# Patient Record
Sex: Male | Born: 1945 | Race: White | Hispanic: No | Marital: Married | Smoking: Former smoker
Health system: Southern US, Community
[De-identification: ages and names within clinical notes are randomized; demographics above are authoritative.]

## PROBLEM LIST (undated history)

## (undated) DIAGNOSIS — I639 Cerebral infarction, unspecified: Secondary | ICD-10-CM

## (undated) DIAGNOSIS — Z87891 Personal history of nicotine dependence: Secondary | ICD-10-CM

## (undated) DIAGNOSIS — I471 Supraventricular tachycardia, unspecified: Secondary | ICD-10-CM

## (undated) DIAGNOSIS — I10 Essential (primary) hypertension: Secondary | ICD-10-CM

## (undated) DIAGNOSIS — R001 Bradycardia, unspecified: Secondary | ICD-10-CM

## (undated) HISTORY — DX: Supraventricular tachycardia: I47.1

## (undated) HISTORY — DX: Essential (primary) hypertension: I10

## (undated) HISTORY — PX: COLONOSCOPY: SHX174

## (undated) HISTORY — PX: TONSILLECTOMY: SUR1361

## (undated) HISTORY — DX: Supraventricular tachycardia, unspecified: I47.10

## (undated) HISTORY — DX: Personal history of nicotine dependence: Z87.891

## (undated) HISTORY — DX: Bradycardia, unspecified: R00.1

---

## 1997-12-29 ENCOUNTER — Ambulatory Visit (HOSPITAL_COMMUNITY): Admission: RE | Admit: 1997-12-29 | Discharge: 1997-12-29 | Payer: Self-pay | Admitting: *Deleted

## 2003-10-23 ENCOUNTER — Ambulatory Visit (HOSPITAL_COMMUNITY): Admission: RE | Admit: 2003-10-23 | Discharge: 2003-10-23 | Payer: Self-pay | Admitting: Internal Medicine

## 2004-05-23 ENCOUNTER — Ambulatory Visit: Payer: Self-pay | Admitting: Family Medicine

## 2004-10-10 ENCOUNTER — Ambulatory Visit: Payer: Self-pay | Admitting: Family Medicine

## 2006-01-02 ENCOUNTER — Emergency Department (HOSPITAL_COMMUNITY): Admission: EM | Admit: 2006-01-02 | Discharge: 2006-01-02 | Payer: Self-pay | Admitting: Emergency Medicine

## 2006-01-05 ENCOUNTER — Ambulatory Visit (HOSPITAL_COMMUNITY): Payer: Self-pay | Admitting: Emergency Medicine

## 2006-01-05 ENCOUNTER — Encounter (HOSPITAL_COMMUNITY): Admission: RE | Admit: 2006-01-05 | Discharge: 2006-02-04 | Payer: Self-pay | Admitting: Emergency Medicine

## 2009-12-31 ENCOUNTER — Encounter: Payer: Self-pay | Admitting: Internal Medicine

## 2010-01-25 ENCOUNTER — Ambulatory Visit (HOSPITAL_COMMUNITY): Admission: RE | Admit: 2010-01-25 | Discharge: 2010-01-25 | Payer: Self-pay | Admitting: Internal Medicine

## 2010-01-25 ENCOUNTER — Ambulatory Visit: Payer: Self-pay | Admitting: Internal Medicine

## 2010-01-31 ENCOUNTER — Encounter: Payer: Self-pay | Admitting: Internal Medicine

## 2010-08-20 NOTE — Letter (Signed)
Summary: Patient Notice, Colon Biopsy Results  [facility]  [address]   [place], [place] [address]   Phone: [phone]  Fax: [phone]       January 31, 2010   Stephen Hull [address] [place], [place]  [address] [birth year]    Dear Stephen Hull,  I am pleased to inform you that the biopsies taken during your recent colonoscopy did not show any evidence of cancer upon pathologic examination.  Additional information/recommendations:  You should have a repeat colonoscopy examination  in 5 years.  Please call us if you are having persistent problems or have questions about your condition that have not been fully answered at this time.  Sincerely,    R. Roetta Sessions MD, FACP [facility] Ph: [phone]    Fax: [phone]   Appended Document: Patient Notice, Colon Biopsy Results Letter mailed to pt.  Appended Document: Patient Notice, Colon Biopsy Results reminder appt made and op & path faxed   Appended Document: Patient Notice, Colon Biopsy Results reminder in computer

## 2010-08-20 NOTE — Letter (Signed)
Summary: TRIAGE ORDER  TRIAGE ORDER   Imported By: Ave Filter 12/31/2009 12:03:53  _____________________________________________________________________  External Attachment:    Type:   Image     Comment:   External Document

## 2010-12-06 NOTE — Op Note (Signed)
NAME:  Stephen Hull, Stephen Hull                       ACCOUNT NO.:  [id number]   MEDICAL RECORD NO.:  [id number]                   PATIENT TYPE:  AMB   LOCATION:  DAY                                  FACILITY:  [facility]   PHYSICIAN:  Lionel December, M.D.                 DATE OF BIRTH:  30-Nov-1945   DATE OF PROCEDURE:  10/23/2003  DATE OF DISCHARGE:                                 OPERATIVE REPORT   PROCEDURE:  Total colonoscopy.   INDICATIONS FOR PROCEDURE:  Stephen Hull is a 65 year old Caucasian male with  history of colonic polyps whose last exam was about five and a half years  ago in [place].  He had a single polyp removed.  He is returning for  surveillance colonoscopy.  Family history is negative for colorectal  carcinoma, but his brother had a large adenoma removed from his sigmoid  colon on screening colonoscopy.  The procedure and risks were reviewed with  the patient, and informed consent was obtained.   PREOPERATIVE MEDICATIONS:  Demerol 50 mg IV, Versed 5 mg IV in divided dose.   FINDINGS:  The procedure was performed in the endoscopy suite.  The  patient's vital signs and O2 saturations were monitored during the procedure  and remained stable.  The patient was placed in the left lateral recumbent  position and rectal examination performed.  No abnormality noted on external  or digital exam.  The Olympus videoscope was placed into the rectum and  advanced into the region of the sigmoid colon and beyond.  The preparation  was satisfactory.  The scope was passed to the cecum which was identified by  the appendiceal orifice and ileocecal valve.  Pictures were taken for the  record.  There was a tiny polyp at the cecum which was ablated by cold  biopsy.  The mucosa of the rest of the colon was normal, but the rectal  mucosa had another small polyp which was ablated by cold biopsy.  Both of  these polyps were submitted in one container.  The scope was retroflexed to  examine the anorectal  junction, and moderate-size hemorrhoids were noted  below the dentate line.  The endoscope was straightened and withdrawn.  The  patient tolerated the procedure well.   FINAL DIAGNOSES:  1. Two tiny polyps that were ablated by cold biopsy.  One was at the cecum     and another one at the rectum.  2. External hemorrhoids.   RECOMMENDATIONS:  1. I will be contacting the patient with the biopsy results.  2. He may resume ASA and other medications as before.  3. Citrucel one tablespoon full at bedtime which might help with his     constipation.  Otherwise, he may take Colace one to two tablets at     bedtime.      ___________________________________________  Lionel December, M.D.   NR/MEDQ  D:  10/23/2003  T:  10/23/2003  Job:  [id number]   cc:   Colon Flattery, D.O.  [address]  [place]  [place] [address]  Fax: [phone]

## 2012-04-10 ENCOUNTER — Other Ambulatory Visit: Payer: Self-pay

## 2015-01-02 ENCOUNTER — Encounter: Payer: Self-pay | Admitting: Internal Medicine

## 2015-06-26 ENCOUNTER — Ambulatory Visit: Payer: Medicare Other | Attending: Family Medicine | Admitting: Physical Therapy

## 2015-06-26 DIAGNOSIS — M791 Myalgia: Secondary | ICD-10-CM | POA: Insufficient documentation

## 2015-06-26 DIAGNOSIS — M79651 Pain in right thigh: Secondary | ICD-10-CM | POA: Diagnosis present

## 2015-06-26 DIAGNOSIS — M7918 Myalgia, other site: Secondary | ICD-10-CM

## 2015-06-26 NOTE — Therapy (Addendum)
[facility] [address] [place], [place], [address] Phone: [phone]   Fax:  [phone]  Physical Therapy Evaluation  Patient Details  Name: Stephen Hull MRN: [id number] Date of Birth: 08/08/1945 Referring Provider: Brent Burnett MD.  Encounter Date: 06/26/2015      PT End of Session - 06/26/15 1157    Visit Number 1   Number of Visits 12   Date for PT Re-Evaluation 08/07/15   PT Start Time 1115   PT Stop Time 1147   PT Time Calculation (min) 32 min   Activity Tolerance Patient tolerated treatment well;Patient limited by pain   Behavior During Therapy WFL for tasks assessed/performed      No past medical history on file.  No past surgical history on file.  There were no vitals filed for this visit.  Visit Diagnosis:  Right buttock pain - Plan: PT plan of care cert/re-cert  Right thigh pain - Plan: PT plan of care cert/re-cert      Subjective Assessment - 06/26/15 1141    Subjective My right buttock area hurts and my right foot goes numb.   Limitations Sitting   How long can you sit comfortably? 5-10 minutes.   Patient Stated Goals Get out of pain.  Get relief.   Currently in Pain? Yes   Pain Score 6    Pain Location Buttocks   Pain Orientation Right   Pain Descriptors / Indicators Aching;Throbbing   Pain Onset More than a month ago   Pain Frequency Constant   Aggravating Factors  Sitting.   Pain Relieving Factors Rest--lying down.            [facility] PT Assessment - 06/26/15 0001    Assessment   Medical Diagnosis Right leg pain.   Referring Provider Brent Burnett MD.   Onset Date/Surgical Date --  6 weeks.   Precautions   Precautions None   Restrictions   Weight Bearing Restrictions No   Balance Screen   Has the patient fallen in the past 6 months No   Has the patient had a decrease in activity level because of a fear of falling?  No   Is the patient reluctant to leave their home because of a  fear of falling?  No   Home Environment   Living Environment Private residence   Prior Function   Level of Independence Independent   Posture/Postural Control   Posture Comments Patient in obvious pain standing in some spinal flexion.   ROM / Strength   AROM / PROM / Strength AROM;Strength   AROM   Overall AROM Comments Spinal movements and transitory movements were performed very slowly and purposefully due to high pain-level.   Strength   Overall Strength Comments Normal LE strength.   Palpation   Palpation comment Patient c/o right buttock pain but it could not be palpated.  This quite possibly could be referred pain from the patient's lower lumbar region.   Special Tests    Special Tests Lumbar  Unable to elicit RT Achilles DTR after several attempts.   Lumbar Tests Straight Leg Raise   Straight Leg Raise   Findings Positive   Side  Right   Comment The patient c/o right foot numbness with a right SLR test.   Ambulation/Gait   Gait Comments The patient's gait is very antalgic with patient keeping right knee in full extension.                                  PT Long Term Goals - 06/26/15 1157    PT LONG TERM GOAL #1   Title Ind with HEP.   Time 6   Period Weeks   Status New   PT LONG TERM GOAL #2   Title Patient sit 30 minutes with pain not > 3/10.   Time 6   Period Weeks   Status New   PT LONG TERM GOAL #3   Title Eliminate right LE symptoms.   Time 6   Period Weeks   Status New   PT LONG TERM GOAL #4   Title Perform ADL's with pain not > 3/10.   Time 6   Period Weeks   Status New               Plan - 06/26/15 1144    Clinical Impression Statement The patient presents to outpatient physical therapy with a CC of right buttock pain.  He states that the pain will radiate down his right LE and the bottom of his foot goes numb.  His resting pain-level is a 6/10 but his pain can approach a 10/10 after prolonged sitting.  He has not had  an X-ray or an MRI at this point.  patient was about about any spinal pain.  He denies low back pain or injury to this area though he did see a chiropractor many years ago.   Pt will benefit from skilled therapeutic intervention in order to improve on the following deficits Decreased activity tolerance;Pain   Rehab Potential Fair   PT Frequency 2x / week   PT Duration 6 weeks   PT Treatment/Interventions ADLs/Self Care Home Management;Electrical Stimulation;Moist Heat;Therapeutic exercise;Therapeutic activities;Traction;Ultrasound;Manual techniques   PT Next Visit Plan Discussed with patient that per assessment today his symptoms may likely be the result of a lower lumbar disorder.  Recommend he have further testing (ie:  X-ray; MRI) prior to commencing with physical therapy ie:  Spinal traction.          G-Codes - 06/26/15 1143    Functional Assessment Tool Used FOTO.   Functional Limitation Mobility: Walking and moving around   Mobility: Walking and Moving Around Current Status (G8978) At least 60 percent but less than 80 percent impaired, limited or restricted   Mobility: Walking and Moving Around Goal Status (G8979) At least 20 percent but less than 40 percent impaired, limited or restricted       Problem List There are no active problems to display for this patient.  PHYSICAL THERAPY DISCHARGE SUMMARY  Visits from Start of Care: 1.  Current functional level related to goals / functional outcomes: See above.   Remaining deficits: See below.   Education / Equipment:   Plan: Patient agrees to discharge.  Patient goals were not met. Patient is being discharged due to not returning since the last visit.  ?????      APPLEGATE, CHAD MPT 06/26/2015, 12:04 PM  [facility] [address] [place], [place], [address] Phone: [phone]   Fax:  [phone]  Name: Stephen Hull MRN: [id number] Date of Birth: 12/02/1945    

## 2016-07-07 ENCOUNTER — Ambulatory Visit: Payer: Medicare Other | Admitting: Cardiology

## 2016-07-23 ENCOUNTER — Encounter: Payer: Self-pay | Admitting: Cardiovascular Disease

## 2016-07-23 ENCOUNTER — Ambulatory Visit (INDEPENDENT_AMBULATORY_CARE_PROVIDER_SITE_OTHER): Payer: Medicare Other | Admitting: Cardiovascular Disease

## 2016-07-23 VITALS — BP 140/100 | HR 60 | Ht 71.0 in | Wt 236.0 lb

## 2016-07-23 DIAGNOSIS — R001 Bradycardia, unspecified: Secondary | ICD-10-CM | POA: Diagnosis not present

## 2016-07-23 DIAGNOSIS — I471 Supraventricular tachycardia: Secondary | ICD-10-CM | POA: Insufficient documentation

## 2016-07-23 DIAGNOSIS — I10 Essential (primary) hypertension: Secondary | ICD-10-CM | POA: Insufficient documentation

## 2016-07-23 NOTE — Assessment & Plan Note (Signed)
Asymptomatic bradycardia on Sectral. His heart rate during his recent PCP office visit was 36 although he was a symptomatic from this. He checks his vital signs at home frequently and has not noticed bradycardia. Owing to keep him on his beta blocker.

## 2016-07-23 NOTE — Assessment & Plan Note (Signed)
History of hypertension with blood pressure measured 140/100. He is on Sectral and Cozaar. He takes his blood pressure, and it's usually much less and less. Continue current meds at current dosing

## 2016-07-23 NOTE — Assessment & Plan Note (Signed)
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 He has not had symptomatic PSVT since.

## 2016-07-23 NOTE — Patient Instructions (Signed)

## 2016-07-23 NOTE — Progress Notes (Signed)
07/23/2016 [name]   1946/03/19  [id number]  Primary Physician No primary care provider on file. Primary Cardiologist: Runell Gess MD [name]  HPI:  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 He was referred by [name] PA-C for a symptomatically bradycardia. I last saw him in 1991 for PSVT. At that time I performed cardiac catheterization which was apparently normal and placed him on Sectral, beta blocker. I have not seen him back since. He does have a history of hypertension. He quit smoking in 1991 when I suggested that. He's lost 35 pounds in the last 6 months because of back problems. He denies chest pain or shortness of breath. He was noted to have a symptomatic bradycardia with a heart rate of 36 during a recent office visit with his PCP.   Current Outpatient Prescriptions  Medication Sig Dispense Refill  . acebutolol (SECTRAL) 200 MG capsule Take 1 capsule by mouth 2 (two) times daily.    [name] losartan (COZAAR) 50 MG tablet Take 1 tablet by mouth daily.    [name] omeprazole (PRILOSEC) 40 MG capsule Take 1 capsule by mouth daily.     No current facility-administered medications for this visit.     No Known Allergies  Social History   Social History  . Marital status: Married    Spouse name: N/A  . Number of children: N/A  . Years of education: N/A   Occupational History  . Not on file.   Social History Main Topics  . Smoking status: Former Smoker    Types: Cigarettes    Quit date: 07/21/1989  . Smokeless tobacco: Former Neurosurgeon    Quit date: 07/21/1989  . Alcohol use No  . Drug use: No  . Sexual activity: Not on file   Other Topics Concern  . Not on file   Social History Narrative  . No narrative on file     Review of Systems: General: negative for chills, fever, night sweats or weight  changes.  Cardiovascular: negative for chest pain, dyspnea on exertion, edema, orthopnea, palpitations, paroxysmal nocturnal dyspnea or shortness of breath Dermatological: negative for rash Respiratory: negative for cough or wheezing Urologic: negative for hematuria Abdominal: negative for nausea, vomiting, diarrhea, bright red blood per rectum, melena, or hematemesis Neurologic: negative for visual changes, syncope, or dizziness All other systems reviewed and are otherwise negative except as noted above.    Blood pressure (!) 140/100, pulse 60, height 5\' 11"  (1.803 m), weight 236 lb (107 kg).  General appearance: alert and no distress Neck: no adenopathy, no carotid bruit, no JVD, supple, symmetrical, trachea midline and thyroid not enlarged, symmetric, no tenderness/mass/nodules Lungs: clear to auscultation bilaterally Heart: regular rate and rhythm, S1, S2 normal, no murmur, click, rub or gallop Extremities: extremities normal, atraumatic, no cyanosis or edema  EKG sinus rhythm at 60 with ST or T-wave changes. I personally reviewed this EKG  ASSESSMENT AND PLAN:   Essential hypertension History of hypertension with blood pressure measured 140/100. He is on Sectral and Cozaar. He takes his blood pressure, and it's usually much less and less. 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 He has not had symptomatic PSVT since.  Bradycardia Asymptomatic bradycardia on Sectral. His heart  rate during his recent PCP office visit was 36 although he was a symptomatic from this. He checks his vital signs at home frequently and has not noticed bradycardia. Owing to keep him on his beta blocker.      Runell GessJonathan J. Shrinika Blatz MD FACP,FACC,FAHA, [facility] 07/23/2016 10:10 AM

## 2016-09-01 ENCOUNTER — Telehealth: Payer: Self-pay | Admitting: Internal Medicine

## 2016-09-01 NOTE — Telephone Encounter (Signed)
Pt called to set up colonoscopy. He was due in 2016. He isn't having any GI problems and with his insurance he can be triaged. Please call him at [phone]

## 2016-09-09 ENCOUNTER — Telehealth: Payer: Self-pay

## 2016-09-09 NOTE — Telephone Encounter (Signed)
Triaged today, but pt would like a call back when April schedule is available.

## 2016-09-09 NOTE — Telephone Encounter (Signed)
Call pt when April schedule is available.

## 2016-09-23 NOTE — Telephone Encounter (Signed)
Gastroenterology Pre-Procedure Review  Request Date: Requesting Physician:   PATIENT REVIEW QUESTIONS: The patient responded to the following health history questions as indicated:    1. Diabetes Melitis: NO 2. Joint replacements in the past 12 months: NO 3. Major health problems in the past 3 months: NO 4. Has an artificial valve or MVP: NO 5. Has a defibrillator: NO 6. Has been advised in past to take antibiotics in advance of a procedure like teeth cleaning: NO 7. Family history of colon cancer: NO  8. Alcohol Use: NO 9. History of sleep apnea: NO 10. History of coronary artery or other vascular stents placed within the last 12 months: NO    MEDICATIONS & ALLERGIES:    Patient reports the following regarding taking any blood thinners:   Plavix? NO Aspirin? NO Coumadin? NO Brilinta? NO Xarelto? NO Eliquis? NO Pradaxa? NO Savaysa? NO Effient? NO  Patient confirms/reports the following medications:  Current Outpatient Prescriptions  Medication Sig Dispense Refill  . acebutolol (SECTRAL) 200 MG capsule Take 1 capsule by mouth 2 (two) times daily.    [name]. losartan (COZAAR) 50 MG tablet Take 1 tablet by mouth daily.    [name]. omeprazole (PRILOSEC) 40 MG capsule Take 1 capsule by mouth daily.     No current facility-administered medications for this visit.     Patient confirms/reports the following allergies:  No Known Allergies  No orders of the defined types were placed in this encounter.   AUTHORIZATION INFORMATION Primary Insurance: [employer] [facility]  ID #: [id number],  Group #:  Pre-Cert / Auth required:  Pre-Cert / Auth #:   Secondary Insurance: MEDICARE,  ID #: [id number],  Group #:  Pre-Cert / Auth required:  Pre-Cert / Auth #:   SCHEDULE INFORMATION: Procedure has been scheduled as follows:  Date: , Time:   Location:    This Gastroenterology Pre-Precedure Review Form is being routed to the following provider(s): R. Roetta SessionsMichael Rourk, MD

## 2016-09-23 NOTE — Telephone Encounter (Signed)
LMOM to call back

## 2016-09-24 ENCOUNTER — Ambulatory Visit: Payer: Medicare Other | Admitting: Gastroenterology

## 2016-09-24 ENCOUNTER — Other Ambulatory Visit: Payer: Self-pay

## 2016-09-24 DIAGNOSIS — Z86010 Personal history of colonic polyps: Secondary | ICD-10-CM

## 2016-09-24 MED ORDER — PEG 3350-KCL-NA BICARB-NACL 420 G PO SOLR: 4000.0000 mL | ORAL | 0 refills | Status: DC

## 2016-09-24 NOTE — Telephone Encounter (Signed)
Appropriate.

## 2016-09-24 NOTE — Telephone Encounter (Addendum)
Pt is set up for TCS on 11/14/16 @ 7:30 am/ Instructions are in the mail.He is aware. NO PA is needed.

## 2016-09-24 NOTE — Telephone Encounter (Signed)
Forwarding to Ginger who triaged.  

## 2016-11-11 ENCOUNTER — Telehealth: Payer: Self-pay

## 2016-11-11 NOTE — Telephone Encounter (Signed)
Noted. Appropriate. Thanks!

## 2016-11-11 NOTE — Telephone Encounter (Addendum)
No change in his information for up coming TCS.

## 2016-11-14 ENCOUNTER — Encounter (HOSPITAL_COMMUNITY): Payer: Self-pay | Admitting: *Deleted

## 2016-11-14 ENCOUNTER — Encounter (HOSPITAL_COMMUNITY)
Admission: RE | Disposition: A | Discharge status: Home / Self Care | Payer: Self-pay | Source: Ambulatory Visit | Attending: Internal Medicine

## 2016-11-14 ENCOUNTER — Ambulatory Visit (HOSPITAL_COMMUNITY)
Admission: RE | Admit: 2016-11-14 | Discharge: 2016-11-14 | Disposition: A | Discharge status: Home / Self Care | Payer: Medicare Other | Source: Ambulatory Visit | Attending: Internal Medicine | Admitting: Internal Medicine

## 2016-11-14 DIAGNOSIS — Z86010 Personal history of colonic polyps: Secondary | ICD-10-CM | POA: Diagnosis not present

## 2016-11-14 DIAGNOSIS — Z87891 Personal history of nicotine dependence: Secondary | ICD-10-CM | POA: Insufficient documentation

## 2016-11-14 DIAGNOSIS — Z1211 Encounter for screening for malignant neoplasm of colon: Secondary | ICD-10-CM | POA: Diagnosis not present

## 2016-11-14 DIAGNOSIS — Z79899 Other long term (current) drug therapy: Secondary | ICD-10-CM | POA: Insufficient documentation

## 2016-11-14 DIAGNOSIS — I10 Essential (primary) hypertension: Secondary | ICD-10-CM | POA: Insufficient documentation

## 2016-11-14 DIAGNOSIS — K5730 Diverticulosis of large intestine without perforation or abscess without bleeding: Secondary | ICD-10-CM | POA: Insufficient documentation

## 2016-11-14 HISTORY — PX: COLONOSCOPY: SHX5424

## 2016-11-14 SURGERY — COLONOSCOPY
Anesthesia: Moderate Sedation

## 2016-11-14 MED ORDER — MIDAZOLAM HCL 5 MG/5ML IJ SOLN
INTRAMUSCULAR | Status: DC | PRN
  Administered 2016-11-14: 1 mg via INTRAVENOUS
  Administered 2016-11-14 (×2): 2 mg via INTRAVENOUS
  Administered 2016-11-14: 1 mg via INTRAVENOUS

## 2016-11-14 MED ORDER — SODIUM CHLORIDE 0.9 % IV SOLN
INTRAVENOUS | Status: DC
  Administered 2016-11-14: 06:00:00 via INTRAVENOUS

## 2016-11-14 MED ORDER — ONDANSETRON HCL 4 MG/2ML IJ SOLN
INTRAMUSCULAR | Status: AC
  Filled 2016-11-14: qty 2

## 2016-11-14 MED ORDER — MIDAZOLAM HCL 5 MG/5ML IJ SOLN
INTRAMUSCULAR | Status: AC
  Filled 2016-11-14: qty 10

## 2016-11-14 MED ORDER — ONDANSETRON HCL 4 MG/2ML IJ SOLN
INTRAMUSCULAR | Status: DC | PRN
  Administered 2016-11-14: 4 mg via INTRAVENOUS

## 2016-11-14 MED ORDER — MEPERIDINE HCL 100 MG/ML IJ SOLN
INTRAMUSCULAR | Status: AC
  Filled 2016-11-14: qty 2

## 2016-11-14 MED ORDER — MEPERIDINE HCL 100 MG/ML IJ SOLN
INTRAMUSCULAR | Status: DC | PRN
  Administered 2016-11-14: 50 mg via INTRAVENOUS
  Administered 2016-11-14: 25 mg via INTRAVENOUS

## 2016-11-14 MED ORDER — STERILE WATER FOR IRRIGATION IR SOLN
Status: DC | PRN
  Administered 2016-11-14: 07:00:00

## 2016-11-14 NOTE — H&P (Signed)
 @   Primary Care Physician:  Lilia Argue Primary Gastroenterologist:  Dr. Jena Gauss  Pre-Procedure History & Physical: HPI:  Stephen Hull is a 71 y.o. male here for surveillance tcs; hx mx adenomas removed 2011. No bowel sx.  Past Medical History:  Diagnosis Date  . Hypertension     Past Surgical History:  Procedure Laterality Date  . COLONOSCOPY    . TONSILLECTOMY     as a kid    Prior to Admission medications   Medication Sig Start Date End Date Taking? Authorizing Provider  acebutolol (SECTRAL) 200 MG capsule Take 1 capsule by mouth 2 (two) times daily. 06/18/16  Yes Historical Provider, MD  Cholecalciferol (VITAMIN D3) 2000 units TABS Take 2,000 Units by mouth daily.   Yes Historical Provider, MD  dextromethorphan-guaiFENesin (MUCINEX DM) 30-600 MG 12hr tablet Take 1 tablet by mouth daily as needed for cough.   Yes Historical Provider, MD  ibuprofen (ADVIL,MOTRIN) 200 MG tablet Take 400 mg by mouth every 6 (six) hours as needed for mild pain.   Yes Historical Provider, MD  losartan (COZAAR) 50 MG tablet Take 1 tablet by mouth daily. 06/18/16  Yes Historical Provider, MD  omeprazole (PRILOSEC) 40 MG capsule Take 1 capsule by mouth daily. 06/18/16  Yes Historical Provider, MD  polyethylene glycol-electrolytes (TRILYTE) 420 g solution Take 4,000 mLs by mouth as directed. Patient taking differently: Take 4,000 mLs by mouth as directed.  09/24/16  Yes Corbin Ade, MD    Allergies as of 09/24/2016  . (No Known Allergies)    Family History  Problem Relation Age of Onset  . Cancer Mother     Stomach  . Cancer - Prostate Father   . Heart attack Father   . Colon cancer Neg Hx     Social History   Social History  . Marital status: Married    Spouse name: N/A  . Number of children: N/A  . Years of education: N/A   Occupational History  . Not on file.   Social History Main Topics  . Smoking status: Former Smoker    Types: Cigarettes    Quit date:  07/21/1989  . Smokeless tobacco: Former Neurosurgeon    Quit date: 07/21/1989  . Alcohol use No  . Drug use: No  . Sexual activity: Not on file   Other Topics Concern  . Not on file   Social History Narrative  . No narrative on file    Review of Systems: See HPI, otherwise negative ROS  Physical Exam: BP 129/90   Pulse 67   Temp 97.7 F (36.5 C) (Oral)   Resp (!) 22   Ht  (1.778 m)   Wt 223 lb (101.2 kg)   SpO2 98%   BMI 32.00 kg/m  General:   Alert,  Well-developed, well-nourished, pleasant and cooperative in NAD SNeck:  Supple; no masses or thyromegaly. No significant cervical adenopathy. Lungs:  Clear throughout to auscultation.   No wheezes, crackles, or rhonchi. No acute distress. Heart:  Regular rate and rhythm; no murmurs, clicks, rubs,  or gallops. Abdomen: Non-distended, normal bowel sounds.  Soft and nontender without appreciable mass or hepatosplenomegaly.  Pulses:  Normal pulses noted. Extremities:  Without clubbing or edema.  Impression/ Plan:   Hx colonic adenoma; here for TCS.  The risks, benefits, limitations, alternatives and imponderables have been reviewed with the patient. Questions have been answered. All parties are agreeable.      Notice: This dictation was prepared with Reubin Milan  dictation along with smaller phrase technology. Any transcriptional errors that result from this process are unintentional and may not be corrected upon review.

## 2016-11-14 NOTE — Discharge Instructions (Addendum)
Colonoscopy Discharge Instructions  Read the instructions outlined below and refer to this sheet in the next few weeks. These discharge instructions provide you with general information on caring for yourself after you leave the hospital. Your doctor may also give you specific instructions. While your treatment has been planned according to the most current medical practices available, unavoidable complications occasionally occur. If you have any problems or questions after discharge, call Dr. Jena Gauss at [phone]. ACTIVITY  You may resume your regular activity, but move at a slower pace for the next 24 hours.   Take frequent rest periods for the next 24 hours.   Walking will help get rid of the air and reduce the bloated feeling in your belly (abdomen).   No driving for 24 hours (because of the medicine (anesthesia) used during the test).    Do not sign any important legal documents or operate any machinery for 24 hours (because of the anesthesia used during the test).  NUTRITION  Drink plenty of fluids.   You may resume your normal diet as instructed by your doctor.   Begin with a light meal and progress to your normal diet. Heavy or fried foods are harder to digest and may make you feel sick to your stomach (nauseated).   Avoid alcoholic beverages for 24 hours or as instructed.  MEDICATIONS  You may resume your normal medications unless your doctor tells you otherwise.  WHAT YOU CAN EXPECT TODAY  Some feelings of bloating in the abdomen.   Passage of more gas than usual.   Spotting of blood in your stool or on the toilet paper.  IF YOU HAD POLYPS REMOVED DURING THE COLONOSCOPY:  No aspirin products for 7 days or as instructed.   No alcohol for 7 days or as instructed.   Eat a soft diet for the next 24 hours.  FINDING OUT THE RESULTS OF YOUR TEST Not all test results are available during your visit. If your test results are not back during the visit, make an appointment  with your caregiver to find out the results. Do not assume everything is normal if you have not heard from your caregiver or the medical facility. It is important for you to follow up on all of your test results.  SEEK IMMEDIATE MEDICAL ATTENTION IF:  You have more than a spotting of blood in your stool.   Your belly is swollen (abdominal distention).   You are nauseated or vomiting.   You have a temperature over 101.   You have abdominal pain or discomfort that is severe or gets worse throughout the day.    Colon diverticulosis information provided  Recommend one more colonoscopy in 7 years if overall health permits.   Diverticulosis Diverticulosis is a condition that develops when small pouches (diverticula) form in the wall of the large intestine (colon). The colon is where water is absorbed and stool is formed. The pouches form when the inside layer of the colon pushes through weak spots in the outer layers of the colon. You may have a few pouches or many of them. What are the causes? The cause of this condition is not known. What increases the risk? The following factors may make you more likely to develop this condition:  Being older than age 28. Your risk for this condition increases with age. Diverticulosis is rare among people younger than age 9. By age 69, many people have it.  Eating a low-fiber diet.  Having frequent constipation.  Being overweight.  Not getting enough exercise.  Smoking.  Taking over-the-counter pain medicines, like aspirin and ibuprofen.  Having a family history of diverticulosis. What are the signs or symptoms? In most people, there are no symptoms of this condition. If you do have symptoms, they may include:  Bloating.  Cramps in the abdomen.  Constipation or diarrhea.  Pain in the lower left side of the abdomen. How is this diagnosed? This condition is most often diagnosed during an exam for other colon problems. Because  diverticulosis usually has no symptoms, it often cannot be diagnosed independently. This condition may be diagnosed by:  Using a flexible scope to examine the colon (colonoscopy).  Taking an X-ray of the colon after dye has been put into the colon (barium enema).  Doing a CT scan. How is this treated? You may not need treatment for this condition if you have never developed an infection related to diverticulosis. If you have had an infection before, treatment may include:  Eating a high-fiber diet. This may include eating more fruits, vegetables, and grains.  Taking a fiber supplement.  Taking a live bacteria supplement (probiotic).  Taking medicine to relax your colon.  Taking antibiotic medicines. Follow these instructions at home:  Drink 6-8 glasses of water or more each day to prevent constipation.  Try not to strain when you have a bowel movement.  If you have had an infection before:  Eat more fiber as directed by your health care provider or your diet and nutrition specialist (dietitian).  Take a fiber supplement or probiotic, if your health care provider approves.  Take over-the-counter and prescription medicines only as told by your health care provider.  If you were prescribed an antibiotic, take it as told by your health care provider. Do not stop taking the antibiotic even if you start to feel better.  Keep all follow-up visits as told by your health care provider. This is important. Contact a health care provider if:  You have pain in your abdomen.  You have bloating.  You have cramps.  You have not had a bowel movement in 3 days. Get help right away if:  Your pain gets worse.  Your bloating becomes very bad.  You have a fever or chills, and your symptoms suddenly get worse.  You vomit.  You have bowel movements that are bloody or black.  You have bleeding from your rectum. Summary  Diverticulosis is a condition that develops when small pouches  (diverticula) form in the wall of the large intestine (colon).  You may have a few pouches or many of them.  This condition is most often diagnosed during an exam for other colon problems.  If you have had an infection related to diverticulosis, treatment may include increasing the fiber in your diet, taking supplements, or taking medicines. This information is not intended to replace advice given to you by your health care provider. Make sure you discuss any questions you have with your health care provider. Document Released: 04/03/2004 Document Revised: 05/26/2016 Document Reviewed: 05/26/2016 Elsevier Interactive Patient Education  2017 [employer].

## 2016-11-14 NOTE — Op Note (Signed)
[facility] Patient Name: Stephen Hull Procedure Date: 11/14/2016 6:43 AM MRN: [id number] Date of Birth: 07-Jun-1946 Attending MD: Gennette Pac , MD CSN: [id number] Age: 71 Admit Type: Outpatient Procedure:                Colonoscopy Indications:              High risk colon cancer surveillance: Personal                            history of colonic polyps Providers:                Gennette Pac, MD, Loma Messing B. Mathis Fare RN, RN,                            Burke Keels, Technician Referring MD:              Medicines:                Midazolam 6 mg IV, Meperidine 75 mg IV, Ondansetron                            4 mg IV Complications:            No immediate complications. Estimated Blood Loss:     Estimated blood loss: none. Procedure:                Pre-Anesthesia Assessment:                           - Prior to the procedure, a History and Physical                            was performed, and patient medications and                            allergies were reviewed. The patient's tolerance of                            previous anesthesia was also reviewed. The risks                            and benefits of the procedure and the sedation                            options and risks were discussed with the patient.                            All questions were answered, and informed consent                            was obtained. Prior Anticoagulants: The patient has                            taken no previous anticoagulant or antiplatelet  agents. ASA Grade Assessment: II - A patient with                            mild systemic disease. After reviewing the risks                            and benefits, the patient was deemed in                            satisfactory condition to undergo the procedure.                           After obtaining informed consent, the colonoscope                            was passed under direct  vision. Throughout the                            procedure, the patient's blood pressure, pulse, and                            oxygen saturations were monitored continuously. The                            EC-[id number] ([id number]) scope was introduced through                            the anus and advanced to the the cecum, identified                            by appendiceal orifice and ileocecal valve. The                            ileocecal valve, appendiceal orifice, and rectum                            were photographed. The entire colon was well                            visualized. The patient tolerated the procedure                            well. The quality of the bowel preparation was                            adequate. Scope In: 7:05:47 AM Scope Out: 7:15:51 AM Scope Withdrawal Time: 0 hours 7 minutes 54 seconds  Total Procedure Duration: 0 hours 10 minutes 4 seconds  Findings:      The perianal and digital rectal examinations were normal.      Scattered small and large-mouthed diverticula were found in the sigmoid       colon and descending colon.      The exam was otherwise without abnormality on direct and retroflexion       views. Impression:               -  Diverticulosis in the sigmoid colon and in the                            descending colon.                           - The examination was otherwise normal on direct                            and retroflexion views.                           - No specimens collected. Moderate Sedation:      Moderate (conscious) sedation was administered by the endoscopy nurse       and supervised by the endoscopist. The following parameters were       monitored: oxygen saturation, heart rate, blood pressure, respiratory       rate, EKG, adequacy of pulmonary ventilation, and response to care.       Total physician intraservice time was 15 minutes. Recommendation:           - Patient has a contact number available for                             emergencies. The signs and symptoms of potential                            delayed complications were discussed with the                            patient. Return to normal activities tomorrow.                            Written discharge instructions were provided to the                            patient.                           - Resume previous diet.                           - Continue present medications.                           - Repeat colonoscopy in 7 years for surveillance if                            overall health permits.                           - Return to GI clinic as needed. Procedure Code(s):        --- Professional ---                           [phone], Colonoscopy, flexible; diagnostic, including  collection of specimen(s) by brushing or washing,                            when performed (separate procedure)                           99152, Moderate sedation services provided by the                            same physician or other qualified health care                            professional performing the diagnostic or                            therapeutic service that the sedation supports,                            requiring the presence of an independent trained                            observer to assist in the monitoring of the                            patient's level of consciousness and physiological                            status; initial 15 minutes of intraservice time,                            patient age 55 years or older Diagnosis Code(s):        --- Professional ---                           Z86.010, Personal history of colonic polyps                           K57.30, Diverticulosis of large intestine without                            perforation or abscess without bleeding CPT copyright 2016 American Medical Association. All rights reserved. The codes documented in this report are preliminary  and upon coder review may  be revised to meet current compliance requirements. [name], MD Gennette Pac, MD 11/14/2016 7:21:56 AM This report has been signed electronically. Number of Addenda: 0

## 2016-11-17 ENCOUNTER — Encounter (HOSPITAL_COMMUNITY): Payer: Self-pay | Admitting: Internal Medicine

## 2017-08-18 ENCOUNTER — Other Ambulatory Visit: Payer: Self-pay

## 2017-08-18 ENCOUNTER — Encounter: Payer: Self-pay | Admitting: Physical Therapy

## 2017-08-18 ENCOUNTER — Ambulatory Visit: Payer: Medicare Other | Attending: Physician Assistant | Admitting: Physical Therapy

## 2017-08-18 DIAGNOSIS — M79651 Pain in right thigh: Secondary | ICD-10-CM | POA: Insufficient documentation

## 2017-08-18 DIAGNOSIS — M7918 Myalgia, other site: Secondary | ICD-10-CM | POA: Diagnosis not present

## 2017-08-18 NOTE — Therapy (Signed)
[facility] [address] [place], [place], [address] Phone: [phone]   Fax:  [phone]  Physical Therapy Evaluation  Patient Details  Name: Stephen Hull MRN: [id number] Date of Birth: 04/02/1946 Referring Provider: Rueben Bash PA-C   Encounter Date: 08/18/2017  PT End of Session - 08/18/17 1351    Visit Number  1    Number of Visits  12    Date for PT Re-Evaluation  09/29/17    PT Start Time  1352    PT Stop Time  1432    PT Time Calculation (min)  40 min    Activity Tolerance  Patient tolerated treatment well    Behavior During Therapy  [facility] for tasks assessed/performed       Past Medical History:  Diagnosis Date  . Hypertension     Past Surgical History:  Procedure Laterality Date  . COLONOSCOPY    . COLONOSCOPY N/A 11/14/2016   Procedure: COLONOSCOPY;  Surgeon: Corbin Ade, MD;  Location: [facility];  Service: Endoscopy;  Laterality: N/A;  730   . TONSILLECTOMY     as a kid    There were no vitals filed for this visit.   Subjective Assessment - 08/18/17 1352    Subjective  Patient reports a long h/o stenosis and intermittent back pain with radiculopathy into the RLE. He fell getting out of a truck about a month ago and his symptoms flared up. Has been seeing chiropractor for this flare up but it's not helping.     Pertinent History  lumbar stenosis    How long can you stand comfortably?   10 min    Diagnostic tests  MRI 2017 showed the stenosis (central)    Currently in Pain?  Yes    Pain Score  6  avg 4/10    Pain Location  Hip    Pain Orientation  Right    Pain Descriptors / Indicators  Cramping    Pain Radiating Towards  to knee with some numbness in bottom of foot    Pain Onset  1 to 4 weeks ago    Pain Frequency  Intermittent    Aggravating Factors   standing    Pain Relieving Factors  sitting    Effect of Pain on Daily Activities  limited         [facility] PT Assessment - 08/18/17  0001      Assessment   Medical Diagnosis  R buttock pain and R thigh pain    Referring Provider  Rueben Bash PA-C    Onset Date/Surgical Date  07/21/17    Hand Dominance  Right    Next MD Visit  none      Precautions   Precautions  None      Restrictions   Weight Bearing Restrictions  No      Balance Screen   Has the patient fallen in the past 6 months  Yes    How many times?  1    Has the patient had a decrease in activity level because of a fear of falling?   No    Is the patient reluctant to leave their home because of a fear of falling?   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      [facility] Adult PT Treatment/Exercise - 08/18/17 0001      Modalities   Modalities  Traction      Traction   Type of Traction  Lumbar    Min (lbs)  20    Max (lbs)  80    Hold Time  99    Rest Time  5    Time  15             PT Education - 08/18/17 1425    Education provided  Yes    Education Details  TP DN    Person(s) Educated  Patient    Methods  Explanation;Handout    Comprehension  Verbalized understanding          PT Long Term Goals - 08/18/17 1447      PT LONG TERM GOAL #1   Title  Ind with HEP.    Time  6    Period  Weeks    Status  New    Target Date   09/29/17      PT LONG TERM GOAL #2   Title  Patient able to perform ADLs with pain not greater than 2/10 in RLE    Time  6    Period  Weeks    Status  New             Plan - 08/18/17 1426    Clinical Impression Statement  Patient presents for low complexity evaluation for R buttock and thigh pain limiting his standing and walking.  He has tight Bil HS and lower limb tension in his sciatic nerve on the R. He also has marked tenderness and trigger points throughout his R gluteus medius and max. Patient also has scoliosis. Patient will benefit from skilled PT to address these deficits.    Clinical Presentation  Stable    Clinical Decision Making  Low    Rehab Potential  Good    PT Frequency  2x / week    PT Duration  6 weeks    PT Treatment/Interventions  ADLs/Self Care Home Management;Cryotherapy;Electrical Stimulation;Moist Heat;Traction;Ultrasound;Therapeutic exercise;Neuromuscular re-education;Patient/family education;Manual techniques;Dry needling    PT Next Visit Plan  Assess traction and increase to 90# up to 115# as tolerated. STW manual therapy to R gluteals. Nerve glide to RLE. Stretching for scoliosis. Modalities prn.    Consulted and Agree with Plan of Care  Patient       Patient will benefit from skilled therapeutic intervention in order to improve the following deficits and impairments:  Decreased range of motion, Postural dysfunction, Impaired flexibility, Pain  Visit Diagnosis: Right buttock pain - Plan: PT plan of care cert/re-cert  Right thigh pain - Plan: PT plan of care cert/re-cert  Problem List Patient Active Problem List   Diagnosis Date Noted  . Essential hypertension 07/23/2016  . PSVT (paroxysmal supraventricular tachycardia) (HCC) 07/23/2016  . Bradycardia 07/23/2016    Solon PalmJulie Ahmet Schank PT 08/18/2017, 3:41 PM  [facility] [address] [place], [place], [address] Phone: [phone]   Fax:   [phone]  Name: Stephen Hull MRN: [id number] Date of Birth: 1946-05-21

## 2017-08-18 NOTE — Patient Instructions (Signed)
[facility]  DRY NEEDLING CONSENT FORM   Trigger point dry needling is a physical therapy approach to treat Myofascial Pain and Dysfunction.  Dry Needling (DN) is a valuable and effective way to deactivate myofascial trigger points (muscle knots). It is skilled intervention that uses a thin filiform needle to penetrate the skin and stimulate underlying myofascial trigger points, muscular, and connective tissues for the management of neuromusculoskeletal pain and movement impairments.  A local twitch response (LTR) will be elicited.  This can sometimes feel like a deep ache in the muscle during the procedure. Multiple trigger points in multiple muscles can be treated during each treatment.  No medication of any kind is injected.   As with any medical treatment and procedure, there are possible adverse events.  While significant adverse events are uncommon, they do sometimes occur and must be considered prior to giving consent.  1. Dry needling often causes a "post needling soreness".  There can be an increase in pain from a couple of hours to 2-3 days, followed by an improvement in the overall pain state. 2. Any time a needle is used there is a risk of infection.  However, we are using new, sterile, and disposable needles; infections are extremely rare. 3. There is a possibility that you may bleed or bruise.  You may feel tired and some nausea following treatment. 4. There is a rare possibility of a pneumothorax (air in the chest cavity). 5. Allergic reaction to nickel in the stainless steel needle. 6. If a nerve is touched, it may cause paresthesia (a prickling/shock sensation) which is usually brief, but may continue for a couple of days.  Following treatment stay hydrated.  Continue regular activities but not too vigorous initially after treatment for 24-48 hours. You may apply heat to sore muscles.  Dry Needling is best when combined with other physical therapy  interventions such as strengthening, stretching and other therapeutic modalities.     PLEASE ANSWER THE FOLLOWING QUESTIONS:  Do you have a lack of sensation?   Y/N  Do you have a phobia or fear of needles  Y/N  Are you pregnant?    Y/N If yes:  How many weeks? _____  Do you have any implanted devices?  Y/N If yes:  Pacemaker/Spinal Cord         Stimulator/Deep Brain         Stimulator/Insulin          Pump/Other: ____________ Do you have any implants?   Y/N If yes:      Do you take any blood thinners?   Y/N If yes: Coumadin          (Warfarin)/Other:  Do you have a bleeding disorder?   Y/N If yes: What kind:   Do you take any immunosuppressants?  Y/N If yes:   What kind:   Do you take anti-inflammatories?   Y/N If yes: What kind:  Have you ever been diagnosed with Scoliosis? Y/N  Have you had back surgery?    Y/N If yes:         Laminectomy/Fusion/Other:   I have read, or had read to me, the above.  I have had the opportunity to ask any questions.  All of my questions have been answered to my satisfaction and I understand the risks involved with dry needling.  I consent to examination and treatment at [facility], including dry needling, of any and all of my involved and affected   muscles.    Solon PalmJulie Claron Hull, PT 08/18/17 2:25 PM [facility] [address] [place], [place], [address] Phone: [phone]   Fax:  [phone]

## 2017-08-20 ENCOUNTER — Ambulatory Visit: Payer: Medicare Other | Admitting: Physical Therapy

## 2017-08-20 ENCOUNTER — Encounter: Payer: Self-pay | Admitting: Physical Therapy

## 2017-08-20 DIAGNOSIS — M7918 Myalgia, other site: Secondary | ICD-10-CM | POA: Diagnosis not present

## 2017-08-20 DIAGNOSIS — M79651 Pain in right thigh: Secondary | ICD-10-CM

## 2017-08-20 NOTE — Therapy (Signed)
[facility] [address] [place], [place], [address] Phone: [phone]   Fax:  [phone]  Physical Therapy Treatment  Patient Details  Name: Stephen Hull MRN: [id number] Date of Birth: 12/16/1945 Referring Provider: Rueben Bash PA-C   Encounter Date: 08/20/2017  PT End of Session - 08/20/17 1825    Visit Number  2    Number of Visits  12    Date for PT Re-Evaluation  09/29/17    PT Start Time  0230    PT Stop Time  0319    PT Time Calculation (min)  49 min    Activity Tolerance  Patient tolerated treatment well    Behavior During Therapy  [facility] for tasks assessed/performed       Past Medical History:  Diagnosis Date  . Hypertension     Past Surgical History:  Procedure Laterality Date  . COLONOSCOPY    . COLONOSCOPY N/A 11/14/2016   Procedure: COLONOSCOPY;  Surgeon: Corbin Ade, MD;  [facility];  Service: Endoscopy;  Laterality: N/A;  730   . TONSILLECTOMY     as a kid    There were no vitals filed for this visit.  Subjective Assessment - 08/20/17 1826    Subjective  I did well with the traction but I need more weight.    Diagnostic tests  MRI 2017 showed the stenosis (central)    Pain Score  6     Pain Location  Hip    Pain Orientation  Right                      [facility] Adult PT Treatment/Exercise - 08/20/17 0001      Modalities   Modalities  Electrical Stimulation;Moist Heat;Traction      Moist Heat Therapy   Number Minutes Moist Heat  15 Minutes    Moist Heat Location  Lumbar Spine      Electrical Stimulation   Electrical Stimulation Location  Right SIJ/upper gluteal region.    Electrical Stimulation Action  Pre-mod.    Electrical Stimulation Parameters  80-150 Hz x 15 minutes.    Electrical Stimulation Goals  Pain      Traction   Type of Traction  Lumbar    Min (lbs)  45    Max (lbs)  90    Hold Time  99    Rest Time  5    Time  15      Manual Therapy   Manual Therapy  Soft tissue mobilization    Manual therapy comments  in left sdly position:  STW/M x 8 minutes with work on right quadratus lumborum to reduce tautness.  Also worked on SIJ ligaments and upper aspect of right gluteus medius.                  PT Long Term Goals - 08/18/17 1447      PT LONG TERM GOAL #1   Title  Ind with HEP.    Time  6    Period  Weeks    Status  New    Target Date  09/29/17      PT LONG TERM GOAL #2   Title  Patient able to perform ADLs with pain not greater than 2/10 in RLE    Time  6    Period  Weeks    Status  New              Patient  will benefit from skilled therapeutic intervention in order to improve the following deficits and impairments:     Visit Diagnosis: Right buttock pain  Right thigh pain     Problem List Patient Active Problem List   Diagnosis Date Noted  . Essential hypertension 07/23/2016  . PSVT (paroxysmal supraventricular tachycardia) (HCC) 07/23/2016  . Bradycardia 07/23/2016    Tashira Torre, [place] MPT 08/20/2017, 6:37 PM  [facility] [address] [place], [place], [address] Phone: [phone]   Fax:  [phone]  Name: Stephen Hull MRN: [id number] Date of Birth: [birth year]

## 2017-08-25 ENCOUNTER — Ambulatory Visit: Payer: Medicare Other | Attending: Physician Assistant | Admitting: Physical Therapy

## 2017-08-25 ENCOUNTER — Encounter: Payer: Self-pay | Admitting: Physical Therapy

## 2017-08-25 DIAGNOSIS — M79651 Pain in right thigh: Secondary | ICD-10-CM | POA: Diagnosis present

## 2017-08-25 DIAGNOSIS — M7918 Myalgia, other site: Secondary | ICD-10-CM | POA: Diagnosis not present

## 2017-08-25 NOTE — Therapy (Signed)
[facility] [address] [place], [place], [address] Phone: [phone]   Fax:  [phone]  Physical Therapy Treatment  Patient Details  Name: Stephen Hull MRN: [id number] Date of Birth: Aug 22, 1945 Referring Provider: Rueben BashBreejante Williams PA-C   Encounter Date: 08/25/2017  PT End of Session - 08/25/17 1548    Visit Number  3    Number of Visits  12    Date for PT Re-Evaluation  09/29/17    PT Start Time  1519    PT Stop Time  1615    PT Time Calculation (min)  56 min    Activity Tolerance  Patient tolerated treatment well    Behavior During Therapy  [facility] for tasks assessed/performed       Past Medical History:  Diagnosis Date  . Hypertension     Past Surgical History:  Procedure Laterality Date  . COLONOSCOPY    . COLONOSCOPY N/A 11/14/2016   Procedure: COLONOSCOPY;  Surgeon: Corbin Adeobert M Rourk, MD;  [facility];  Service: Endoscopy;  Laterality: N/A;  730   . TONSILLECTOMY     as a kid    There were no vitals filed for this visit.  Subjective Assessment - 08/25/17 1518    Subjective  Reports that his pain is mostly in his leg.    Pertinent History  lumbar stenosis    How long can you stand comfortably?   10 min    Diagnostic tests  MRI 2017 showed the stenosis (central)    Currently in Pain?  Yes    Pain Score  6     Pain Location  Leg    Pain Orientation  Right;Posterior;Proximal    Pain Type  Chronic pain    Pain Onset  1 to 4 weeks ago    Pain Frequency  Intermittent                      [facility] Adult PT Treatment/Exercise - 08/25/17 0001      Modalities   Modalities  Electrical Stimulation;Moist Heat;Ultrasound;Traction      Moist Heat Therapy   Number Minutes Moist Heat  15 Minutes    Moist Heat Location  Lumbar Spine      Electrical Stimulation   Electrical Stimulation Location  Right SIJ/upper gluteal region.    Electrical Stimulation Action  Pre-Mod    Electrical  Stimulation Parameters  80-150 hz x15 min    Electrical Stimulation Goals  Pain      Ultrasound   Ultrasound Location  R SI joint    Ultrasound Parameters  1.5 w/cm2, 100%, 3 mhz x10 min    Ultrasound Goals  Pain      Traction   Type of Traction  Lumbar    Min (lbs)  5    Max (lbs)  100    Hold Time  99    Rest Time  5    Time  15      Manual Therapy   Manual Therapy  Soft tissue mobilization    Soft tissue mobilization  STW to R SI joint in L SL position to reduce pain and muscle tightness surrounding SI joint.                  PT Long Term Goals - 08/18/17 1447      PT LONG TERM GOAL #1   Title  Ind with HEP.    Time  6    Period  Weeks    Status  New    Target Date  09/29/17      PT LONG TERM GOAL #2   Title  Patient able to perform ADLs with pain not greater than 2/10 in RLE    Time  6    Period  Weeks    Status  New            Plan - 08/25/17 1546    Clinical Impression Statement  Patient continues to report RLE pain along route of R HS upon arrival. Patient's pain is most prominent after standing for prolonged period. Patient still palpably tender over the R SI joint. Normal modalities response noted following removal of the modalities. Traction increased to 100# max per [place], MPT instructions. No complaints of pain following end of treatment today from patient.    Rehab Potential  Good    PT Frequency  2x / week    PT Duration  6 weeks    PT Treatment/Interventions  ADLs/Self Care Home Management;Cryotherapy;Electrical Stimulation;Moist Heat;Traction;Ultrasound;Therapeutic exercise;Neuromuscular re-education;Patient/family education;Manual techniques;Dry needling    PT Next Visit Plan  Assess traction and increase to 90# up to 115# as tolerated. STW manual therapy to R gluteals. Nerve glide to RLE. Stretching for scoliosis. Modalities prn.    Consulted and Agree with Plan of Care  Patient       Patient will benefit from skilled  therapeutic intervention in order to improve the following deficits and impairments:  Decreased range of motion, Postural dysfunction, Impaired flexibility, Pain  Visit Diagnosis: Right buttock pain  Right thigh pain     Problem List Patient Active Problem List   Diagnosis Date Noted  . Essential hypertension 07/23/2016  . PSVT (paroxysmal supraventricular tachycardia) (HCC) 07/23/2016  . Bradycardia 07/23/2016    Marvell Fuller, PTA 08/25/2017, 4:51 PM  [facility] [address] [place], [place], [address] Phone: [phone]   Fax:  [phone]  Name: Stephen Hull MRN: [id number] Date of Birth: [birth year]

## 2017-08-27 ENCOUNTER — Encounter: Payer: Self-pay | Admitting: Physical Therapy

## 2017-08-27 ENCOUNTER — Ambulatory Visit: Payer: Medicare Other | Admitting: Physical Therapy

## 2017-08-27 DIAGNOSIS — M7918 Myalgia, other site: Secondary | ICD-10-CM | POA: Diagnosis not present

## 2017-08-27 DIAGNOSIS — M79651 Pain in right thigh: Secondary | ICD-10-CM

## 2017-08-27 NOTE — Therapy (Signed)
[facility] [address] [place], [place], [address] Phone: [phone]   Fax:  [phone]  Physical Therapy Treatment  Patient Details  Name: Stephen Hull MRN: [id number] Date of Birth: [birth year] Referring Provider: Rueben BashBreejante Williams   Encounter Date: 08/27/2017  PT End of Session - 08/27/17 1210    Visit Number  4    Number of Visits  12    Date for PT Re-Evaluation  09/29/17    PT Start Time  0900    PT Stop Time  0955    PT Time Calculation (min)  55 min    Activity Tolerance  Patient tolerated treatment well    Behavior During Therapy  [facility] for [facility] assessed/performed       Past Medical History:  Diagnosis Date  . Hypertension     Past Surgical History:  Procedure Laterality Date  . COLONOSCOPY    . COLONOSCOPY N/A 11/14/2016   Procedure: COLONOSCOPY;  Surgeon: Corbin Adeobert M Rourk, MD;  [facility];  Service: Endoscopy;  Laterality: N/A;  730   . TONSILLECTOMY     as a kid    There were no vitals filed for this visit.  Subjective Assessment - 08/27/17 0954    Subjective  Pt reporting pain of 2/10 in his low back radiating down his R hamstrings and side of leg.     Pertinent History  lumbar stenosis    How long can you stand comfortably?   10 min    Diagnostic tests  MRI 2017 showed the stenosis (central)    Currently in Pain?  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    Pain Relieving Factors  siting         [facility] - 08/27/17 0001      Assessment   Medical Diagnosis  R buttock pain and R thigh pain    Referring Provider  Rueben BashBreejante Williams    Onset Date/Surgical Date  07/21/17    Hand Dominance  Right    Next MD Visit  none      Balance  Screen   Has the patient fallen in the past 6 months  -- none since last visit                  [facility] Adult PT Treatment/Exercise - 08/27/17 0001      Modalities   Modalities  Electrical Stimulation;Moist Heat;Ultrasound;Traction      Moist Heat Therapy   Number Minutes Moist Heat  10 Minutes    Moist Heat Location  Lumbar Spine      Electrical Stimulation   Electrical Stimulation Location  Right SIJ/upper gluteal region.    Electrical Stimulation Action  pre-mod    Electrical Stimulation Parameters  80-150 Hz x 15 minutes    Electrical Stimulation Goals  Pain      Traction   Type of Traction  Lumbar    Min (lbs)  5    Max (lbs)  100    Hold Time  99    Rest Time  5    Time  15  Manual Therapy   Manual Therapy  Soft tissue mobilization    Soft tissue mobilization  STW to R SI joint in L SL position to reduce pain and muscle tightness surrounding SI joint., STW to QL  and down IT band x 25 minutes                  PT Long Term Goals - 08/27/17 1213      PT LONG TERM GOAL #1   Title  Ind with HEP.    Status  On-going      PT LONG TERM GOAL #2   Title  Patient able to perform ADLs with pain not greater than 2/10 in RLE    Status  On-going      PT LONG TERM GOAL #3   Title  Eliminate right LE symptoms.    Status  On-going      PT LONG TERM GOAL #4   Title  Perform ADL's with pain not > 3/10.    Status  On-going              Patient will benefit from skilled therapeutic intervention in order to improve the following deficits and impairments:     Visit Diagnosis: Right buttock pain  Right thigh pain     Problem List Patient Active Problem List   Diagnosis Date Noted  . Essential hypertension 07/23/2016  . PSVT (paroxysmal supraventricular tachycardia) (HCC) 07/23/2016  . Bradycardia 07/23/2016    Sharmon Leyden, MPT 08/27/2017, 12:18 PM  [facility] [address] [place], [place], [address] Phone: [phone]   Fax:  [phone]  Name: Stephen Hull MRN: [id number] Date of Birth: April 16, 1946

## 2017-09-01 ENCOUNTER — Encounter: Payer: Medicare Other | Admitting: Physical Therapy

## 2017-09-03 ENCOUNTER — Ambulatory Visit: Payer: Medicare Other | Admitting: Physical Therapy

## 2017-09-03 ENCOUNTER — Encounter: Payer: Self-pay | Admitting: Physical Therapy

## 2017-09-03 DIAGNOSIS — M79651 Pain in right thigh: Secondary | ICD-10-CM

## 2017-09-03 DIAGNOSIS — M7918 Myalgia, other site: Secondary | ICD-10-CM

## 2017-09-03 NOTE — Therapy (Signed)
[facility] [address] [place], [place], [address] Phone: [phone]   Fax:  [phone]  Physical Therapy Treatment  Patient Details  Name: Stephen Hull MRN: [id number] Date of Birth: [birth year] Referring Provider: Rueben Bash   Encounter Date: 09/03/2017  PT End of Session - 09/03/17 0734    Visit Number  5    Number of Visits  12    Date for PT Re-Evaluation  09/29/17    PT Start Time  0732    PT Stop Time  0830    PT Time Calculation (min)  58 min    Activity Tolerance  Patient tolerated treatment well    Behavior During Therapy  [facility] for tasks assessed/performed       Past Medical History:  Diagnosis Date  . Hypertension     Past Surgical History:  Procedure Laterality Date  . COLONOSCOPY    . COLONOSCOPY N/A 11/14/2016   Procedure: COLONOSCOPY;  Surgeon: Corbin Ade, MD;  [facility];  Service: Endoscopy;  Laterality: N/A;  730   . TONSILLECTOMY     as a kid    There were no vitals filed for this visit.  Subjective Assessment - 09/03/17 0732    Subjective  Reports that his RLE is still hurting him and reports more pain when he gets out from driving more. Reports that his RLE feels as if he has a pulled muscle. Reports that he recently bought an inversion table and has been able to use it a few times.    Pertinent History  lumbar stenosis    How long can you stand comfortably?   10 min    Diagnostic tests  MRI 2017 showed the stenosis (central)    Currently in Pain?  Yes    Pain Score  3     Pain Location  Leg    Pain Orientation  Right    Pain Descriptors / Indicators  Aching    Pain Type  Chronic pain    Pain Onset  1 to 4 weeks ago         [facility] PT Assessment - 09/03/17 0001      Assessment   Medical Diagnosis  R buttock pain and R thigh pain    Onset Date/Surgical Date  07/21/17    Hand Dominance  Right    Next MD Visit  none      Precautions   Precautions  None      Restrictions   Weight Bearing Restrictions  No                  [facility] Adult PT Treatment/Exercise - 09/03/17 0001      Exercises   Exercises  Lumbar      Lumbar Exercises: Aerobic   Nustep  L5 x7 min LEs only      Lumbar Exercises: Supine   Bridge  15 reps;3 seconds    Straight Leg Raise  10 reps;2 seconds      Lumbar Exercises: Sidelying   Clam  Both;15 reps      Modalities   Modalities  Electrical Stimulation;Moist Heat;Traction      Moist Heat Therapy   Number Minutes Moist Heat  15 Minutes    Moist Heat Location  Lumbar Spine      Electrical Stimulation   Electrical Stimulation Location  B low back/ SI joint    Electrical Stimulation Action  Pre-Mod    Electrical Stimulation Parameters  80-150  hz x15 min    Electrical Stimulation Goals  Pain      Traction   Type of Traction  Lumbar    Min (lbs)  5    Max (lbs)  100    Hold Time  99    Rest Time  5    Time  15             PT Education - 09/03/17 0759    Education provided  Yes    Education Details  HEP- bridge, SLR, SL clam    Person(s) Educated  Patient    Methods  Explanation;Handout    Comprehension  Verbalized understanding          PT Long Term Goals - 08/27/17 1213      PT LONG TERM GOAL #1   Title  Ind with HEP.    Status  On-going      PT LONG TERM GOAL #2   Title  Patient able to perform ADLs with pain not greater than 2/10 in RLE    Status  On-going      PT LONG TERM GOAL #3   Title  Eliminate right LE symptoms.    Status  On-going      PT LONG TERM GOAL #4   Title  Perform ADL's with pain not > 3/10.    Status  On-going            Plan - 09/03/17 0818    Clinical Impression Statement  Patient continues to experience RLE discomfort and aching upon standing. Patient guided through low level core and pelvic strengthening exercises without complaint of pain. Patient VC'd for exercise technique as well as proper core activation using more VCs for PPT. Patient  provided HEP for low level core and pelvic strengthening exercises with patient verbalizing understanding of instruction. Normal modalities response noted following both stimulation as well as traction. Patient educated regarding use of heat and ice for 15-20 minutes at a time depending on his preference.       Rehab Potential  Good    PT Frequency  2x / week    PT Duration  6 weeks    PT Treatment/Interventions  ADLs/Self Care Home Management;Cryotherapy;Electrical Stimulation;Moist Heat;Traction;Ultrasound;Therapeutic exercise;Neuromuscular re-education;Patient/family education;Manual techniques;Dry needling    PT Next Visit Plan  Assess traction and increase to 90# up to 115# as tolerated. STW manual therapy to R gluteals. Nerve glide to RLE. Stretching for scoliosis. Modalities prn.    Consulted and Agree with Plan of Care  Patient       Patient will benefit from skilled therapeutic intervention in order to improve the following deficits and impairments:  Decreased range of motion, Postural dysfunction, Impaired flexibility, Pain  Visit Diagnosis: Right buttock pain  Right thigh pain     Problem List Patient Active Problem List   Diagnosis Date Noted  . Essential hypertension 07/23/2016  . PSVT (paroxysmal supraventricular tachycardia) (HCC) 07/23/2016  . Bradycardia 07/23/2016    Marvell FullerKelsey P Demetris Meinhardt, PTA 09/03/2017, 8:34 AM  [facility] [address] [place], [place], [address] Phone: [phone]   Fax:  [phone]  Name: Marcelyn BruinsRonald G Llamas MRN: [id number] Date of Birth: [birth year]

## 2017-09-07 ENCOUNTER — Encounter: Payer: Self-pay | Admitting: Physical Therapy

## 2017-09-07 ENCOUNTER — Ambulatory Visit: Payer: Medicare Other | Admitting: Physical Therapy

## 2017-09-07 DIAGNOSIS — M79651 Pain in right thigh: Secondary | ICD-10-CM

## 2017-09-07 DIAGNOSIS — M7918 Myalgia, other site: Secondary | ICD-10-CM

## 2017-09-07 NOTE — Therapy (Signed)
[facility] [address] [place], [place], [address] Phone: [phone]   Fax:  [phone]  Physical Therapy Treatment  Patient Details  Name: Stephen Hull MRN: [id number] Date of Birth: 04/01/1946 Referring Provider: Rueben Bash   Encounter Date: 09/07/2017  PT End of Session - 09/07/17 1526    Visit Number  6    Number of Visits  12    Date for PT Re-Evaluation  09/29/17    PT Start Time  1511    PT Stop Time  1605    PT Time Calculation (min)  54 min    Activity Tolerance  Patient tolerated treatment well    Behavior During Therapy  [facility] for tasks assessed/performed       Past Medical History:  Diagnosis Date  . Hypertension     Past Surgical History:  Procedure Laterality Date  . COLONOSCOPY    . COLONOSCOPY N/A 11/14/2016   Procedure: COLONOSCOPY;  Surgeon: Corbin Ade, MD;  [facility];  Service: Endoscopy;  Laterality: N/A;  730   . TONSILLECTOMY     as a kid    There were no vitals filed for this visit.  Subjective Assessment - 09/07/17 1512    Subjective  Reports that he is still having pain in RLE from R HS origin to attachment area. Patient also reports that he also has numbness along the plantar surface of the R foot.     Pertinent History  lumbar stenosis    How long can you stand comfortably?   10 min    Diagnostic tests  MRI 2017 showed the stenosis (central)    Currently in Pain?  Yes    Pain Score  5     Pain Location  Leg    Pain Orientation  Right;Posterior    Pain Descriptors / Indicators  Dull;Nagging    Pain Type  Chronic pain    Pain Radiating Towards  Numbness along plantar surface of R foot    Pain Onset  1 to 4 weeks ago         [facility] PT Assessment - 09/07/17 0001      Assessment   Medical Diagnosis  R buttock pain and R thigh pain    Onset Date/Surgical Date  07/21/17    Hand Dominance  Right    Next MD Visit  none      Precautions   Precautions  None       Restrictions   Weight Bearing Restrictions  No                  [facility] Adult PT Treatment/Exercise - 09/07/17 0001      Lumbar Exercises: Stretches   Passive Hamstring Stretch  Right;Left;3 reps;30 seconds with gait belt    ITB Stretch  Right;3 reps;30 seconds      Lumbar Exercises: Aerobic   Nustep  L5 x10 min LEs only      Lumbar Exercises: Supine   Bridge  20 reps;3 seconds      Lumbar Exercises: Sidelying   Clam  Both;20 reps      Modalities   Modalities  Traction      Traction   Type of Traction  Lumbar    Min (lbs)  5    Max (lbs)  105    Hold Time  99    Rest Time  5    Time  15      Manual Therapy  Manual Therapy  Soft tissue mobilization    Soft tissue mobilization  STW/TPR to R glute max and glute med in L SL to reduce TPR and muscle tightness                  PT Long Term Goals - 08/27/17 1213      PT LONG TERM GOAL #1   Title  Ind with HEP.    Status  On-going      PT LONG TERM GOAL #2   Title  Patient able to perform ADLs with pain not greater than 2/10 in RLE    Status  On-going      PT LONG TERM GOAL #3   Title  Eliminate right LE symptoms.    Status  On-going      PT LONG TERM GOAL #4   Title  Perform ADL's with pain not > 3/10.    Status  On-going            Plan - 09/07/17 1556    Clinical Impression Statement  Patient presented in clinic today with continued RLE discomfort along with R foot numbness along plantar surface. Patient completed R HS and ITB stretches with emphasis of the R lateral hip stretch reported by patient with ITB stretch. No discomfort reported by patient with bridging or SL clam for BLE. Patient presented with increased muscle tightness of the R superior glute max and tender to palpation to superior glute max. Increased max pull on traction increased to 105#. No complaints were provided from patient following end of traction session.    Rehab Potential  Good    PT Frequency  2x / week     PT Duration  6 weeks    PT Treatment/Interventions  ADLs/Self Care Home Management;Cryotherapy;Electrical Stimulation;Moist Heat;Traction;Ultrasound;Therapeutic exercise;Neuromuscular re-education;Patient/family education;Manual techniques;Dry needling    PT Next Visit Plan  Assess traction and increase to 90# up to 115# as tolerated. STW manual therapy to R gluteals. Nerve glide to RLE. Stretching for scoliosis. Modalities prn.    Consulted and Agree with Plan of Care  Patient       Patient will benefit from skilled therapeutic intervention in order to improve the following deficits and impairments:  Decreased range of motion, Postural dysfunction, Impaired flexibility, Pain  Visit Diagnosis: Right buttock pain  Right thigh pain     Problem List Patient Active Problem List   Diagnosis Date Noted  . Essential hypertension 07/23/2016  . PSVT (paroxysmal supraventricular tachycardia) (HCC) 07/23/2016  . Bradycardia 07/23/2016    Marvell FullerKelsey P Naszir Cott, PTA 09/07/2017, 4:09 PM  [facility] [address] [place], [place], [address] Phone: [phone]   Fax:  [phone]  Name: Stephen Hull MRN: [id number] Date of Birth: 1945/12/10

## 2017-09-08 ENCOUNTER — Encounter: Payer: Medicare Other | Admitting: Physical Therapy

## 2017-09-10 ENCOUNTER — Ambulatory Visit: Payer: Medicare Other | Admitting: Physical Therapy

## 2017-09-10 DIAGNOSIS — M79651 Pain in right thigh: Secondary | ICD-10-CM

## 2017-09-10 DIAGNOSIS — M7918 Myalgia, other site: Secondary | ICD-10-CM | POA: Diagnosis not present

## 2017-09-10 NOTE — Therapy (Signed)
[facility] [address] [place], [place], [address] Phone: [phone]   Fax:  [phone]  Physical Therapy Treatment  Patient Details  Name: Stephen Hull MRN: [id number] Date of Birth: 12/28/1945 Referring Provider: Rueben BashBreejante Williams   Encounter Date: 09/10/2017  PT End of Session - 09/10/17 1520    Visit Number  7    Number of Visits  12    Date for PT Re-Evaluation  09/29/17    PT Start Time  1520    PT Stop Time  1605    PT Time Calculation (min)  45 min    Activity Tolerance  Patient tolerated treatment well    Behavior During Therapy  [name] for tasks assessed/performed       Past Medical History:  Diagnosis Date  . Hypertension     Past Surgical History:  Procedure Laterality Date  . COLONOSCOPY    . COLONOSCOPY N/A 11/14/2016   Procedure: COLONOSCOPY;  Surgeon: Corbin Adeobert M Rourk, MD;  [facility];  Service: Endoscopy;  Laterality: N/A;  730   . TONSILLECTOMY     as a kid    There were no vitals filed for this visit.  Subjective Assessment - 09/10/17 1519    Subjective  Reports the proximal R HS is very tender and reports that pain in the L foot is worsening. Reports tat even sitting he has pain now.    Pertinent History  lumbar stenosis    How long can you stand comfortably?   10 min    Diagnostic tests  MRI 2017 showed the stenosis (central)    Currently in Pain?  Yes    Pain Score  9     Pain Location  Leg    Pain Orientation  Right;Posterior    Pain Descriptors / Indicators  Discomfort;Numbness;Pins and needles    Pain Type  Chronic pain    Pain Onset  1 to 4 weeks ago         [facility] - 09/10/17 0001      Assessment   Medical Diagnosis  R buttock pain and R thigh pain    Onset Date/Surgical Date  07/21/17    Hand Dominance  Right    Next MD Visit  none      Precautions   Precautions  None      Restrictions   Weight Bearing Restrictions  No                   [facility] Adult PT Treatment/Exercise - 09/10/17 0001      Modalities   Modalities  Electrical Stimulation;Moist Heat;Ultrasound      Moist Heat Therapy   Number Minutes Moist Heat  15 Minutes    Moist Heat Location  Lumbar Spine      Electrical Stimulation   Electrical Stimulation Location  R SI joint, proximal HS    Electrical Stimulation Action  Pre-Mod    Electrical Stimulation Parameters  80-150 hz x15 min    Electrical Stimulation Goals  Pain      Ultrasound   Ultrasound Location  R SI/ low back    Ultrasound Parameters  1.5 w/cm2, 100% 1 mhz x10 min    Ultrasound Goals  Pain      Manual Therapy   Manual Therapy  Soft tissue mobilization    Soft tissue mobilization  STW to R glute, SI joint, proximal HS origin to reduce muscle tightness and pain  PT Long Term Goals - 08/27/17 1213      PT LONG TERM GOAL #1   Title  Ind with HEP.    Status  On-going      PT LONG TERM GOAL #2   Title  Patient able to perform ADLs with pain not greater than 2/10 in RLE    Status  On-going      PT LONG TERM GOAL #3   Title  Eliminate right LE symptoms.    Status  On-going      PT LONG TERM GOAL #4   Title  Perform ADL's with pain not > 3/10.    Status  On-going            Plan - 09/10/17 1645    Clinical Impression Statement  Patient presented in clinic today with reports of increased pain in R HS region down to R foot. Patient has noticed an increase in pain since the past several treatments. Conservative treatment completed today due to the exaggerated LBP. Patient experienced increased tenderness to manual therapy along the superior R glute and reports of tenderness to R HS origin. No tenderness or pain upon palpation of the R SI jont or low back. Patient elected to complete electrical stimulaton along with moist heat instead of mechanical traction. Patient educated that if pain continued to contact MD.    Rehab Potential   Good    PT Frequency  2x / week    PT Duration  6 weeks    PT Treatment/Interventions  ADLs/Self Care Home Management;Cryotherapy;Electrical Stimulation;Moist Heat;Traction;Ultrasound;Therapeutic exercise;Neuromuscular re-education;Patient/family education;Manual techniques;Dry needling    PT Next Visit Plan  Assess symptoms in regards to PT treatment.    Consulted and Agree with Plan of Care  Patient       Patient will benefit from skilled therapeutic intervention in order to improve the following deficits and impairments:  Decreased range of motion, Postural dysfunction, Impaired flexibility, Pain  Visit Diagnosis: Right buttock pain  Right thigh pain     Problem List Patient Active Problem List   Diagnosis Date Noted  . Essential hypertension 07/23/2016  . PSVT (paroxysmal supraventricular tachycardia) (HCC) 07/23/2016  . Bradycardia 07/23/2016    Marvell Fuller, PTA 09/10/2017, 4:50 PM  [facility] [address] [place], [place], [address] Phone: [phone]   Fax:  [phone]  Name: Stephen Hull MRN: [id number] Date of Birth: [birth year]

## 2017-09-15 ENCOUNTER — Ambulatory Visit: Payer: Medicare Other | Admitting: Physical Therapy

## 2017-09-15 DIAGNOSIS — M79651 Pain in right thigh: Secondary | ICD-10-CM

## 2017-09-15 DIAGNOSIS — M7918 Myalgia, other site: Secondary | ICD-10-CM | POA: Diagnosis not present

## 2017-09-15 NOTE — Therapy (Signed)
[facility] [address] [place], [place], [address] Phone: [phone]   Fax:  [phone]  Physical Therapy Treatment  Patient Details  Name: DANEL REQUENA MRN: [id number] Date of Birth: 06-09-1946 Referring Provider: Rueben Bash   Encounter Date: 09/15/2017  PT End of Session - 09/15/17 1618    Visit Number  8    Number of Visits  12    Date for PT Re-Evaluation  09/29/17    PT Start Time  1517    PT Stop Time  1607    PT Time Calculation (min)  50 min    Activity Tolerance  Patient tolerated treatment well    Behavior During Therapy  [facility] for tasks assessed/performed       Past Medical History:  Diagnosis Date  . Hypertension     Past Surgical History:  Procedure Laterality Date  . COLONOSCOPY    . COLONOSCOPY N/A 11/14/2016   Procedure: COLONOSCOPY;  Surgeon: Corbin Ade, MD;  [facility];  Service: Endoscopy;  Laterality: N/A;  730   . TONSILLECTOMY     as a kid    There were no vitals filed for this visit.  Subjective Assessment - 09/15/17 1605    Subjective  Patient reported R proximal HS pain is 6/10. It is still very tender but he doesn't feel the L foot pain and tingling is as bad. Patient states he no longer wants to do the traction machine.    Pertinent History  lumbar stenosis    How long can you stand comfortably?   10 min    Diagnostic tests  MRI 2017 showed the stenosis (central)    Currently in Pain?  Yes    Pain Score  6     Pain Location  Leg    Pain Orientation  Right;Posterior    Pain Descriptors / Indicators  Aching    Pain Type  Chronic pain    Pain Onset  1 to 4 weeks ago         [facility] PT Assessment - 09/15/17 0001      Assessment   Medical Diagnosis  R buttock pain and R thigh pain    Onset Date/Surgical Date  07/21/17    Next MD Visit  none                  [facility] Adult PT Treatment/Exercise - 09/15/17 0001      Modalities   Modalities   Electrical Stimulation      Moist Heat Therapy   Number Minutes Moist Heat  15 Minutes    Moist Heat Location  Lumbar Spine      Electrical Stimulation   Electrical Stimulation Location  Right glute, proximal hamstring    Electrical Stimulation Action  Pre-mod    Electrical Stimulation Parameters  80-150 Hz x15    Electrical Stimulation Goals  Pain      Ultrasound   Ultrasound Location  R low back    Ultrasound Parameters  1.5 w/cm2 x12    Ultrasound Goals  Pain      Manual Therapy   Manual Therapy  Soft tissue mobilization    Soft tissue mobilization  STW to R glute, proximal HS origin to reduce muscle tightness and pain                  PT Long Term Goals - 08/27/17 1213      PT LONG TERM GOAL #1  Title  Ind with HEP.    Status  On-going      PT LONG TERM GOAL #2   Title  Patient able to perform ADLs with pain not greater than 2/10 in RLE    Status  On-going      PT LONG TERM GOAL #3   Title  Eliminate right LE symptoms.    Status  On-going      PT LONG TERM GOAL #4   Title  Perform ADL's with pain not > 3/10.    Status  On-going            Plan - 09/15/17 1557    Clinical Impression Statement  Patient able to tolerate conservative treatment for pain relief. Patient continues to have R glute tenderness at HS origin. Patient stated after STW/M he felt "no pain." Patient educated on importance of reducing pain levels and introducing exercises to help strengthen the muscle. Patient reported understanding. No adverse effects noted upon removal of modalities.    Clinical Presentation  Stable    Clinical Decision Making  Low    Rehab Potential  Good    PT Frequency  2x / week    PT Duration  6 weeks    PT Treatment/Interventions  ADLs/Self Care Home Management;Cryotherapy;Electrical Stimulation;Moist Heat;Traction;Ultrasound;Therapeutic exercise;Neuromuscular re-education;Patient/family education;Manual techniques;Dry needling    PT Next Visit Plan   Continue conservative treatment to decrease pain, introduce low level strengthening exercises as tolerated.    Consulted and Agree with Plan of Care  Patient       Patient will benefit from skilled therapeutic intervention in order to improve the following deficits and impairments:  Decreased range of motion, Postural dysfunction, Impaired flexibility, Pain  Visit Diagnosis: Right buttock pain  Right thigh pain     Problem List Patient Active Problem List   Diagnosis Date Noted  . Essential hypertension 07/23/2016  . PSVT (paroxysmal supraventricular tachycardia) (HCC) 07/23/2016  . Bradycardia 07/23/2016   Guss BundeKrystle Aeson Sawyers, PT, DPT 09/15/2017, 4:35 PM  [facility] [address] [place], [place], [address] Phone: [phone]   Fax:  [phone]  Name: Marcelyn BruinsRonald G Arney MRN: [id number] Date of Birth: Nov 03, 1945

## 2017-09-17 ENCOUNTER — Ambulatory Visit: Payer: Medicare Other | Admitting: Physical Therapy

## 2017-09-17 ENCOUNTER — Encounter: Payer: Self-pay | Admitting: Physical Therapy

## 2017-09-17 DIAGNOSIS — M7918 Myalgia, other site: Secondary | ICD-10-CM | POA: Diagnosis not present

## 2017-09-17 DIAGNOSIS — M79651 Pain in right thigh: Secondary | ICD-10-CM

## 2017-09-17 NOTE — Therapy (Signed)
[facility] [address] [place], [place], [address] Phone: [phone]   Fax:  [phone]  Physical Therapy Treatment  Patient Details  Name: Stephen Hull MRN: [id number] Date of Birth: 02-03-1946 Referring Provider: Rueben Bash   Encounter Date: 09/17/2017  PT End of Session - 09/17/17 1519    Visit Number  9    Number of Visits  12    Date for PT Re-Evaluation  09/29/17    PT Start Time  1519    PT Stop Time  1604    PT Time Calculation (min)  45 min    Activity Tolerance  Patient tolerated treatment well    Behavior During Therapy  [facility] for tasks assessed/performed       Past Medical History:  Diagnosis Date  . Hypertension     Past Surgical History:  Procedure Laterality Date  . COLONOSCOPY    . COLONOSCOPY N/A 11/14/2016   Procedure: COLONOSCOPY;  Surgeon: Corbin Ade, MD;  [facility];  Service: Endoscopy;  Laterality: N/A;  730   . TONSILLECTOMY     as a kid    There were no vitals filed for this visit.  Subjective Assessment - 09/17/17 1518    Subjective  Reports less R hip pain and reports that he still has some discomfort down RLE and still has some numbness in R foot but no longer tingling.    Pertinent History  lumbar stenosis    How long can you stand comfortably?   10 min    Diagnostic tests  MRI 2017 showed the stenosis (central)    Currently in Pain?  Yes    Pain Score  2     Pain Location  Back    Pain Orientation  Right    Pain Descriptors / Indicators  Discomfort    Pain Type  Chronic pain    Pain Onset  1 to 4 weeks ago         [facility] PT Assessment - 09/17/17 0001      Assessment   Medical Diagnosis  R buttock pain and R thigh pain    Onset Date/Surgical Date  07/21/17    Next MD Visit  none      Precautions   Precautions  None      Restrictions   Weight Bearing Restrictions  No                  [facility] Adult PT Treatment/Exercise - 09/17/17 0001       Modalities   Modalities  Electrical Stimulation;Moist Heat;Ultrasound      Moist Heat Therapy   Number Minutes Moist Heat  15 Minutes    Moist Heat Location  Lumbar Spine      Electrical Stimulation   Electrical Stimulation Location  R low back, SI    Electrical Stimulation Action  Pre-Mod    Electrical Stimulation Parameters  80-150 hz x15 min    Electrical Stimulation Goals  Pain      Ultrasound   Ultrasound Location  R SI joint/ low back    Ultrasound Parameters  Combo 1.5 w/cm2 x10 min    Ultrasound Goals  Pain      Manual Therapy   Manual Therapy  Soft tissue mobilization    Soft tissue mobilization  STW to R SI joint, glute to reduce muscle tightness and pain in L SL  PT Long Term Goals - 08/27/17 1213      PT LONG TERM GOAL #1   Title  Ind with HEP.    Status  On-going      PT LONG TERM GOAL #2   Title  Patient able to perform ADLs with pain not greater than 2/10 in RLE    Status  On-going      PT LONG TERM GOAL #3   Title  Eliminate right LE symptoms.    Status  On-going      PT LONG TERM GOAL #4   Title  Perform ADL's with pain not > 3/10.    Status  On-going            Plan - 09/17/17 1626    Clinical Impression Statement  Patient tolerated today's treatment well as he reports he still has pain symptoms down RLE and R foot but not to previously stated intensity. Muscle tightness palpated in the R glute with manual therapy completed to R SI ligament as well. No complaints of any increased pain reported by patient during treatment. Normal modalities response noted following removal of the modalities.    Rehab Potential  Good    PT Frequency  2x / week    PT Duration  6 weeks    PT Treatment/Interventions  ADLs/Self Care Home Management;Cryotherapy;Electrical Stimulation;Moist Heat;Traction;Ultrasound;Therapeutic exercise;Neuromuscular re-education;Patient/family education;Manual techniques;Dry needling    PT Next Visit  Plan  Continue conservative treatment to decrease pain, introduce low level strengthening exercises as tolerated.    Consulted and Agree with Plan of Care  Patient       Patient will benefit from skilled therapeutic intervention in order to improve the following deficits and impairments:  Decreased range of motion, Postural dysfunction, Impaired flexibility, Pain  Visit Diagnosis: Right buttock pain  Right thigh pain     Problem List Patient Active Problem List   Diagnosis Date Noted  . Essential hypertension 07/23/2016  . PSVT (paroxysmal supraventricular tachycardia) (HCC) 07/23/2016  . Bradycardia 07/23/2016    Marvell FullerKelsey P Kennon, PTA 09/17/2017, 4:28 PM  [facility] [address] [place], [place], [address] Phone: [phone]   Fax:  [phone]  Name: Stephen Hull MRN: [id number] Date of Birth: [birth year]

## 2017-09-22 ENCOUNTER — Ambulatory Visit: Payer: Medicare Other | Attending: Physician Assistant | Admitting: Physical Therapy

## 2017-09-22 DIAGNOSIS — M7918 Myalgia, other site: Secondary | ICD-10-CM

## 2017-09-22 DIAGNOSIS — M79651 Pain in right thigh: Secondary | ICD-10-CM | POA: Diagnosis present

## 2017-09-22 NOTE — Therapy (Signed)
[facility] [address] [place], [place], [address] Phone: [phone]   Fax:  [phone]  Physical Therapy Treatment  Patient Details  Name: Stephen Hull MRN: [id number] Date of Birth: 1945-08-07 Referring Provider: Rueben Bash   Encounter Date: 09/22/2017  PT End of Session - 09/22/17 1516    Visit Number  10    Number of Visits  12    Date for PT Re-Evaluation  09/29/17    PT Start Time  1518    PT Stop Time  1610    PT Time Calculation (min)  52 min    Activity Tolerance  Patient tolerated treatment well    Behavior During Therapy  [employer] for tasks assessed/performed       Past Medical History:  Diagnosis Date  . Hypertension     Past Surgical History:  Procedure Laterality Date  . COLONOSCOPY    . COLONOSCOPY N/A 11/14/2016   Procedure: COLONOSCOPY;  Surgeon: Corbin Ade, MD;  [facility];  Service: Endoscopy;  Laterality: N/A;  730   . TONSILLECTOMY     as a kid    There were no vitals filed for this visit.  Subjective Assessment - 09/22/17 1740    Subjective  Patient reports pain today is at 6-8/10.    Pertinent History  lumbar stenosis    How long can you stand comfortably?   10 min    Diagnostic tests  MRI 2017 showed the stenosis (central)    Currently in Pain?  Yes    Pain Score  8     Pain Location  Buttocks    Pain Orientation  Right    Pain Descriptors / Indicators  Sore    Pain Type  Chronic pain    Pain Onset  More than a month ago         [facility] PT Assessment - 09/22/17 0001      Assessment   Medical Diagnosis  R buttock pain and R thigh pain    Onset Date/Surgical Date  07/21/17    Next MD Visit  none      Precautions   Precautions  None                  [facility] Adult PT Treatment/Exercise - 09/22/17 0001      Exercises   Exercises  Knee/Hip;Lumbar      Knee/Hip Exercises: Prone   Hamstring Curl  3 sets;10 reps;2 seconds      Modalities   Modalities  Electrical Stimulation;Moist Heat;Ultrasound      Moist Heat Therapy   Number Minutes Moist Heat  10 Minutes    Moist Heat Location  Lumbar Spine      Electrical Stimulation   Electrical Stimulation Location  R glute & R low back    Electrical Stimulation Action  Pre-mod    Electrical Stimulation Parameters  80-150 hz x10    Electrical Stimulation Goals  Pain      Ultrasound   Ultrasound Location  Right glute and low back    Ultrasound Parameters  1.5 w/cm2 100% 1 mhz, x10    Ultrasound Goals  Pain      Manual Therapy   Manual Therapy  Soft tissue mobilization    Soft tissue mobilization  STW to R SI joint, glute to reduce muscle tightness and pain in L SL                  PT  Long Term Goals - 08/27/17 1213      PT LONG TERM GOAL #1   Title  Ind with HEP.    Status  On-going      PT LONG TERM GOAL #2   Title  Patient able to perform ADLs with pain not greater than 2/10 in RLE    Status  On-going      PT LONG TERM GOAL #3   Title  Eliminate right LE symptoms.    Status  On-going      PT LONG TERM GOAL #4   Title  Perform ADL's with pain not > 3/10.    Status  On-going            Plan - 09/22/17 1722    Clinical Impression Statement  Patient tolerated treatment well today. Patient and PT discussed how he feels PT has made symptoms in hamstring R LE worse. Pt discussed adding strengthening exercises for back and glutes/hamstrings to POC to address pain. Patient stated his back is not his problem. Pt educated on nerve anatomy and stated his neurological sx still comes and goes. Normal response to modalities upon removal.     Clinical Presentation  Stable    Clinical Decision Making  Low    Rehab Potential  Good    PT Frequency  2x / week    PT Duration  6 weeks    PT Treatment/Interventions  ADLs/Self Care Home Management;Cryotherapy;Electrical Stimulation;Moist Heat;Traction;Ultrasound;Therapeutic exercise;Neuromuscular  re-education;Patient/family education;Manual techniques;Dry needling    PT Next Visit Plan  Continue conservative treatment to decrease pain, introduce low level strengthening exercises as tolerated.    Consulted and Agree with Plan of Care  Patient       Patient will benefit from skilled therapeutic intervention in order to improve the following deficits and impairments:  Decreased range of motion, Postural dysfunction, Impaired flexibility, Pain  Visit Diagnosis: Right buttock pain  Right thigh pain     Problem List Patient Active Problem List   Diagnosis Date Noted  . Essential hypertension 07/23/2016  . PSVT (paroxysmal supraventricular tachycardia) (HCC) 07/23/2016  . Bradycardia 07/23/2016   Guss BundeKrystle Sequan Auxier, PT, DPT 09/22/2017, 5:42 PM  [facility] [address] [place], [place], [address] Phone: [phone]   Fax:  [phone]  Name: Stephen Hull MRN: [id number] Date of Birth: 02-12-1946

## 2017-09-24 ENCOUNTER — Encounter: Payer: Medicare Other | Admitting: Physical Therapy

## 2018-01-25 DIAGNOSIS — M5431 Sciatica, right side: Secondary | ICD-10-CM | POA: Insufficient documentation

## 2019-07-26 ENCOUNTER — Other Ambulatory Visit: Payer: Self-pay

## 2019-07-26 ENCOUNTER — Ambulatory Visit (INDEPENDENT_AMBULATORY_CARE_PROVIDER_SITE_OTHER): Payer: Medicare Other | Admitting: Cardiovascular Disease

## 2019-07-26 ENCOUNTER — Encounter: Payer: Self-pay | Admitting: Cardiovascular Disease

## 2019-07-26 VITALS — BP 134/88 | HR 72 | Temp 97.2°F | Ht 70.0 in | Wt 252.0 lb

## 2019-07-26 DIAGNOSIS — I10 Essential (primary) hypertension: Secondary | ICD-10-CM

## 2019-07-26 DIAGNOSIS — I471 Supraventricular tachycardia: Secondary | ICD-10-CM | POA: Diagnosis not present

## 2019-07-26 DIAGNOSIS — R001 Bradycardia, unspecified: Secondary | ICD-10-CM | POA: Diagnosis not present

## 2019-07-26 NOTE — Assessment & Plan Note (Signed)
I first saw him in 1991 for PSVT.  He has had no recurrence since.

## 2019-07-26 NOTE — Assessment & Plan Note (Signed)
History of essential hypertension blood pressure measured today 134/88.  He is on Sectral and losartan.

## 2019-07-26 NOTE — Progress Notes (Signed)
07/26/2019 QUANTAY ZAREMBA   23-Dec-1945  [id number]  Primary Physician Aletha Halim., PA-C Primary Cardiologist: Lorretta Harp MD Lupe Carney, [place]  HPI:  Stephen Hull is a 74 y.o.  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 He was referred by Leonia Reader PA-C for a symptomatically bradycardia. I originally saw him in 1991 for PSVT. At that time I performed cardiac catheterization which was apparently normal and placed him on Sectral, beta blocker. I had not seen him back since until 07/23/2016.  He does have a history of hypertension. He quit smoking in 1991 when I suggested that. He's lost 35 pounds in the last 6 months because of back problems. He denies chest pain or shortness of breath. He was noted to have a symptomatic bradycardia with a heart rate of 36 during a recent office visit with his PCP.  Since I saw him 3 years ago he has remained stable.  He is active.  Denies chest pain or shortness of breath.  He said no further episodes of dizziness, bradycardia or PSVT.   Current Meds  Medication Sig  . acebutolol (SECTRAL) 200 MG capsule Take 1 capsule by mouth 2 (two) times daily.  . Cholecalciferol (VITAMIN D3) 2000 units TABS Take 2,000 Units by mouth daily.  [name] dextromethorphan-guaiFENesin (MUCINEX DM) 30-600 MG 12hr tablet Take 1 tablet by mouth daily as needed for cough.  [name] ibuprofen (ADVIL,MOTRIN) 200 MG tablet Take 400 mg by mouth every 6 (six) hours as needed for mild pain.  [name] losartan (COZAAR) 50 MG tablet Take 1 tablet by mouth daily.  [name] omeprazole (PRILOSEC) 40 MG capsule Take 1 capsule by mouth daily.  . polyethylene glycol-electrolytes (TRILYTE) 420 g solution Take 4,000 mLs by mouth as directed.     No Known Allergies  Social History   Socioeconomic History  . Marital status: Married    Spouse name: Not on file  . Number of children: Not on  file  . Years of education: Not on file  . Highest education level: Not on file  Occupational History  . Not on file  Tobacco Use  . Smoking status: Former Smoker    Types: Cigarettes    Quit date: 07/21/1989    Years since quitting: 30.0  . Smokeless tobacco: Former Systems developer    Quit date: 07/21/1989  Substance and Sexual Activity  . Alcohol use: No  . Drug use: No  . Sexual activity: Not on file  Other Topics Concern  . Not on file  Social History Narrative  . Not on file   Social Determinants of Health   Financial Resource Strain:   . Difficulty of Paying Living Expenses: Not on file  Food Insecurity:   . Worried About Charity fundraiser in the Last Year: Not on file  . Ran Out of Food in the Last Year: Not on file  Transportation Needs:   . Lack of Transportation (Medical): Not on file  . Lack of Transportation (Non-Medical): Not on file  Physical Activity:   . Days of Exercise per Week: Not on file  . Minutes of Exercise per Session: Not on file  Stress:   . Feeling of Stress : Not on file  Social Connections:   . Frequency of Communication with Friends and Family: Not on file  . Frequency of Social Gatherings with Friends and Family: Not on file  .  Attends Religious Services: Not on file  . Active Member of Clubs or Organizations: Not on file  . Attends Banker Meetings: Not on file  . Marital Status: Not on file  Intimate Partner Violence:   . Fear of Current or Ex-Partner: Not on file  . Emotionally Abused: Not on file  . Physically Abused: Not on file  . Sexually Abused: Not on file     Review of Systems: General: negative for chills, fever, night sweats or weight changes.  Cardiovascular: negative for chest pain, dyspnea on exertion, edema, orthopnea, palpitations, paroxysmal nocturnal dyspnea or shortness of breath Dermatological: negative for rash Respiratory: negative for cough or wheezing Urologic: negative for hematuria Abdominal: negative  for nausea, vomiting, diarrhea, bright red blood per rectum, melena, or hematemesis Neurologic: negative for visual changes, syncope, or dizziness All other systems reviewed and are otherwise negative except as noted above.    Blood pressure 134/88, pulse 72, temperature (!) 97.2 F (36.2 C), height 5\' 10"  (1.778 m), weight 252 lb (114.3 kg).  General appearance: alert and no distress Neck: no adenopathy, no carotid bruit, no JVD, supple, symmetrical, trachea midline and thyroid not enlarged, symmetric, no tenderness/mass/nodules Lungs: clear to auscultation bilaterally Heart: regular rate and rhythm, S1, S2 normal, no murmur, click, rub or gallop Extremities: extremities normal, atraumatic, no cyanosis or edema Pulses: 2+ and symmetric Skin: Skin color, texture, turgor normal. No rashes or lesions Neurologic: Alert and oriented X 3, normal strength and tone. Normal symmetric reflexes. Normal coordination and gait  EKG sinus rhythm at 72 with occasional PVC.  I personally reviewed this EKG.  ASSESSMENT AND PLAN:   Essential hypertension History of essential hypertension blood pressure measured today 134/88.  He is on Sectral and losartan.  PSVT (paroxysmal supraventricular tachycardia) (HCC) I first saw him in 1991 for PSVT.  He has had no recurrence since.  Bradycardia History of bradycardia when I saw him 3 years ago with heart rates in the 30s but this is no longer an issue.      MD FACP,FACC,FAHA, [facility] 07/26/2019 9:12 AM

## 2019-07-26 NOTE — Assessment & Plan Note (Signed)
History of bradycardia when I saw him 3 years ago with heart rates in the 30s but this is no longer an issue.

## 2019-07-26 NOTE — Patient Instructions (Signed)
Medication Instructions:  Your physician recommends that you continue on your current medications as directed. Please refer to the Current Medication list given to you today.  If you need a refill on your cardiac medications before your next appointment, please call your pharmacy.   Lab work: NONE  Testing/Procedures: NONE  Follow-Up: At [facility], you and your health needs are our priority.  As part of our continuing mission to provide you with exceptional heart care, we have created designated Provider Care Teams.  These Care Teams include your primary Cardiologist (physician) and Advanced Practice Providers (APPs -  Physician Assistants and Nurse Practitioners) who all work together to provide you with the care you need, when you need it. You may see Dr Berry or one of the following Advanced Practice Providers on your designated Care Team:    Luke Kilroy, PA-C  Callie Goodrich, PA-C  Jesse Cleaver, FNP  Your physician wants you to follow-up in: as needed      

## 2019-08-29 ENCOUNTER — Ambulatory Visit: Payer: Medicare Other | Attending: Internal Medicine

## 2019-08-29 DIAGNOSIS — Z23 Encounter for immunization: Secondary | ICD-10-CM | POA: Insufficient documentation

## 2019-08-29 NOTE — Progress Notes (Signed)
   Covid-19 Vaccination Clinic  Name:  Stephen Hull    MRN: [id number] DOB: 02/28/1946  08/29/2019  Stephen Hull was observed post Covid-19 immunization for 15 minutes without incidence. He was provided with Vaccine Information Sheet and instruction to access the V-Safe system.   Stephen Hull was instructed to call 911 with any severe reactions post vaccine: [name] Difficulty breathing  . Swelling of your face and throat  . A fast heartbeat  . A bad rash all over your body  . Dizziness and weakness    Immunizations Administered    Name Date Dose VIS Date Route   Pfizer COVID-19 Vaccine 08/29/2019  4:10 PM 0.3 mL 07/01/2019 Intramuscular   Manufacturer: [employer], [employer]   Lot: [id number]   NDC: [address]-8

## 2019-09-26 ENCOUNTER — Ambulatory Visit: Payer: Medicare Other | Attending: Internal Medicine

## 2019-09-26 DIAGNOSIS — Z23 Encounter for immunization: Secondary | ICD-10-CM | POA: Insufficient documentation

## 2019-09-26 NOTE — Progress Notes (Signed)
   Covid-19 Vaccination Clinic  Name:  Stephen Hull    MRN: [id number] DOB: [birth year]  09/26/2019  Mr. Code was observed post Covid-19 immunization for 15 minutes without incident. He was provided with Vaccine Information Sheet and instruction to access the V-Safe system.   [name] was instructed to call 911 with any severe reactions post vaccine: [name] Difficulty breathing  . Swelling of face and throat  . A fast heartbeat  . A bad rash all over body  . Dizziness and weakness   Immunizations Administered    Name Date Dose VIS Date Route   Pfizer COVID-19 Vaccine 09/26/2019  8:22 AM 0.3 mL 07/01/2019 Intramuscular   Manufacturer: [employer], [employer]   Lot: [id number]   NDC: [address]-6

## 2021-01-28 ENCOUNTER — Encounter: Payer: Self-pay | Admitting: Physical Therapy

## 2021-01-28 ENCOUNTER — Other Ambulatory Visit: Payer: Self-pay

## 2021-01-28 ENCOUNTER — Ambulatory Visit: Payer: Medicare HMO | Attending: Family | Admitting: Physical Therapy

## 2021-01-28 DIAGNOSIS — M25512 Pain in left shoulder: Secondary | ICD-10-CM | POA: Diagnosis present

## 2021-01-28 DIAGNOSIS — M6281 Muscle weakness (generalized): Secondary | ICD-10-CM | POA: Diagnosis present

## 2021-01-28 DIAGNOSIS — M25511 Pain in right shoulder: Secondary | ICD-10-CM | POA: Insufficient documentation

## 2021-01-28 NOTE — Therapy (Signed)
[facility] [address] [place], [place], [address] Phone: [phone]   Fax:  [phone]  Physical Therapy Evaluation  Patient Details  Name: Stephen Hull MRN: [id number] Date of Birth: [birth year] Referring Provider (PT): Richardson Chiquito   Encounter Date: 01/28/2021   PT End of Session - 01/28/21 0915     Visit Number 1    Number of Visits 12    Date for PT Re-Evaluation 03/11/21    Authorization Type PROGRESS NOTE AT 10TH VISIT.  KX MODIFIER AFTER 15 VISITS.    PT Start Time [id number]    PT Stop Time [id number]    PT Time Calculation (min) 36 min    Activity Tolerance Patient tolerated treatment well    Behavior During Therapy [facility] for tasks assessed/performed             Past Medical History:  Diagnosis Date   Hypertension     Past Surgical History:  Procedure Laterality Date   COLONOSCOPY     COLONOSCOPY N/A 11/14/2016   Procedure: COLONOSCOPY;  Surgeon: Corbin Ade, MD;  [facility];  Service: Endoscopy;  Laterality: N/A;  730    TONSILLECTOMY     as a kid    There were no vitals filed for this visit.    Subjective Assessment - 01/28/21 0855     Subjective COVID-19 screen performed prior to patient entering clinic.  The patient presents to the clinic today with c/o bilateral shoulder injuries related to falls over the last few months.  One fall was the result of slipping off a trailer and falling into a wall on his right shoulder.  He states recent injections have been very helpful and he has no pain at this time.  Increase UE activity can increase his pain.    Pertinent History HTN.    Diagnostic tests MRI, X-ray    Patient Stated Goals Use bilateral UE's without pain.    Currently in Pain? No/denies                [facility] PT Assessment - 01/28/21 0001       Assessment   Medical Diagnosis Bilateral shouder pain    Referring Provider (PT) Babs Sciara Scarlette Beek    Onset Date/Surgical  Date --   Several months.     Precautions   Precautions None      Restrictions   Weight Bearing Restrictions No      Balance Screen   Has the patient fallen in the past 6 months Yes    How many times? 1.    Has the patient had a decrease in activity level because of a fear of falling?  No    Is the patient reluctant to leave their home because of a fear of falling?  No      Home Environment   Living Environment Private residence      Prior Function   Level of Independence Independent      Posture/Postural Control   Posture/Postural Control Postural limitations    Postural Limitations Rounded Shoulders;Forward head      ROM / Strength   AROM / PROM / Strength AROM;Strength      AROM   Overall AROM Comments Full bilateral AROM of shoulders.      Strength   Overall Strength Comments Right shoulder abudction and ER is 4-/5.  Left shoulder is grossly graded at 4+/5 throughout al the major muscle groups.  Palpation   Palpation comment Soem tenderness to palption over right posterior cuff musculature and acromial ridge.  No left shoulder palpable pain.      Special Tests   Other special tests Minimal and most pain reproduction with Hawkins-Neer testing.      Ambulation/Gait   Gait Comments WNL.                        Objective measurements completed on examination: See above findings.       [facility] Adult PT Treatment/Exercise - 01/28/21 0001       Modalities   Modalities Electrical Stimulation      Electrical Stimulation   Electrical Stimulation Location Right shoulder.    Electrical Stimulation Action Pre-mod.    Electrical Stimulation Parameters 80-150 Hz. x 15 minutes.                         PT Long Term Goals - 01/28/21 0943       PT LONG TERM GOAL #1   Title Ind with HEP.    Time 6    Period Weeks    Status New      PT LONG TERM GOAL #2   Title Patient able to perform ADLs with pain not greater than 2/10 in  bilateral UE's.    Time 6    Period Weeks    Status New                    Plan - 01/28/21 0939     Clinical Impression Statement The patient presents to OPPT with /o occasional bilateral shoulder pain right > left.  This is the result of falls.  He exhibits full active bilateral shoulder range of motion.  He exhibits some wekaness into right shoulder ER and abduction.  He has some tenderness to palpation over his right posterior cuff musculature and acromial ridge.  He did very well with recent injections and had no pain upon presentation to the clinic today.    Personal Factors and Comorbidities Comorbidity 1;Other    Comorbidities HTN.    Examination-Participation Restrictions Other    Stability/Clinical Decision Making Stable/Uncomplicated    Clinical Decision Making Low    Rehab Potential Excellent    PT Frequency 2x / week    PT Duration 6 weeks    PT Treatment/Interventions ADLs/Self Care Home Management;Electrical Stimulation;Cryotherapy;Ultrasound;Therapeutic exercise;Therapeutic activities;Patient/family education;Manual techniques;Vasopneumatic Device    PT Next Visit Plan Bilateral shoulder strengthening and modalties and STW/M as needed.    Consulted and Agree with Plan of Care Patient             Patient will benefit from skilled therapeutic intervention in order to improve the following deficits and impairments:  Pain, Decreased activity tolerance, Decreased strength  Visit Diagnosis: Acute pain of right shoulder - Plan: PT plan of care cert/re-cert  Acute pain of left shoulder - Plan: PT plan of care cert/re-cert  Muscle weakness (generalized) - Plan: PT plan of care cert/re-cert     Problem List Patient Active Problem List   Diagnosis Date Noted   Essential hypertension 07/23/2016   PSVT (paroxysmal supraventricular tachycardia) (HCC) 07/23/2016   Bradycardia 07/23/2016    Maylani Embree, [place] MPT 01/28/2021, 9:45 AM  [facility] [address] [place], [place], [address] Phone: [phone]   Fax:  [phone]  Name: Stephen Hull MRN: [id number] Date of Birth: October 21, 1945

## 2021-01-31 ENCOUNTER — Encounter: Payer: Self-pay | Admitting: *Deleted

## 2021-01-31 ENCOUNTER — Other Ambulatory Visit: Payer: Self-pay

## 2021-01-31 ENCOUNTER — Ambulatory Visit: Payer: Medicare HMO | Admitting: *Deleted

## 2021-01-31 DIAGNOSIS — M25512 Pain in left shoulder: Secondary | ICD-10-CM

## 2021-01-31 DIAGNOSIS — M25511 Pain in right shoulder: Secondary | ICD-10-CM | POA: Diagnosis not present

## 2021-01-31 DIAGNOSIS — M6281 Muscle weakness (generalized): Secondary | ICD-10-CM

## 2021-01-31 NOTE — Therapy (Signed)
[facility] [address] [place], [place], [address] Phone: [phone]   Fax:  [phone]  Physical Therapy Treatment  Patient Details  Name: Stephen Hull MRN: [id number] Date of Birth: 02/29/1946 Referring Provider (PT): Richardson Chiquito   Encounter Date: 01/31/2021   PT End of Session - 01/31/21 0912     Visit Number 2    Number of Visits 12    Date for PT Re-Evaluation 03/11/21    Authorization Type PROGRESS NOTE AT 10TH VISIT.  KX MODIFIER AFTER 15 VISITS.    PT Start Time 0815    PT Stop Time 0902    PT Time Calculation (min) 47 min             Past Medical History:  Diagnosis Date   Hypertension     Past Surgical History:  Procedure Laterality Date   COLONOSCOPY     COLONOSCOPY N/A 11/14/2016   Procedure: COLONOSCOPY;  Surgeon: Corbin Ade, MD;  [facility];  Service: Endoscopy;  Laterality: N/A;  730    TONSILLECTOMY     as a kid    There were no vitals filed for this visit.   Subjective Assessment - 01/31/21 0810     Subjective COVID-19 screen performed prior to patient entering clinic.  Mainly soreness RT shldr    Pertinent History HTN.   small RTC tear RT shldr    Diagnostic tests MRI, X-ray    Patient Stated Goals Use bilateral UE's without pain.                               [facility] Adult PT Treatment/Exercise - 01/31/21 0001       Exercises   Exercises Shoulder      Shoulder Exercises: Standing   Protraction Strengthening;Both;20 reps;Theraband    Theraband Level (Shoulder Protraction) Level 1 (Yellow)    External Rotation Strengthening;Both;20 reps;Theraband    Theraband Level (Shoulder External Rotation) Level 1 (Yellow)    Internal Rotation Strengthening;Both;20 reps;Theraband    Theraband Level (Shoulder Internal Rotation) Level 1 (Yellow)    Extension Strengthening;Both;20 reps;Theraband    Theraband Level (Shoulder Extension) Level 1 (Yellow)     Row Strengthening;Both;20 reps;Theraband    Theraband Level (Shoulder Row) Level 1 (Yellow)      Shoulder Exercises: Pulleys   Flexion 5 minutes    Other Pulley Exercises Standing UE ranger CW/CCW 3x10 Both shldrs      Shoulder Exercises: ROM/Strengthening   UBE (Upper Arm Bike) 120 RPMs x 6 mins F/R      Manual Therapy   Manual Therapy Soft tissue mobilization    Soft tissue mobilization STW/ IASTM to Bil shldrs poteriolateral aspects with Pt sitting                         PT Long Term Goals - 01/28/21 0943       PT LONG TERM GOAL #1   Title Ind with HEP.    Time 6    Period Weeks    Status New      PT LONG TERM GOAL #2   Title Patient able to perform ADLs with pain not greater than 2/10 in bilateral UE's.    Time 6    Period Weeks    Status New  Plan - 01/31/21 0811     Clinical Impression Statement Pt arrived today doing fairly well with mainly soreness both shldrs RT>LT. Rx focused on light  Tband strengthening close to the body with yellow band, and AAROM exs away from the body and did well. STW/IASTM to Bil shldrs posteriolateral aspects with Pt sitting. Handout and yellow band given to Pt.    Personal Factors and Comorbidities Comorbidity 1;Other    Comorbidities HTN.    Examination-Participation Restrictions Other    Stability/Clinical Decision Making Stable/Uncomplicated    Rehab Potential Excellent    PT Frequency 2x / week    PT Duration 6 weeks    PT Treatment/Interventions ADLs/Self Care Home Management;Electrical Stimulation;Cryotherapy;Ultrasound;Therapeutic exercise;Therapeutic activities;Patient/family education;Manual techniques;Vasopneumatic Device    PT Next Visit Plan Bilateral shoulder strengthening and modalties and STW/M as needed.    Consulted and Agree with Plan of Care Patient             Patient will benefit from skilled therapeutic intervention in order to improve the following deficits and  impairments:  Pain, Decreased activity tolerance, Decreased strength  Visit Diagnosis: Acute pain of right shoulder  Acute pain of left shoulder  Muscle weakness (generalized)     Problem List Patient Active Problem List   Diagnosis Date Noted   Essential hypertension 07/23/2016   PSVT (paroxysmal supraventricular tachycardia) (HCC) 07/23/2016   Bradycardia 07/23/2016    Tsering Leaman,CHRIS, PTA 01/31/2021, 10:21 AM  [facility] [address] [place], [place], [address] Phone: [phone]   Fax:  [phone]  Name: SIVAN QUAST MRN: [id number] Date of Birth: 1946/03/26

## 2021-02-04 ENCOUNTER — Ambulatory Visit: Payer: Medicare HMO | Admitting: Physical Therapy

## 2021-02-04 ENCOUNTER — Other Ambulatory Visit: Payer: Self-pay

## 2021-02-04 DIAGNOSIS — M6281 Muscle weakness (generalized): Secondary | ICD-10-CM

## 2021-02-04 DIAGNOSIS — M25511 Pain in right shoulder: Secondary | ICD-10-CM | POA: Diagnosis not present

## 2021-02-04 DIAGNOSIS — M25512 Pain in left shoulder: Secondary | ICD-10-CM

## 2021-02-04 NOTE — Therapy (Signed)
[facility] [address] [place], [place], [address] Phone: [phone]   Fax:  [phone]  Physical Therapy Treatment  Patient Details  Name: LEONARD FEIGEL MRN: [id number] Date of Birth: 05-Jan-1946 Referring Provider (PT): Richardson Chiquito   Encounter Date: 02/04/2021   PT End of Session - 02/04/21 0853     Visit Number 3    Number of Visits 12    Date for PT Re-Evaluation 03/11/21    Authorization Type PROGRESS NOTE AT 10TH VISIT.  KX MODIFIER AFTER 15 VISITS.    PT Start Time 0815    PT Stop Time 0907    PT Time Calculation (min) 52 min    Activity Tolerance Patient tolerated treatment well    Behavior During Therapy [facility] for tasks assessed/performed             Past Medical History:  Diagnosis Date   Hypertension     Past Surgical History:  Procedure Laterality Date   COLONOSCOPY     COLONOSCOPY N/A 11/14/2016   Procedure: COLONOSCOPY;  Surgeon: Corbin Ade, MD;  [facility];  Service: Endoscopy;  Laterality: N/A;  730    TONSILLECTOMY     as a kid    There were no vitals filed for this visit.   Subjective Assessment - 02/04/21 0931     Subjective COVID-19 screen performed prior to patient entering clinic.  No left shoulder pain, some right posterior shoulder pain.    Currently in Pain? Yes    Pain Score 2     Pain Location Shoulder    Pain Orientation Right    Pain Descriptors / Indicators Dull                               [facility] Adult PT Treatment/Exercise - 02/04/21 0001       Exercises   Exercises Shoulder      Shoulder Exercises: ROM/Strengthening   UBE (Upper Arm Bike) 90 RPM's x 8 minutes (4 mins forward and 4 mins backward).      Modalities   Modalities Electrical Stimulation;Ultrasound      Programme researcher, broadcasting/film/video Location RT post cuff region.    Electrical Stimulation Action 80-150 Hz. x 20 minutes (5 sec on and 5 sec  off).      Ultrasound   Ultrasound Location Right posterior cuff region.    Ultrasound Parameters Combo e'stim/US at 1.50 W/CM2 x 8 minutes.      Manual Therapy   Manual Therapy Soft tissue mobilization    Soft tissue mobilization STW/M including IASTM x 7 minutes to patient's right posterior cuff region.                         PT Long Term Goals - 01/28/21 0943       PT LONG TERM GOAL #1   Title Ind with HEP.    Time 6    Period Weeks    Status New      PT LONG TERM GOAL #2   Title Patient able to perform ADLs with pain not greater than 2/10 in bilateral UE's.    Time 6    Period Weeks    Status New                   Plan - 02/04/21 0858     Clinical Impression  Statement Patient reporting no left shoulder pain.  Some pain in right posterior cuff region.    Personal Factors and Comorbidities Comorbidity 1;Other    Comorbidities HTN.    Examination-Participation Restrictions Other    Stability/Clinical Decision Making Stable/Uncomplicated    PT Next Visit Plan Bilateral shoulder strengthening and modalties and STW/M as needed.             Patient will benefit from skilled therapeutic intervention in order to improve the following deficits and impairments:     Visit Diagnosis: Acute pain of right shoulder  Acute pain of left shoulder  Muscle weakness (generalized)     Problem List Patient Active Problem List   Diagnosis Date Noted   Essential hypertension 07/23/2016   PSVT (paroxysmal supraventricular tachycardia) (HCC) 07/23/2016   Bradycardia 07/23/2016    Taetum Flewellen, [place] MPT 02/04/2021, 9:33 AM  [facility] [address] [place], [place], [address] Phone: [phone]   Fax:  [phone]  Name: TYNELL WINCHELL MRN: [id number] Date of Birth: [birth year]

## 2021-02-07 ENCOUNTER — Other Ambulatory Visit: Payer: Self-pay

## 2021-02-07 ENCOUNTER — Ambulatory Visit: Payer: Medicare HMO | Admitting: *Deleted

## 2021-02-07 DIAGNOSIS — M25512 Pain in left shoulder: Secondary | ICD-10-CM

## 2021-02-07 DIAGNOSIS — M6281 Muscle weakness (generalized): Secondary | ICD-10-CM

## 2021-02-07 DIAGNOSIS — M25511 Pain in right shoulder: Secondary | ICD-10-CM | POA: Diagnosis not present

## 2021-02-07 NOTE — Therapy (Signed)
[facility] [address] [place], [place], [address] Phone: [phone]   Fax:  [phone]  Physical Therapy Treatment  Patient Details  Name: Stephen Hull MRN: [id number] Date of Birth: 03-Oct-1945 Referring Provider (PT): Richardson Chiquito   Encounter Date: 02/07/2021   PT End of Session - 02/07/21 0854     Visit Number 4    Number of Visits 12    Date for PT Re-Evaluation 03/11/21    Authorization Type PROGRESS NOTE AT 10TH VISIT.  KX MODIFIER AFTER 15 VISITS.    PT Start Time 0815    PT Stop Time 0901    PT Time Calculation (min) 46 min             Past Medical History:  Diagnosis Date   Hypertension     Past Surgical History:  Procedure Laterality Date   COLONOSCOPY     COLONOSCOPY N/A 11/14/2016   Procedure: COLONOSCOPY;  Surgeon: Corbin Ade, MD;  [facility];  Service: Endoscopy;  Laterality: N/A;  730    TONSILLECTOMY     as a kid    There were no vitals filed for this visit.   Subjective Assessment - 02/07/21 0825     Subjective COVID-19 screen performed prior to patient entering clinic.  Both shldrs 3-4/10    Pertinent History HTN.   small RTC tear RT shldr    Diagnostic tests MRI, X-ray    Patient Stated Goals Use bilateral UE's without pain.    Currently in Pain? Yes    Pain Score 3     Pain Location Shoulder    Pain Orientation Right    Pain Descriptors / Indicators Link [facility] Adult PT Treatment/Exercise - 02/07/21 0001       Exercises   Exercises Shoulder      Shoulder Exercises: Standing   Protraction Strengthening;Both;20 reps;Theraband    Theraband Level (Shoulder Protraction) Level 1 (Yellow)    External Rotation Strengthening;Both;20 reps;Theraband    Theraband Level (Shoulder External Rotation) Level 1 (Yellow)    Internal Rotation Strengthening;Both;20 reps;Theraband    Theraband Level (Shoulder Internal  Rotation) Level 1 (Yellow)    Extension Strengthening;Both;20 reps;Theraband    Theraband Level (Shoulder Extension) Level 1 (Yellow)    Row Strengthening;Both;20 reps;Theraband    Theraband Level (Shoulder Row) Level 1 (Yellow)      Shoulder Exercises: Pulleys   Flexion 5 minutes    Other Pulley Exercises Standing UE ranger CW/CCW 3x10 Both shldrs      Shoulder Exercises: ROM/Strengthening   UBE (Upper Arm Bike) 90 RPM's x 8 minutes (4 mins forward and 4 mins backward).      Manual Therapy   Manual Therapy Soft tissue mobilization    Soft tissue mobilization STW/IASTM including IASTM x 8 minutes to patient's Bil.  posterior cuff region.and UTs                         PT Long Term Goals - 02/07/21 8850       PT LONG TERM GOAL #1   Title Ind with HEP.    Time 6    Period Weeks    Status On-going      PT LONG TERM GOAL #2   Title Patient able to perform  ADLs with pain not greater than 2/10 in bilateral UE's.    Time 6    Period Weeks    Status On-going                   Plan - 02/07/21 0911     Clinical Impression Statement Pt arrived today doing fairly well with some soreness in both shldrs 3-4/10. Rx focused on AROM/AAROM and light strengthening close to the body wiyh yellow tband. IASTM to bil.posterior-cuff and UT area.  Decreased  pain end of Rx    Personal Factors and Comorbidities Comorbidity 1;Other    Comorbidities HTN.    Stability/Clinical Decision Making Stable/Uncomplicated    Rehab Potential Excellent    PT Frequency 2x / week    PT Duration 6 weeks    PT Treatment/Interventions ADLs/Self Care Home Management;Electrical Stimulation;Cryotherapy;Ultrasound;Therapeutic exercise;Therapeutic activities;Patient/family education;Manual techniques;Vasopneumatic Device    PT Next Visit Plan Bilateral shoulder strengthening and modalties and STW/M as needed.    Consulted and Agree with Plan of Care Patient             Patient will  benefit from skilled therapeutic intervention in order to improve the following deficits and impairments:  Pain, Decreased activity tolerance, Decreased strength  Visit Diagnosis: Acute pain of right shoulder  Acute pain of left shoulder  Muscle weakness (generalized)     Problem List Patient Active Problem List   Diagnosis Date Noted   Essential hypertension 07/23/2016   PSVT (paroxysmal supraventricular tachycardia) (HCC) 07/23/2016   Bradycardia 07/23/2016    Cloteal Isaacson,CHRIS, PTA 02/07/2021, 9:30 AM  [facility] [address] [place], [place], [address] Phone: [phone]   Fax:  [phone]  Name: Stephen Hull MRN: [id number] Date of Birth: 08-18-1945

## 2021-02-11 ENCOUNTER — Ambulatory Visit: Payer: Medicare HMO | Admitting: Physical Therapy

## 2021-02-11 ENCOUNTER — Other Ambulatory Visit: Payer: Self-pay

## 2021-02-11 DIAGNOSIS — M25511 Pain in right shoulder: Secondary | ICD-10-CM

## 2021-02-11 DIAGNOSIS — M6281 Muscle weakness (generalized): Secondary | ICD-10-CM

## 2021-02-11 DIAGNOSIS — M25512 Pain in left shoulder: Secondary | ICD-10-CM

## 2021-02-11 NOTE — Therapy (Signed)
[facility] [address] [place], [place], [address] Phone: [phone]   Fax:  [phone]  Physical Therapy Treatment  Patient Details  Name: Stephen Hull MRN: [id number] Date of Birth: 1945/11/13 Referring Provider (PT): Richardson Chiquito   Encounter Date: 02/11/2021   PT End of Session - 02/11/21 1420     Visit Number 5    Number of Visits 12    Date for PT Re-Evaluation 03/11/21    Authorization Type PROGRESS NOTE AT 10TH VISIT.  KX MODIFIER AFTER 15 VISITS.    PT Start Time 0145    PT Stop Time 0226    PT Time Calculation (min) 41 min    Activity Tolerance Patient tolerated treatment well    Behavior During Therapy [facility] for tasks assessed/performed             Past Medical History:  Diagnosis Date   Hypertension     Past Surgical History:  Procedure Laterality Date   COLONOSCOPY     COLONOSCOPY N/A 11/14/2016   Procedure: COLONOSCOPY;  Surgeon: Corbin Ade, MD;  [facility];  Service: Endoscopy;  Laterality: N/A;  730    TONSILLECTOMY     as a kid    There were no vitals filed for this visit.   Subjective Assessment - 02/11/21 1356     Subjective COVID-19 screen performed prior to patient entering clinic.  Patient arrived with no pain yet pain can increase up with certain movements    Pertinent History HTN.   small RTC tear RT shldr    Diagnostic tests MRI, X-ray    Patient Stated Goals Use bilateral UE's without pain.    Currently in Pain? No/denies                               [facility] Adult PT Treatment/Exercise - 02/11/21 0001       Shoulder Exercises: Seated   Other Seated Exercises bil UE flex/abd with yellow band 2x10      Shoulder Exercises: Standing   Protraction Strengthening;Both;20 reps;Theraband    Theraband Level (Shoulder Protraction) Level 2 (Red)    Horizontal ABduction Strengthening;Both;20 reps;5 reps;Theraband    Theraband Level (Shoulder  Horizontal ABduction) Level 1 (Yellow)    External Rotation Strengthening;Both;20 reps;Theraband    Theraband Level (Shoulder External Rotation) Level 2 (Red)    Internal Rotation Strengthening;Both;20 reps;Theraband    Theraband Level (Shoulder Internal Rotation) Level 2 (Red)    ABduction Strengthening;Both;Weights    Shoulder ABduction Weight (lbs) 2    ABduction Limitations 2x10    Extension Strengthening;Both;20 reps;Theraband    Theraband Level (Shoulder Extension) Level 2 (Red)    Diagonals Strengthening;Both;20 reps   4# 2x10 each UE   Other Standing Exercises standing scaption 2# bil 2x10    Other Standing Exercises bil 4# drivers 2x fatigue      Shoulder Exercises: Pulleys   Flexion 5 minutes      Shoulder Exercises: ROM/Strengthening   UBE (Upper Arm Bike) 90 RPM's x 8 minutes (4 mins forward and 4 mins backward).    Wall Pushups 20 reps                         PT Long Term Goals - 02/07/21 7341       PT LONG TERM GOAL #1   Title Ind with HEP.    Time 6  Period Weeks    Status On-going      PT LONG TERM GOAL #2   Title Patient able to perform ADLs with pain not greater than 2/10 in bilateral UE's.    Time 6    Period Weeks    Status On-going                   Plan - 02/11/21 1417     Clinical Impression Statement Patient arrived with no pain today. Patient has reported some increased pain with certain movements and activity up to 09/21/08. Patient progressed with bil UE strengthening today. Patient is able to perform ADL's with greater ease. Goals progressing today.    Personal Factors and Comorbidities Comorbidity 1;Other    Comorbidities HTN.    Examination-Participation Restrictions Other    Stability/Clinical Decision Making Stable/Uncomplicated    Rehab Potential Excellent    PT Frequency 2x / week    PT Duration 6 weeks    PT Treatment/Interventions ADLs/Self Care Home Management;Electrical  Stimulation;Cryotherapy;Ultrasound;Therapeutic exercise;Therapeutic activities;Patient/family education;Manual techniques;Vasopneumatic Device    PT Next Visit Plan Bilateral shoulder strengthening and modalties and STW/M as needed.    Consulted and Agree with Plan of Care Patient             Patient will benefit from skilled therapeutic intervention in order to improve the following deficits and impairments:  Pain, Decreased activity tolerance, Decreased strength  Visit Diagnosis: Acute pain of right shoulder  Acute pain of left shoulder  Muscle weakness (generalized)     Problem List Patient Active Problem List   Diagnosis Date Noted   Essential hypertension 07/23/2016   PSVT (paroxysmal supraventricular tachycardia) (HCC) 07/23/2016   Bradycardia 07/23/2016    Seddrick Flax P, PTA 02/11/2021, 2:28 PM  [facility] [address] [place], [place], [address] Phone: [phone]   Fax:  [phone]  Name: Stephen Hull MRN: [id number] Date of Birth: 16-Dec-1945

## 2021-02-12 ENCOUNTER — Ambulatory Visit: Payer: Medicare HMO | Admitting: Physical Therapy

## 2021-02-14 ENCOUNTER — Encounter: Payer: Self-pay | Admitting: Physical Therapy

## 2021-02-14 ENCOUNTER — Ambulatory Visit: Payer: Medicare HMO | Admitting: Physical Therapy

## 2021-02-14 ENCOUNTER — Other Ambulatory Visit: Payer: Self-pay

## 2021-02-14 DIAGNOSIS — M25511 Pain in right shoulder: Secondary | ICD-10-CM

## 2021-02-14 DIAGNOSIS — M6281 Muscle weakness (generalized): Secondary | ICD-10-CM

## 2021-02-14 DIAGNOSIS — M25512 Pain in left shoulder: Secondary | ICD-10-CM

## 2021-02-14 NOTE — Therapy (Signed)
[facility] [address] [place], [place], [address] Phone: [phone]   Fax:  [phone]  Physical Therapy Treatment  Patient Details  Name: Stephen Hull MRN: [id number] Date of Birth: [birth year] Referring Provider (PT): Richardson Chiquito   Encounter Date: 02/14/2021   PT End of Session - 02/14/21 0819     Visit Number 6    Number of Visits 12    Date for PT Re-Evaluation 03/11/21    Authorization Type PROGRESS NOTE AT 10TH VISIT.  KX MODIFIER AFTER 15 VISITS.    PT Start Time 0815    PT Stop Time 0855    PT Time Calculation (min) 40 min    Activity Tolerance Patient tolerated treatment well    Behavior During Therapy [facility] for tasks assessed/performed             Past Medical History:  Diagnosis Date   Hypertension     Past Surgical History:  Procedure Laterality Date   COLONOSCOPY     COLONOSCOPY N/A 11/14/2016   Procedure: COLONOSCOPY;  Surgeon: Corbin Ade, MD;  [facility];  Service: Endoscopy;  Laterality: N/A;  730    TONSILLECTOMY     as a kid    There were no vitals filed for this visit.   Subjective Assessment - 02/14/21 0819     Subjective COVID-19 screen performed prior to patient entering clinic.  Patient arrived with no pain.    Pertinent History HTN.   small RTC tear RT shldr    Diagnostic tests MRI, X-ray    Patient Stated Goals Use bilateral UE's without pain.    Currently in Pain? No/denies                [facility] PT Assessment - 02/14/21 0001       Assessment   Medical Diagnosis Bilateral shouder pain    Referring Provider (PT) Richardson Chiquito      Precautions   Precautions None      Restrictions   Weight Bearing Restrictions No                           [facility] Adult PT Treatment/Exercise - 02/14/21 0001       Shoulder Exercises: Standing   Protraction Strengthening;Both;20 reps;Theraband    Theraband Level (Shoulder Protraction)  Level 2 (Red)    Horizontal ABduction Strengthening;Both;20 reps;5 reps;Theraband    Theraband Level (Shoulder Horizontal ABduction) Level 2 (Red)    External Rotation Strengthening;Both;20 reps;Theraband    Theraband Level (Shoulder External Rotation) Level 2 (Red)    Internal Rotation Strengthening;Both;20 reps;Theraband    Theraband Level (Shoulder Internal Rotation) Level 2 (Red)    Flexion Strengthening;Both;20 reps;Weights    Shoulder Flexion Weight (lbs) 2    ABduction Strengthening;Both;Weights;5 reps   stopped due to pain   Shoulder ABduction Weight (lbs) 2    Extension Strengthening;Both;20 reps;Theraband    Theraband Level (Shoulder Extension) Level 2 (Red)    Row Strengthening;Both;20 reps;Theraband    Theraband Level (Shoulder Row) Level 2 (Red)    Diagonals Strengthening;Both;20 reps;Theraband    Theraband Level (Shoulder Diagonals) Level 2 (Red)    Other Standing Exercises B shoulder scaption 2# x20 reps    Other Standing Exercises bil 5# drivers 2x fatigue      Shoulder Exercises: Pulleys   Flexion 5 minutes      Shoulder Exercises: ROM/Strengthening   UBE (Upper Arm Bike) 90 RPM's  x 8 minutes (4 mins forward and 4 mins backward).    Thumb Tacks with ball at wall into flex x20 reps; wall clocks BUE x10 reps    Wall Pushups 20 reps    Other ROM/Strengthening Exercises B shoulder upper cut with abd red theraband 2x10 reps    Other ROM/Strengthening Exercises B wall walks x2 RT red theraband                         PT Long Term Goals - 02/14/21 0854       PT LONG TERM GOAL #1   Title Ind with HEP.    Time 6    Period Weeks    Status Achieved      PT LONG TERM GOAL #2   Title Patient able to perform ADLs with pain not greater than 2/10 in bilateral UE's.    Time 6    Period Weeks    Status Achieved                   Plan - 02/14/21 0925     Clinical Impression Statement Patient continues to maintain no B shoulder pain upon  arrival. Patient progressed through more LE weights and resistance with only muscle fatigue. Patient able to achieve goals but requests another week in order to perfect technique. Patient uses TENS and ice at home for pain relief.    Personal Factors and Comorbidities Comorbidity 1;Other    Comorbidities HTN.    Examination-Participation Restrictions Other    Stability/Clinical Decision Making Stable/Uncomplicated    Rehab Potential Excellent    PT Frequency 2x / week    PT Duration 6 weeks    PT Treatment/Interventions ADLs/Self Care Home Management;Electrical Stimulation;Cryotherapy;Ultrasound;Therapeutic exercise;Therapeutic activities;Patient/family education;Manual techniques;Vasopneumatic Device    PT Next Visit Plan Bilateral shoulder strengthening and modalties and STW/M as needed.    Consulted and Agree with Plan of Care Patient             Patient will benefit from skilled therapeutic intervention in order to improve the following deficits and impairments:  Pain, Decreased activity tolerance, Decreased strength  Visit Diagnosis: Acute pain of right shoulder  Acute pain of left shoulder  Muscle weakness (generalized)     Problem List Patient Active Problem List   Diagnosis Date Noted   Essential hypertension 07/23/2016   PSVT (paroxysmal supraventricular tachycardia) (HCC) 07/23/2016   Bradycardia 07/23/2016    Marvell Fuller, PTA 02/14/2021, 9:36 AM  [facility] [address] [place], [place], [address] Phone: [phone]   Fax:  [phone]  Name: Stephen Hull MRN: [id number] Date of Birth: October 23, 1945

## 2021-02-18 ENCOUNTER — Telehealth: Payer: Self-pay | Admitting: *Deleted

## 2021-02-18 NOTE — Telephone Encounter (Signed)
Patient l/m to cx  02/19/2021 @ 8:15am;  no reason given

## 2021-02-19 ENCOUNTER — Ambulatory Visit: Payer: Medicare HMO | Admitting: Physical Therapy

## 2021-02-21 ENCOUNTER — Encounter: Payer: Self-pay | Admitting: Physical Therapy

## 2021-02-21 ENCOUNTER — Other Ambulatory Visit: Payer: Self-pay

## 2021-02-21 ENCOUNTER — Ambulatory Visit: Payer: Medicare HMO | Attending: Family | Admitting: Physical Therapy

## 2021-02-21 DIAGNOSIS — M25511 Pain in right shoulder: Secondary | ICD-10-CM | POA: Diagnosis present

## 2021-02-21 NOTE — Therapy (Signed)
[facility] [place], [place], [address] Phone: [phone]   Fax:  [phone]  Physical Therapy Treatment  Patient Details  Name: Stephen Hull MRN: [id number] Date of Birth: October 19, 1945 Referring Provider (PT): Javier Docker   Encounter Date: 02/21/2021   PT End of Session - 02/21/21 1135     Visit Number 7    Number of Visits 12    Date for PT Re-Evaluation 03/11/21    Authorization Type PROGRESS NOTE AT 10TH VISIT.  KX MODIFIER AFTER 15 VISITS.    PT Start Time 0903    PT Stop Time 0920    PT Time Calculation (min) 17 min    Activity Tolerance Patient tolerated treatment well    Behavior During Therapy [facility] for tasks assessed/performed             Past Medical History:  Diagnosis Date   Hypertension     Past Surgical History:  Procedure Laterality Date   COLONOSCOPY     COLONOSCOPY N/A 11/14/2016   Procedure: COLONOSCOPY;  Surgeon: Daneil Dolin, MD;  [facility];  Service: Endoscopy;  Laterality: N/A;  730    TONSILLECTOMY     as a kid    There were no vitals filed for this visit.   Subjective Assessment - 02/21/21 1041     Subjective COVID-19 screen performed prior to patient entering clinic.  Shoulder feeling good.  Doesn't feel he needs anymore therapy.    Pertinent History HTN.   small RTC tear RT shldr    Diagnostic tests MRI, X-ray    Patient Stated Goals Use bilateral UE's without pain.    Currently in Pain? Yes    Pain Score 0-No pain                               [facility] Adult PT Treatment/Exercise - 02/21/21 0001       Exercises   Exercises Shoulder      Shoulder Exercises: ROM/Strengthening   UBE (Upper Arm Bike) 10 minutes at 120 RPM's.                         PT Long Term Goals - 02/21/21 1136       PT LONG TERM GOAL #1   Title Ind with HEP.    Time 6    Period Weeks    Status Achieved      PT LONG TERM GOAL #2    Time 6    Period Weeks    Status Achieved                    Patient will benefit from skilled therapeutic intervention in order to improve the following deficits and impairments:     Visit Diagnosis: Acute pain of right shoulder     Problem List Patient Active Problem List   Diagnosis Date Noted   Essential hypertension 07/23/2016   PSVT (paroxysmal supraventricular tachycardia) ([place]) 07/23/2016   Bradycardia 07/23/2016   PHYSICAL THERAPY DISCHARGE SUMMARY  Visits from Start of Care: 7  Current functional level related to goals / functional outcomes: See above.   Remaining deficits: Goals met.  Patient very pleased with outcome.   Education / Equipment: HEP.   Patient agrees to discharge. Patient goals were met. Patient is being discharged due to meeting the stated rehab goals.   Gwendelyn Lanting,  [place] MPT 02/21/2021, 11:39 AM  [facility] [address] [place], [place], [address] Phone: [phone]   Fax:  [phone]  Name: WINDELL MUSSON MRN: [id number] Date of Birth: 13-May-1946

## 2021-05-10 ENCOUNTER — Telehealth: Payer: Self-pay | Admitting: Cardiovascular Disease

## 2021-05-10 NOTE — Telephone Encounter (Signed)
New Message:    Patient is having numbness in his face, been going on for about 2 weeks. He was een in [place] on 04-17-21 for numbness in his arm. The xray showed Cardiac Carotid Clarification.  He saw his Chiropractor this morning and he told him to see his Cardiologist, because what showed on his Xray. I made him an appointment for 11--17-22 with Callie. Please call to evaluate.

## 2021-05-10 NOTE — Telephone Encounter (Signed)
Pt called to report that he had facial and arm numbness and tingling that last few weeks... he denies chest pain, dizziness... he says he had an xray at the [place] and it showed issues with his C5 and C6 in his neck which could be contributing to his symptoms.   His PCP did not really address when he mentioned to her.. buit it also siad he had some carotid calcifications..   He has an appt with Marjie Skiff PA 06/06/21 and his Chiropractor suggested that he calls [place] and ask for a Carotid Ultrasound prior to his appt.   Will forward to Dr. Allyson Sabal for his review.

## 2021-05-10 NOTE — Telephone Encounter (Signed)
I would wait until he sees Callie before I order any more tests.  Pt advised and will call back if anything changes or worsens.

## 2021-05-23 NOTE — Progress Notes (Signed)
Cardiology Office Note:    Date:  06/06/2021   ID:  Stephen Hull, DOB 1945-12-13, MRN [id number]  PCP:  Richmond Campbell., PA-C  Cardiologist:  Nanetta Batty, MD  Electrophysiologist:  None   Referring MD: Richmond Campbell., PA-C   Chief Complaint: facial numbness  History of Present Illness:    Stephen Hull is a 75 y.o. male with a history of remote paroxysmal SVT, asymptomatic bradycardia, hypertension, and tobacco use (quit in 1991) who is followed by Dr. Gery Pray improvements today for evaluation of facial numbness.  Patient was referred to Dr. Allyson Sabal in 1991 for paroxysmal SVT. Cardiac catheterization at that time which was normal he was placed on a beta-blocker.  He was not seen again until 2018 when he was re-referred to Dr. Allyson Sabal for evaluation of asymptomatic bradycardia with a heart rate of 36 bpm and a recent office visit with his PCP.  He denied any chest pain or shortness of breath at that time and had lost 35 pounds in the last 6 months due to back problems.  He reported checking his vital signs frequently at home and had not noticed any recurrent bradycardia.  His heart rate 60 bpm at that visit; therefore, he was continued on his beta-blocker  Patient was last seen by Dr. Allyson Sabal in 07/2019 at which time he was doing well from a cardiac standpoint with no recurrent bradycardia or SVT.  He called our office on 05/10/2021 with reports of numbness in his face for 2 weeks.  He was seen at the [facility] and an x-ray was done which reportedly showed issues with his C5-C6; however, it also reportedly showed some signs of carotid calcifications. He saw his Chiropractor who suggested he follow-up with Cardiology for carotid ultrasounds.  Therefore, this appointment was made.  Patient states he has been having some left arm tingling/numbness as well as facial numbness for about the past month.  No other stroke like symptoms. He was seen at the [facility] for this and symptoms were  felt to likely be due to "issues with his C5-V6" as stated above. He has been doing well from a cardiac standpoint. No chest pain, shortness of breath, orthopnea, PND, lower extremity edema, palpitations, lightheadedness, dizziness, or syncope.  BP mildly elevated in the office today at 146/86 but patients states it is usually well controlled at home in the 120s/70s.   Past Medical History:  Diagnosis Date   Bradycardia    History of tobacco use    quit in 1991   Hypertension    Paroxysmal SVT (supraventricular tachycardia) (HCC)     Past Surgical History:  Procedure Laterality Date   COLONOSCOPY     COLONOSCOPY N/A 11/14/2016   Procedure: COLONOSCOPY;  Surgeon: Corbin Ade, MD;  Location: [facility];  Service: Endoscopy;  Laterality: N/A;  730    TONSILLECTOMY     as a kid    Current Medications: Current Meds  Medication Sig   acebutolol (SECTRAL) 200 MG capsule Take 1 capsule by mouth 2 (two) times daily.   atorvastatin (LIPITOR) 40 MG tablet Take 1 tablet (40 mg total) by mouth daily.   Cholecalciferol (VITAMIN D3) 2000 units TABS Take 2,000 Units by mouth daily.   dextromethorphan-guaiFENesin (MUCINEX DM) 30-600 MG 12hr tablet Take 1 tablet by mouth daily as needed for cough.   ibuprofen (ADVIL,MOTRIN) 200 MG tablet Take 400 mg by mouth every 6 (six) hours as needed for mild pain.   losartan (COZAAR)  50 MG tablet Take 1 tablet by mouth daily.   omeprazole (PRILOSEC) 40 MG capsule Take 1 capsule by mouth daily.   triamcinolone ointment (KENALOG) 0.5 % Apply twice daily to areas for 1-2 weeks     Allergies:   Cashew nut (anacardium occidentale) skin test   Social History   Socioeconomic History   Marital status: Married    Spouse name: Not on file   Number of children: Not on file   Years of education: Not on file   Highest education level: Not on file  Occupational History   Not on file  Tobacco Use   Smoking status: Former    Types: Cigarettes    Quit  date: 07/21/1989    Years since quitting: 31.8   Smokeless tobacco: Former    Quit date: 07/21/1989  Vaping Use   Vaping Use: Never used  Substance and Sexual Activity   Alcohol use: No   Drug use: No   Sexual activity: Not on file  Other Topics Concern   Not on file  Social History Narrative   Not on file   Social Determinants of Health   Financial Resource Strain: Not on file  Food Insecurity: Not on file  Transportation Needs: Not on file  Physical Activity: Not on file  Stress: Not on file  Social Connections: Not on file     Family History: The patient's family history includes Cancer in his mother; Cancer - Prostate in his father; Heart attack in his father. There is no history of Colon cancer.  ROS:   Please see the history of present illness.     EKGs/Labs/Other Studies Reviewed:    The following studies were reviewed today: N/A.  EKG:  EKG ordered today. EKG personally reviewed and demonstrates normal sinus rhythm, rate 75 bpm, with multiple PVCs but no acute ST/T changes. Normal axis. Normal PR and QRS intervals. QTc 413 ms.   Recent Labs: No results found for requested labs within last 8760 hours.  Recent Lipid Panel No results found for: CHOL, TRIG, HDL, CHOLHDL, VLDL, LDLCALC, LDLDIRECT  Physical Exam:    Vital Signs: BP (!) 146/86 (BP Location: Left Arm, Patient Position: Sitting, Cuff Size: Normal)   Pulse 75   Resp 20   Ht 5\' 11"  (1.803 m)   Wt 260 lb 6.4 oz (118.1 kg)   SpO2 96%   BMI 36.32 kg/m     Wt Readings from Last 3 Encounters:  06/06/21 260 lb 6.4 oz (118.1 kg)  07/26/19 252 lb (114.3 kg)  11/14/16 223 lb (101.2 kg)     General: 75 y.o. Caucasian male in no acute distress. HEENT: Normocephalic and atraumatic. Sclera clear.  Neck: Supple. No carotid bruits. No JVD. Heart: RRR. Distinct S1 and S2. No murmurs, gallops, or rubs. Radial pulses 2+ and equal bilaterally. Lungs: No increased work of breathing. Clear to ausculation  bilaterally. No wheezes, rhonchi, or rales.  Abdomen: Soft, non-distended, and non-tender to palpation.   MSK: Normal strength and tone for age.  Extremities: No lower extremity edema.    Skin: Warm and dry. Neuro: Alert and oriented x3. No focal deficits. Psych: Normal affect. Responds appropriately.  Assessment:    1. Facial numbness   2. Left arm numbness   3. Carotid artery calcification, unspecified laterality   4. Paroxysmal SVT (supraventricular tachycardia) (HCC)   5. History of bradycardia   6. Primary hypertension   7. Hyperlipidemia, unspecified hyperlipidemia type     Plan:  Facial/Left Arm Numbness Carotid Calcifications - Patient has had some facial numbness for the last couple of weeks.  He recently had a chest x-ray done at the [facility] which reportedly showed some problem with C4-C5 which was felt to likely be the cause of his symptoms. However, x-ray also reportedly showed calcifications of his carotid arteries. His chiropractor recommended carotid ultrasounds. - No other stroke like symptoms. Agree numbness may be secondary to C4-C5. Do not think numbness is due to any cardiac issues. No carotid bruits noted on exam but given calcifications on chest x-ray, will get carotid dopplers.  Paroxysmal SVT - Remote history of paroxysmal SVT 1990s.  - No recurrence. - Continue Acebutolol 200mg  twice daily.  History of Bradycardia - Isolated occurrence of bradycardia  with rates in the 30s in 2018. - No recurrence.  - Continue beta-blocker as above.  Hypertension - BP mildly elevated in the office today but well controlled at home. - Continue current medications: Losartan 50mg  daily and Acebutolol 200mg  twice daily.   Hyperlipidemia - Recent lipid panel on 05/29/2021: Total Cholesterol 191, Triglycerides 213, HDL 42, LDL 134.  - ASCVD 10 year risk is 29.1%. Therefore, will start Lipitor 68m daily. - Will repeat lipid panel and LFTs in 6-8  weeks.  Disposition: Follow up in 1 year.    Medication Adjustments/Labs and Tests Ordered: Current medicines are reviewed at length with the patient today.  Concerns regarding medicines are outlined above.  Orders Placed This Encounter  Procedures   Lipid panel   Hepatic function panel   EKG 12-Lead   VAS US CAROTID    Meds ordered this encounter  Medications   atorvastatin (LIPITOR) 40 MG tablet    Sig: Take 1 tablet (40 mg total) by mouth daily.    Dispense:  90 tablet    Refill:  3     Patient Instructions  Medication Instructions:  START Atorvastatin (Lipitor) 40 mg daily *If you need a refill on your cardiac medications before your next appointment, please call your pharmacy*  Lab Work: Your physician recommends that you return for lab work in 6-8 weeks 07/18/21-07/31/21:  Fasting Lipid Panel-DO NOT EAT OR DRINK PAST MIDNIGHT. OKAY TO HAVE WATER. Hepatic (Liver) function Test   If you have labs (blood work) drawn today and your tests are completely normal, you will receive your results only by: MyChart Message (if you have MyChart) OR A paper copy in the mail If you have any lab test that is abnormal or we need to change your treatment, we will call you to review the results.  Testing/Procedures: Your physician has requested that you have a carotid duplex. This test is an ultrasound of the carotid arteries in your neck. It looks at blood flow through these arteries that supply the brain with blood. Allow one hour for this exam. There are no restrictions or special instructions.  Please schedule within 3 months   Follow-Up: At [facility], you and your health needs are our priority.  As part of our continuing mission to provide you with exceptional heart care, we have created designated Provider Care Teams.  These Care Teams include your primary Cardiologist (physician) and Advanced Practice Providers (APPs -  Physician Assistants and Nurse Practitioners) who all  work together to provide you with the care you need, when you need it.  Your next appointment:   1 year(s)  The format for your next appointment:   In Person  Provider:   Nanetta Batty, MD  Other Instructions    Signed, Corrin Parker, PA-C  06/06/2021 1:16 PM    [facility]

## 2021-06-05 ENCOUNTER — Encounter: Payer: Self-pay | Admitting: Student

## 2021-06-06 ENCOUNTER — Ambulatory Visit: Payer: Medicare HMO | Admitting: Student

## 2021-06-06 ENCOUNTER — Encounter: Payer: Self-pay | Admitting: Student

## 2021-06-06 ENCOUNTER — Other Ambulatory Visit: Payer: Self-pay

## 2021-06-06 VITALS — BP 146/86 | HR 75 | Resp 20 | Ht 71.0 in | Wt 260.4 lb

## 2021-06-06 DIAGNOSIS — I6529 Occlusion and stenosis of unspecified carotid artery: Secondary | ICD-10-CM

## 2021-06-06 DIAGNOSIS — I471 Supraventricular tachycardia: Secondary | ICD-10-CM | POA: Diagnosis not present

## 2021-06-06 DIAGNOSIS — Z87898 Personal history of other specified conditions: Secondary | ICD-10-CM | POA: Diagnosis not present

## 2021-06-06 DIAGNOSIS — E785 Hyperlipidemia, unspecified: Secondary | ICD-10-CM

## 2021-06-06 DIAGNOSIS — R200 Anesthesia of skin: Secondary | ICD-10-CM

## 2021-06-06 DIAGNOSIS — I10 Essential (primary) hypertension: Secondary | ICD-10-CM | POA: Diagnosis not present

## 2021-06-06 MED ORDER — ATORVASTATIN CALCIUM 40 MG PO TABS: 40.0000 mg | ORAL_TABLET | Freq: Every day | ORAL | 3 refills | Status: DC

## 2021-06-06 NOTE — Patient Instructions (Signed)
Medication Instructions:  START Atorvastatin (Lipitor) 40 mg daily *If you need a refill on your cardiac medications before your next appointment, please call your pharmacy*  Lab Work: Your physician recommends that you return for lab work in 6-8 weeks 07/18/21-07/31/21:  Fasting Lipid Panel-DO NOT EAT OR DRINK PAST MIDNIGHT. OKAY TO HAVE WATER. Hepatic (Liver) function Test   If you have labs (blood work) drawn today and your tests are completely normal, you will receive your results only by: MyChart Message (if you have MyChart) OR A paper copy in the mail If you have any lab test that is abnormal or we need to change your treatment, we will call you to review the results.  Testing/Procedures: Your physician has requested that you have a carotid duplex. This test is an ultrasound of the carotid arteries in your neck. It looks at blood flow through these arteries that supply the brain with blood. Allow one hour for this exam. There are no restrictions or special instructions.  Please schedule within 3 months   Follow-Up: At [facility], you and your health needs are our priority.  As part of our continuing mission to provide you with exceptional heart care, we have created designated Provider Care Teams.  These Care Teams include your primary Cardiologist (physician) and Advanced Practice Providers (APPs -  Physician Assistants and Nurse Practitioners) who all work together to provide you with the care you need, when you need it.  Your next appointment:   1 year(s)  The format for your next appointment:   In Person  Provider:   Nanetta Batty, MD    Other Instructions

## 2021-06-13 ENCOUNTER — Observation Stay (HOSPITAL_BASED_OUTPATIENT_CLINIC_OR_DEPARTMENT_OTHER): Payer: Medicare HMO

## 2021-06-13 ENCOUNTER — Observation Stay (HOSPITAL_COMMUNITY): Payer: Medicare HMO

## 2021-06-13 ENCOUNTER — Inpatient Hospital Stay (HOSPITAL_COMMUNITY)
Admission: EM | Admit: 2021-06-13 | Discharge: 2021-06-19 | DRG: 023 | Disposition: A | Discharge status: Home / Self Care | Payer: Medicare HMO | Attending: Internal Medicine | Admitting: Internal Medicine

## 2021-06-13 ENCOUNTER — Emergency Department (HOSPITAL_COMMUNITY): Payer: Medicare HMO

## 2021-06-13 ENCOUNTER — Encounter (HOSPITAL_COMMUNITY): Payer: Self-pay | Admitting: Emergency Medicine

## 2021-06-13 ENCOUNTER — Other Ambulatory Visit: Payer: Self-pay

## 2021-06-13 DIAGNOSIS — R001 Bradycardia, unspecified: Secondary | ICD-10-CM | POA: Diagnosis present

## 2021-06-13 DIAGNOSIS — R29703 NIHSS score 3: Secondary | ICD-10-CM | POA: Diagnosis present

## 2021-06-13 DIAGNOSIS — I7771 Dissection of carotid artery: Secondary | ICD-10-CM | POA: Diagnosis not present

## 2021-06-13 DIAGNOSIS — Y92410 Unspecified street and highway as the place of occurrence of the external cause: Secondary | ICD-10-CM | POA: Diagnosis not present

## 2021-06-13 DIAGNOSIS — I63233 Cerebral infarction due to unspecified occlusion or stenosis of bilateral carotid arteries: Principal | ICD-10-CM | POA: Diagnosis present

## 2021-06-13 DIAGNOSIS — Z6835 Body mass index (BMI) 35.0-35.9, adult: Secondary | ICD-10-CM | POA: Diagnosis not present

## 2021-06-13 DIAGNOSIS — I63231 Cerebral infarction due to unspecified occlusion or stenosis of right carotid arteries: Secondary | ICD-10-CM | POA: Diagnosis present

## 2021-06-13 DIAGNOSIS — I129 Hypertensive chronic kidney disease with stage 1 through stage 4 chronic kidney disease, or unspecified chronic kidney disease: Secondary | ICD-10-CM | POA: Diagnosis present

## 2021-06-13 DIAGNOSIS — H3411 Central retinal artery occlusion, right eye: Secondary | ICD-10-CM | POA: Diagnosis present

## 2021-06-13 DIAGNOSIS — I6389 Other cerebral infarction: Secondary | ICD-10-CM

## 2021-06-13 DIAGNOSIS — N182 Chronic kidney disease, stage 2 (mild): Secondary | ICD-10-CM | POA: Diagnosis present

## 2021-06-13 DIAGNOSIS — G453 Amaurosis fugax: Secondary | ICD-10-CM | POA: Diagnosis present

## 2021-06-13 DIAGNOSIS — I6522 Occlusion and stenosis of left carotid artery: Secondary | ICD-10-CM | POA: Diagnosis present

## 2021-06-13 DIAGNOSIS — D72829 Elevated white blood cell count, unspecified: Secondary | ICD-10-CM | POA: Diagnosis not present

## 2021-06-13 DIAGNOSIS — E785 Hyperlipidemia, unspecified: Secondary | ICD-10-CM | POA: Diagnosis not present

## 2021-06-13 DIAGNOSIS — I471 Supraventricular tachycardia, unspecified: Secondary | ICD-10-CM | POA: Diagnosis present

## 2021-06-13 DIAGNOSIS — Z800 Family history of malignant neoplasm of digestive organs: Secondary | ICD-10-CM

## 2021-06-13 DIAGNOSIS — Z20822 Contact with and (suspected) exposure to covid-19: Secondary | ICD-10-CM | POA: Diagnosis not present

## 2021-06-13 DIAGNOSIS — H5461 Unqualified visual loss, right eye, normal vision left eye: Secondary | ICD-10-CM | POA: Diagnosis present

## 2021-06-13 DIAGNOSIS — I493 Ventricular premature depolarization: Secondary | ICD-10-CM | POA: Diagnosis present

## 2021-06-13 DIAGNOSIS — I6521 Occlusion and stenosis of right carotid artery: Secondary | ICD-10-CM | POA: Diagnosis present

## 2021-06-13 DIAGNOSIS — Z87891 Personal history of nicotine dependence: Secondary | ICD-10-CM | POA: Diagnosis not present

## 2021-06-13 DIAGNOSIS — E669 Obesity, unspecified: Secondary | ICD-10-CM | POA: Diagnosis present

## 2021-06-13 DIAGNOSIS — I639 Cerebral infarction, unspecified: Secondary | ICD-10-CM | POA: Diagnosis not present

## 2021-06-13 DIAGNOSIS — Z8249 Family history of ischemic heart disease and other diseases of the circulatory system: Secondary | ICD-10-CM

## 2021-06-13 DIAGNOSIS — Z7982 Long term (current) use of aspirin: Secondary | ICD-10-CM

## 2021-06-13 DIAGNOSIS — Z7902 Long term (current) use of antithrombotics/antiplatelets: Secondary | ICD-10-CM | POA: Diagnosis not present

## 2021-06-13 DIAGNOSIS — I10 Essential (primary) hypertension: Secondary | ICD-10-CM | POA: Diagnosis present

## 2021-06-13 HISTORY — DX: Cerebral infarction, unspecified: I63.9

## 2021-06-13 LAB — ECHOCARDIOGRAM COMPLETE
AR max vel: 2.3 cm2
AV Area VTI: 2.78 cm2
AV Area mean vel: 2.47 cm2
AV Mean grad: 2.7 mmHg
AV Peak grad: 5.7 mmHg
Ao pk vel: 1.19 m/s
Area-P 1/2: 3.31 cm2
Calc EF: 57.8 %
Height: 71 in
MV VTI: 1.9 cm2
S' Lateral: 3.5 cm
Single Plane A2C EF: 46.8 %
Single Plane A4C EF: 65.1 %
Weight: 4080 oz

## 2021-06-13 LAB — COMPREHENSIVE METABOLIC PANEL
ALT: 27 U/L (ref 0–44)
AST: 23 U/L (ref 15–41)
Albumin: 3.8 g/dL (ref 3.5–5.0)
Alkaline Phosphatase: 61 U/L (ref 38–126)
Anion gap: 8 (ref 5–15)
BUN: 23 mg/dL (ref 8–23)
CO2: 25 mmol/L (ref 22–32)
Calcium: 8.7 mg/dL — ABNORMAL LOW (ref 8.9–10.3)
Chloride: 106 mmol/L (ref 98–111)
Creatinine, Ser: 1.25 mg/dL — ABNORMAL HIGH (ref 0.61–1.24)
GFR, Estimated: >60 mL/min (ref >60)
Glucose, Bld: 106 mg/dL — ABNORMAL HIGH (ref 70–99)
Potassium: 3.9 mmol/L (ref 3.5–5.1)
Sodium: 139 mmol/L (ref 135–145)
Total Bilirubin: 1 mg/dL (ref 0.3–1.2)
Total Protein: 6.7 g/dL (ref 6.5–8.1)

## 2021-06-13 LAB — I-STAT CHEM 8, ED
BUN: 23 mg/dL (ref 8–23)
Calcium, Ion: 1.16 mmol/L (ref 1.15–1.40)
Chloride: 104 mmol/L (ref 98–111)
Creatinine, Ser: 1.2 mg/dL (ref 0.61–1.24)
Glucose, Bld: 105 mg/dL — ABNORMAL HIGH (ref 70–99)
HCT: 47 % (ref 39.0–52.0)
Hemoglobin: 16 g/dL (ref 13.0–17.0)
Potassium: 4.1 mmol/L (ref 3.5–5.1)
Sodium: 142 mmol/L (ref 135–145)
TCO2: 26 mmol/L (ref 22–32)

## 2021-06-13 LAB — DIFFERENTIAL
Abs Immature Granulocytes: 0.11 10*3/uL — ABNORMAL HIGH (ref 0.00–0.07)
Basophils Absolute: 0.1 10*3/uL (ref 0.0–0.1)
Basophils Relative: 1 %
Eosinophils Absolute: 0.5 10*3/uL (ref 0.0–0.5)
Eosinophils Relative: 4 %
Immature Granulocytes: 1 %
Lymphocytes Relative: 17 %
Lymphs Abs: 2 10*3/uL (ref 0.7–4.0)
Monocytes Absolute: 1 10*3/uL (ref 0.1–1.0)
Monocytes Relative: 9 %
Neutro Abs: 7.9 10*3/uL — ABNORMAL HIGH (ref 1.7–7.7)
Neutrophils Relative %: 68 %

## 2021-06-13 LAB — CBC
HCT: 45.9 % (ref 39.0–52.0)
Hemoglobin: 15 g/dL (ref 13.0–17.0)
MCH: 30.3 pg (ref 26.0–34.0)
MCHC: 32.7 g/dL (ref 30.0–36.0)
MCV: 92.7 fL (ref 80.0–100.0)
Platelets: 192 10*3/uL (ref 150–400)
RBC: 4.95 MIL/uL (ref 4.22–5.81)
RDW: 14.3 % (ref 11.5–15.5)
WBC: 11.6 10*3/uL — ABNORMAL HIGH (ref 4.0–10.5)
nRBC: 0 % (ref 0.0–0.2)

## 2021-06-13 LAB — URINALYSIS, ROUTINE W REFLEX MICROSCOPIC
Bilirubin Urine: NEGATIVE
Glucose, UA: NEGATIVE mg/dL
Hgb urine dipstick: NEGATIVE
Ketones, ur: NEGATIVE mg/dL
Leukocytes,Ua: NEGATIVE
Nitrite: NEGATIVE
Protein, ur: NEGATIVE mg/dL
Specific Gravity, Urine: <1.005 — ABNORMAL LOW (ref 1.005–1.030)
pH: 7 (ref 5.0–8.0)

## 2021-06-13 LAB — RAPID URINE DRUG SCREEN, HOSP PERFORMED
Amphetamines: NOT DETECTED
Barbiturates: NOT DETECTED
Benzodiazepines: NOT DETECTED
Cocaine: NOT DETECTED
Opiates: NOT DETECTED
Tetrahydrocannabinol: NOT DETECTED

## 2021-06-13 LAB — PROTIME-INR
INR: 1 (ref 0.8–1.2)
Prothrombin Time: 13.4 seconds (ref 11.4–15.2)

## 2021-06-13 LAB — APTT: aPTT: 29 seconds (ref 24–36)

## 2021-06-13 LAB — HEMOGLOBIN A1C
Hgb A1c MFr Bld: 5.4 % (ref 4.8–5.6)
Mean Plasma Glucose: 108.28 mg/dL

## 2021-06-13 LAB — RESP PANEL BY RT-PCR (FLU A&B, COVID) ARPGX2
Influenza A by PCR: NEGATIVE
Influenza B by PCR: NEGATIVE
SARS Coronavirus 2 by RT PCR: NEGATIVE

## 2021-06-13 LAB — ETHANOL: Alcohol, Ethyl (B): <10 mg/dL (ref <10)

## 2021-06-13 IMAGING — MR MR MRA HEAD W/O CM
17 of 23 series · 36 of 48 positions shown · IV contrast (gadavist)
Comparison: Head and neck CTA [DATE]

CLINICAL DATA: Stroke, follow-up.  Right-sided vision loss.

EXAM:
MRI HEAD WITHOUT CONTRAST
MRA HEAD WITHOUT CONTRAST
MRA OF THE NECK WITHOUT AND WITH CONTRAST
TECHNIQUE: Multiplanar, multi-echo pulse sequences of the brain and surrounding
structures were acquired without intravenous contrast. Angiographic
images of the Circle of Willis were acquired using MRA technique
without intravenous contrast. Angiographic images of the neck were
acquired using MRA technique without and with intravenous contrast.
Carotid stenosis measurements (when applicable) are obtained
utilizing NASCET criteria, using the distal internal carotid
diameter as the denominator.
CONTRAST:  10mL GADAVIST GADOBUTROL 1 MMOL/ML IV SOLN

[Series 5: DWI · axial · 3.0mm · 0.88mm/px · z∈[-132,+21]mm · 2 of 104 slices shown (1 of 4)]
[im 1/104]
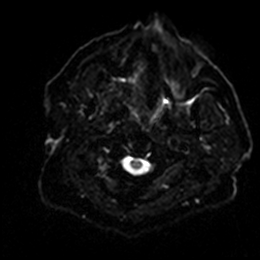
[im 104/104]
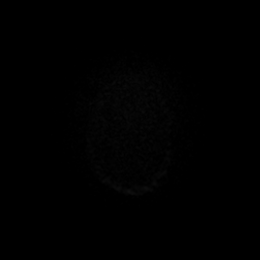

[Series 6: DWI · axial · 3.0mm · 0.88mm/px · 1 of 52 slices shown (2 of 4)]
[im 1/52]
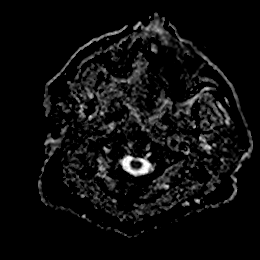

[Series 7: DWI · coronal · 4.0mm · 0.88mm/px · 2 of 72 slices shown (3 of 4)]
[im 1/72]
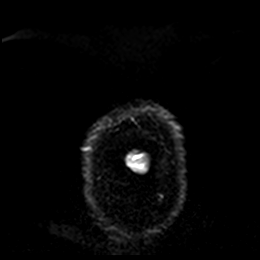
[im 72/72]
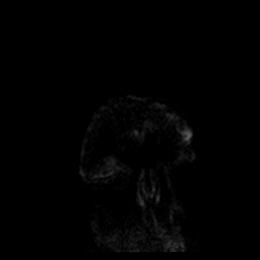

[Series 8: DWI · coronal · 4.0mm · 0.88mm/px · 1 of 36 slices shown (4 of 4)]
[im 1/36]
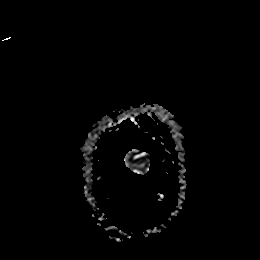

[Series 9: T1 · sagittal · 5.0mm · 0.75mm/px · 1 of 23 slices shown]
[im 1/23]
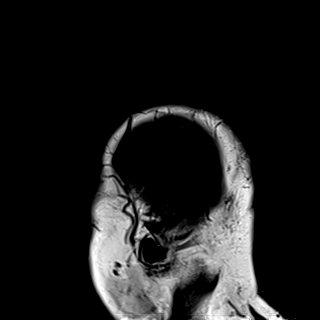

[Series 14: T2 · axial · 5.0mm · 0.72mm/px · 1 of 27 slices shown (1 of 2)]
[im 1/27]
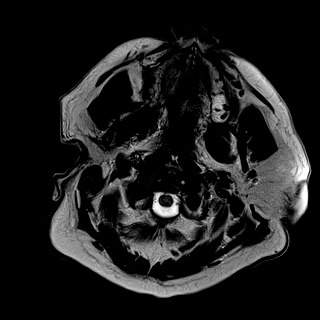

[Series 15: FLAIR · axial · 5.0mm · 0.45mm/px · 1 of 27 slices shown]
[im 1/27]
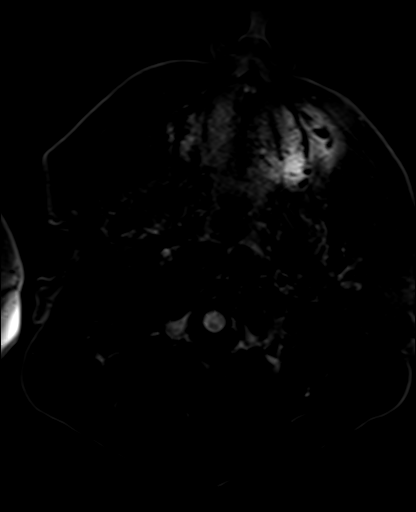

[Series 16: mag_images · axial · 3.0mm · 0.90mm/px · z∈[-141,+36]mm · 2 of 60 slices shown]
[im 1/60]
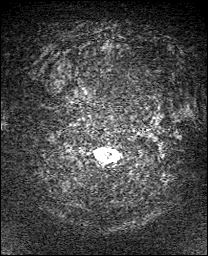
[im 60/60]
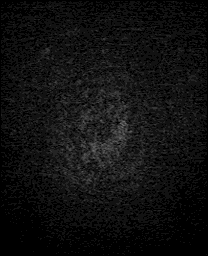

[Series 17: pha_images · axial · 3.0mm · 0.90mm/px · z∈[-141,+33]mm · 2 of 56 slices shown]
[im 1/56]
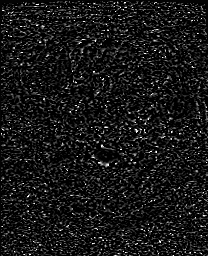
[im 56/56]
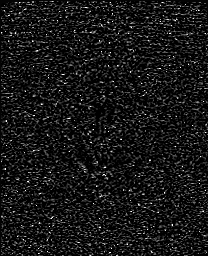

[Series 18: swi_images · axial · 3.0mm · 0.90mm/px · z∈[-141,+36]mm · 2 of 60 slices shown]
[im 1/60]
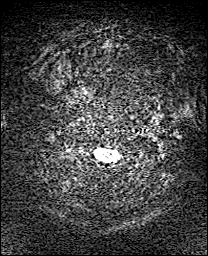
[im 60/60]
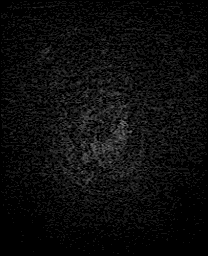

[Series 19: mip_images(sw) · axial · 24.0mm · 0.90mm/px · z∈[-130,+25]mm · 2 of 53 slices shown]
[im 1/53]
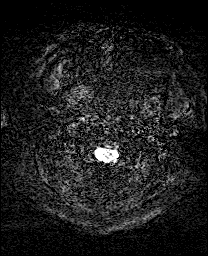
[im 53/53]
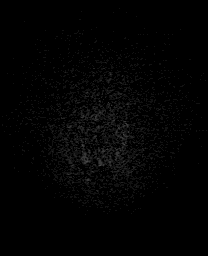

[Series 21: T2 · coronal · 5.0mm · 0.34mm/px · 1 of 29 slices shown (2 of 2)]
[im 1/29]
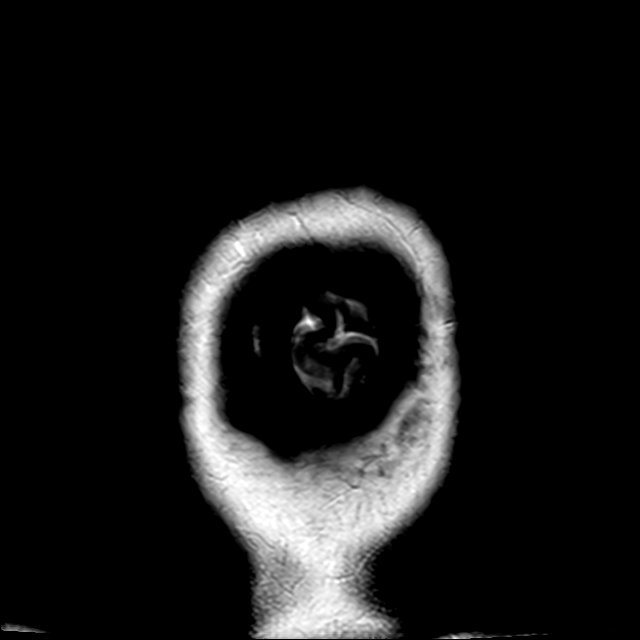

[Series 29: tof_fl3d_tra_iso · axial · 0.6mm · 0.52mm/px · z∈[-242,-126]mm · 6 of 195 slices shown]
[im 1/195]
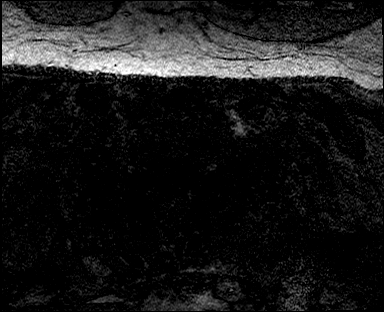
[im 39/195]
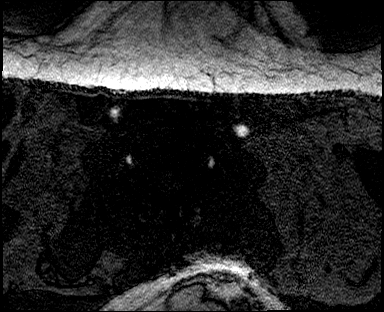
[im 78/195]
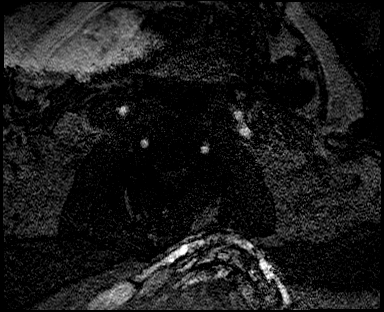
[im 117/195]
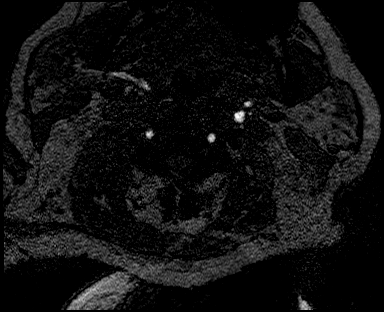
[im 156/195]
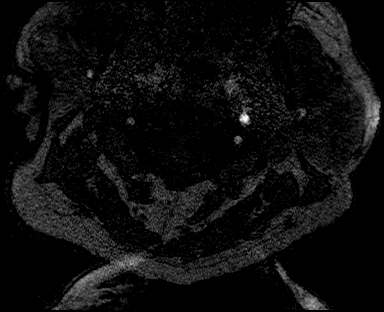
[im 195/195]
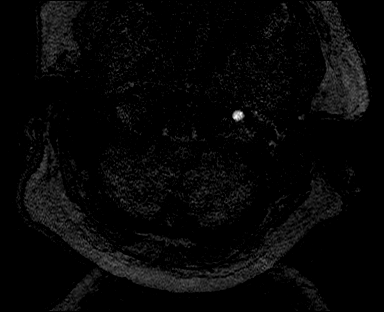

[Series 32: angio_fl3d_cor_pre_ttc=3.0s · coronal · 0.9mm · 0.85mm/px · 3 of 88 slices shown (1 of 2)]
[im 1/88]
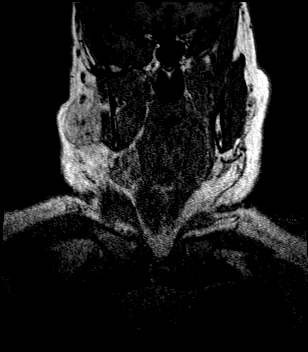
[im 44/88]
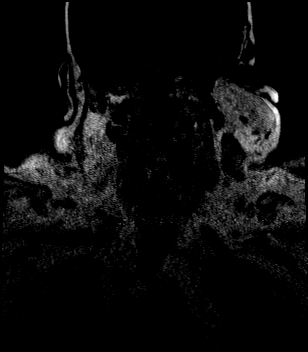
[im 88/88]
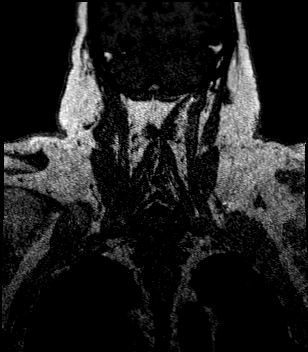

[Series 33: angio_fl3d_cor_pre_ttc=3.0s · coronal · 0.9mm · 0.85mm/px · 3 of 88 slices shown (2 of 2)]
[im 1/88]
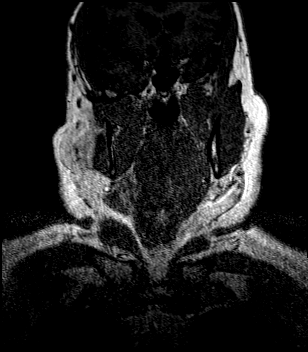
[im 44/88]
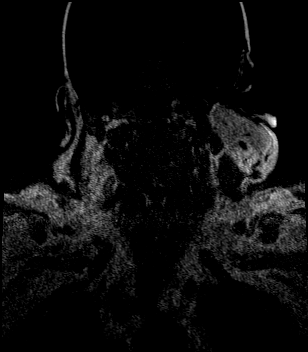
[im 88/88]
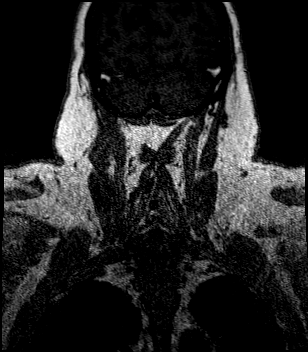

[Series 35: angio_fl3d_cor_post_ttc=3.0s · coronal · 0.9mm · 0.85mm/px · 3 of 84 slices shown (1 of 2)]
[im 1/84]
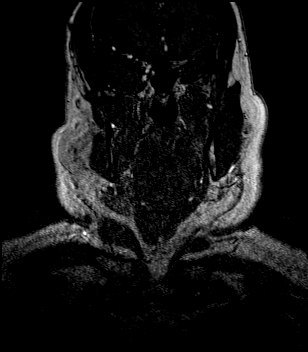
[im 42/84]
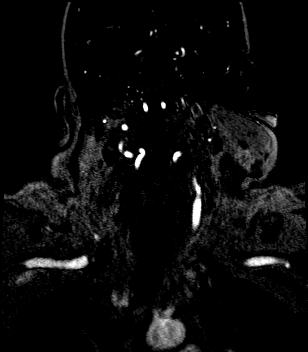
[im 84/84]
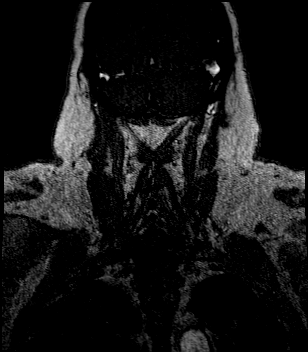

[Series 36: angio_fl3d_cor_post_ttc=3.0s · coronal · 0.9mm · 0.85mm/px · 3 of 88 slices shown (2 of 2)]
[im 1/88]
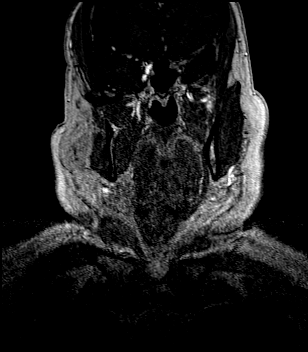
[im 44/88]
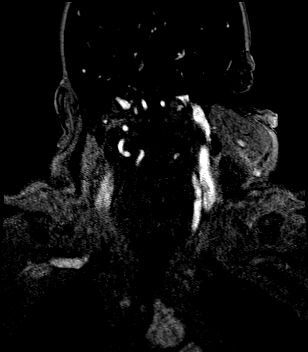
[im 88/88]
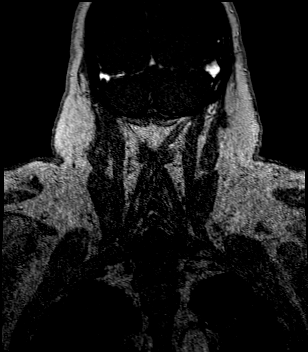

[36 of 48 positions shown; findings below may reference images not displayed]

FINDINGS: MR HEAD FINDINGS

Some sequences are mildly to moderately motion degraded.

Brain: There is a small acute infarct involving the right caudate
body, and there is question of a punctate acute subcortical white
matter infarct in the posterior right frontal lobe. T2
hyperintensities in the cerebral white matter nonspecific but
compatible with minimal chronic small vessel ischemic disease, not
advanced for age. Mild cerebral atrophy is within normal limits for
age. There are chronic lacunar infarcts in the left lentiform
nucleus and left cerebellar hemisphere.

Vascular: Known right ICA occlusion.

Skull and upper cervical spine: Unremarkable bone marrow signal.

Sinuses/Orbits: Unremarkable orbits. Paranasal sinuses and mastoid
air cells are clear.

Other: None.

MRA HEAD FINDINGS

Anterior circulation: The intracranial right ICA is occluded
proximally with reconstitution of the supraclinoid segment and
terminus. The intracranial left ICA is widely patent. ACAs and MCAs
are patent without evidence of a proximal branch occlusion or
significant M1 or left A1 stenosis. The right A1 segment is
hypoplastic. No aneurysm is identified.

Posterior circulation: The intracranial vertebral arteries are
widely patent to the basilar. The basilar artery is widely patent.
There is a patent right posterior communicating artery. Both PCAs
are patent without evidence of a significant proximal stenosis. No
aneurysm is identified.

Anatomic variants:None.

MRA NECK FINDINGS

Aortic arch: Common origin of the brachiocephalic and left common
carotid arteries. Widely patent arch vessel origins. Signal loss in
the proximal right subclavian artery, likely artifactual due to
vessel tortuosity and motion.

Right carotid system: Patent common carotid artery. Unchanged
proximal occlusion of the ICA without reconstitution in the neck.

Left carotid system: Signal loss in the proximal common carotid
artery, likely artifactual due to motion given normal appearance on
the earlier CTA. Wide patency of the more distal common carotid
artery. 50% stenosis of the ICA origin as shown on CTA.

Vertebral arteries: The vertebral arteries are patent and codominant
with antegrade flow bilaterally and without evidence of a
significant stenosis on the left. There is moderate proximal right
V2 stenosis which is due to extrinsic compression from uncovertebral
spurring based on CTA.
IMPRESSION: 1. Small acute infarct in the right caudate body.
2. Questionable punctate acute white matter infarct in the posterior
right frontal lobe.
3. Chronic lacunar infarcts in the left basal ganglia and
cerebellum.
4. Unchanged proximal right ICA occlusion with intracranial
reconstitution.
5. Unchanged 50% stenosis of the left ICA origin.

## 2021-06-13 IMAGING — CT CT ANGIO HEAD
3 of 14 series · 7 of 34 positions shown · IV contrast (Omnipaque or Isovue)
Comparison: Brain MRI [DATE]

CLINICAL DATA: Headache with right-sided vision loss. Numbness to
the right side of face and arm for several weeks

EXAM:
CT ANGIOGRAPHY HEAD AND NECK
TECHNIQUE: Multidetector CT imaging of the head and neck was performed using
the standard protocol during bolus administration of intravenous
contrast. Multiplanar CT image reconstructions and MIPs were
obtained to evaluate the vascular anatomy. Carotid stenosis
measurements (when applicable) are obtained utilizing NASCET
criteria, using the distal internal carotid diameter as the
denominator.
CONTRAST:  75mL OMNIPAQUE IOHEXOL 350 MG/ML SOLN

[Series 7: sagittal soft · sagittal · 0.33mm/px · 1 of 62 slices shown]
[im 32/62  soft-tissue]
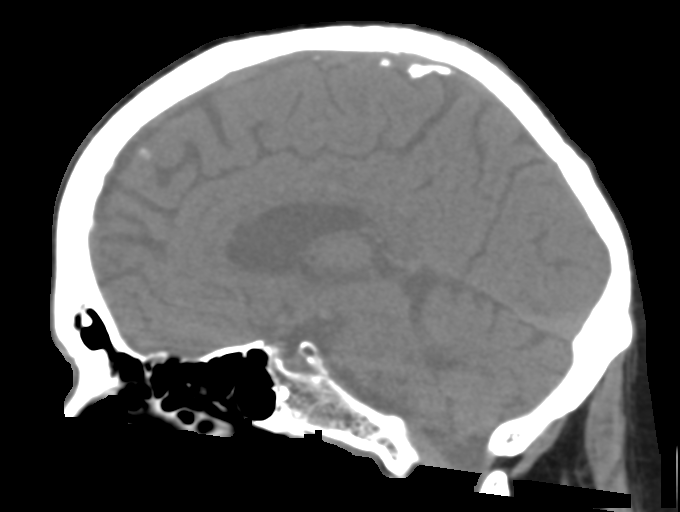

[Series 9: cta head & neck · axial · 0.39mm/px · z∈[-54,+112]mm · 3 of 669 slices shown]
[im 168/669  soft-tissue]
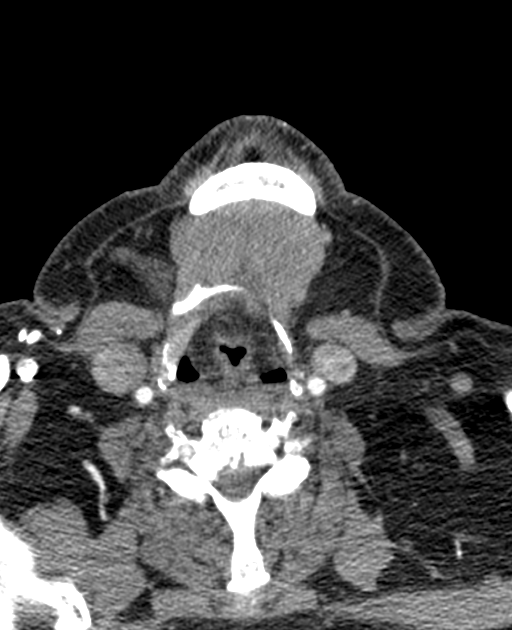
[im 335/669  soft-tissue]
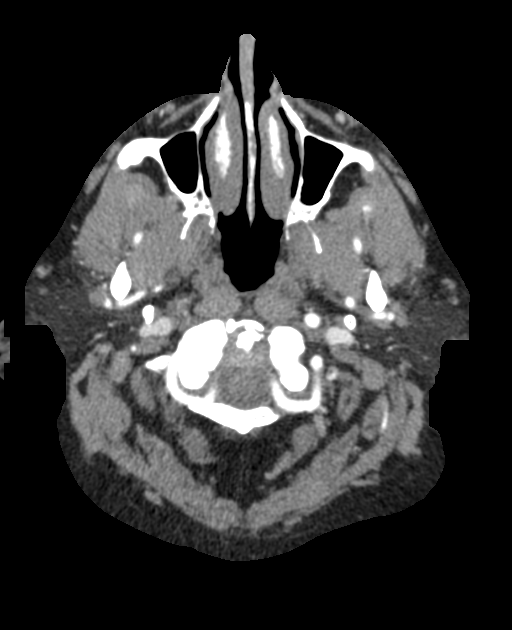
[im 502/669  soft-tissue]
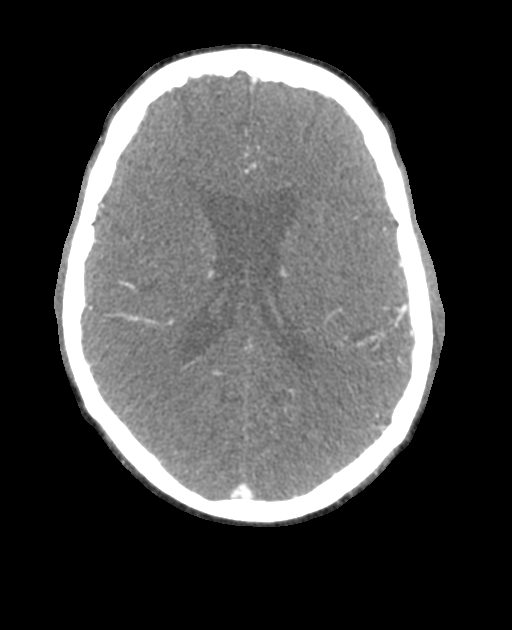

[Series 10: ax thins · axial · 0.39mm/px · z∈[-147,+195]mm · 3 of 344 slices shown]
[im 1/344  soft-tissue]
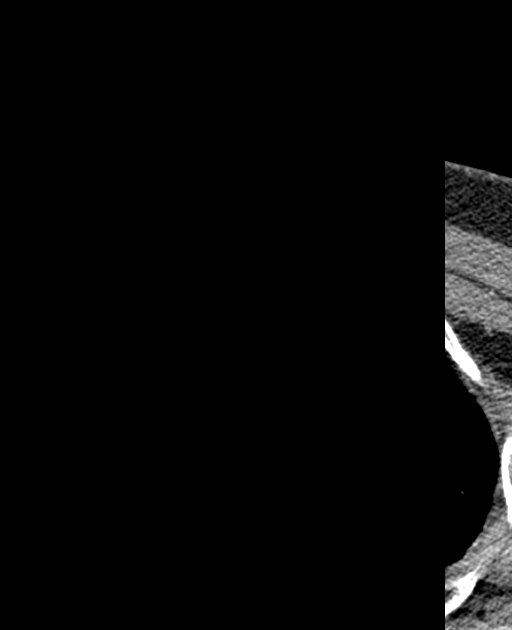
[im 172/344  bone]
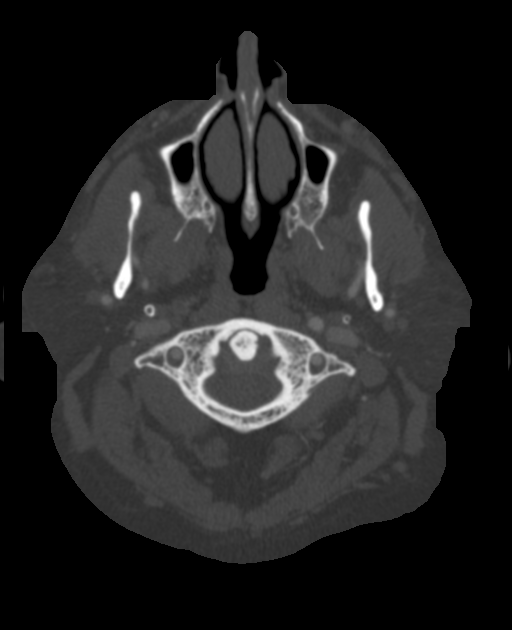
[im 344/344  soft-tissue]
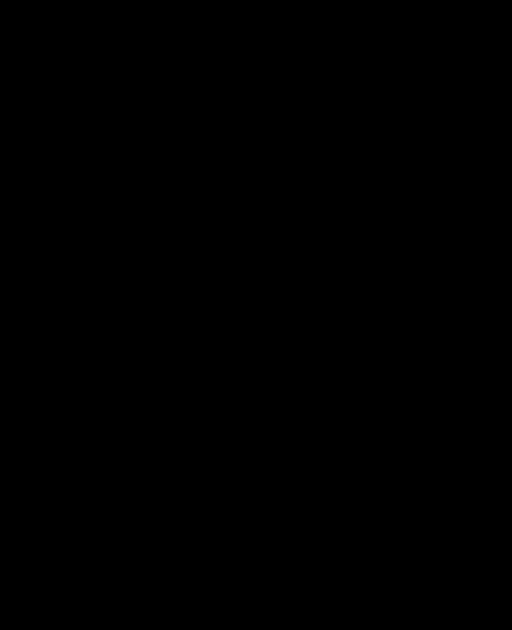

[7 of 34 positions shown; findings below may reference images not displayed]

FINDINGS: CT HEAD FINDINGS

Brain: No evidence of acute infarction, hemorrhage, hydrocephalus,
extra-axial collection or mass lesion/mass effect.

Vascular: No hyperdense vessel or unexpected calcification.

Skull: Normal. Negative for fracture or focal lesion.

Sinuses: Imaged portions are clear.

Orbits: No acute finding.

Review of the MIP images confirms the above findings

CTA NECK FINDINGS

Aortic arch: Atheromatous plaque. Minimal coverage. Likely 2 vessel
branching.

Right carotid system: Irregular low-density plaque with ulceration
at the bifurcation/bulb and with proximal ICA occlusion and no
detectable opacification of the downstream vessel until intracranial
reconstitution. Flow void was seen at this level on prior.

Left carotid system: Atheromatous wall thickening with mixed density
plaque at the bulb/bifurcation causing 50% stenosis.

Vertebral arteries: Subclavian atherosclerosis without flow limiting
stenosis. Codominant vertebral arteries. Extrinsic narrowing of the
vertebral arteries from uncovertebral spurs, especially on the
right. No intrinsic stenosis.

Skeleton: Generalized cervical spine degeneration. Generalized
osteopenia.

Other neck: Negative

Upper chest: Clear apical lungs

Review of the MIP images confirms the above findings

CTA HEAD FINDINGS

Anterior circulation: Right ICA reconstitution primarily at the
ophthalmic and supraclinoid segment. Symmetric enhancement of MCA
branches but hypoplastic right A1 segment and small right posterior
communicating artery. No downstream embolus is seen.

Posterior circulation: The vertebral and basilar arteries are
smoothly contoured and widely patent. Atheromatous plaque on the
vertebral arteries. No branch occlusion, beading, or aneurysm.

Venous sinuses: Negative

Anatomic variants: As above

Review of the MIP images confirms the above findings

Critical Value/emergent results were called by telephone at the time
of interpretation on [DATE] at [DATE] to provider Dr YOANA ,
who verbally acknowledged these results.
IMPRESSION: 1. Right proximal ICA occlusion, likely recent given the history.
There is intracranial reconstitution but nearly isolated right MCA
given hypoplastic right A1 segment and a small right posterior
communicating artery.
2. 50% atheromatous narrowing at the left ICA bulb.
3. Moderate right V2 segment narrowing due to extrinsic stenosis
from endplate spur.

## 2021-06-13 IMAGING — MR MR HEAD W/O CM
17 of 23 series · 36 of 48 positions shown · IV contrast (gadavist)
Comparison: Head and neck CTA [DATE]

CLINICAL DATA: Stroke, follow-up.  Right-sided vision loss.

EXAM:
MRI HEAD WITHOUT CONTRAST
MRA HEAD WITHOUT CONTRAST
MRA OF THE NECK WITHOUT AND WITH CONTRAST
TECHNIQUE: Multiplanar, multi-echo pulse sequences of the brain and surrounding
structures were acquired without intravenous contrast. Angiographic
images of the Circle of Willis were acquired using MRA technique
without intravenous contrast. Angiographic images of the neck were
acquired using MRA technique without and with intravenous contrast.
Carotid stenosis measurements (when applicable) are obtained
utilizing NASCET criteria, using the distal internal carotid
diameter as the denominator.
CONTRAST:  10mL GADAVIST GADOBUTROL 1 MMOL/ML IV SOLN

[Series 5: DWI · axial · 3.0mm · 0.88mm/px · z∈[-132,+21]mm · 2 of 104 slices shown (1 of 4)]
[im 1/104]
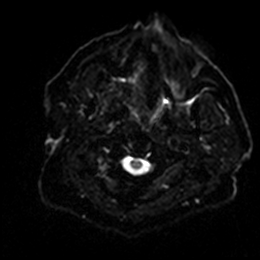
[im 104/104]
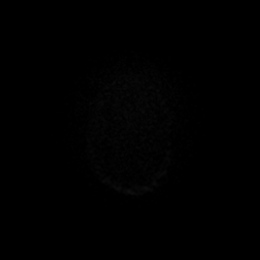

[Series 6: DWI · axial · 3.0mm · 0.88mm/px · 1 of 52 slices shown (2 of 4)]
[im 1/52]
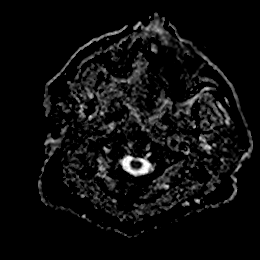

[Series 7: DWI · coronal · 4.0mm · 0.88mm/px · 2 of 72 slices shown (3 of 4)]
[im 1/72]
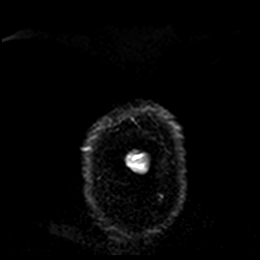
[im 72/72]
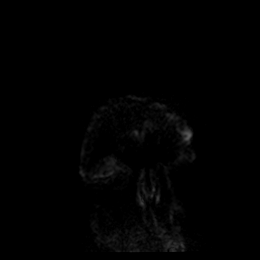

[Series 8: DWI · coronal · 4.0mm · 0.88mm/px · 1 of 36 slices shown (4 of 4)]
[im 1/36]
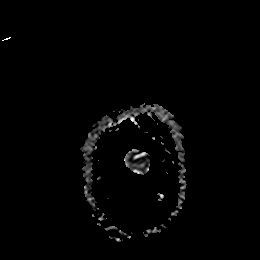

[Series 9: T1 · sagittal · 5.0mm · 0.75mm/px · 1 of 23 slices shown]
[im 1/23]
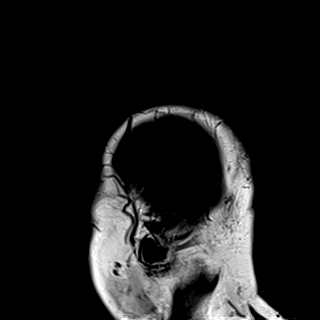

[Series 14: T2 · axial · 5.0mm · 0.72mm/px · 1 of 27 slices shown (1 of 2)]
[im 1/27]
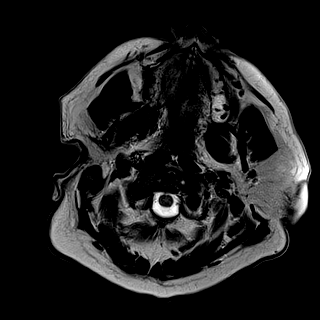

[Series 15: FLAIR · axial · 5.0mm · 0.45mm/px · 1 of 27 slices shown]
[im 1/27]
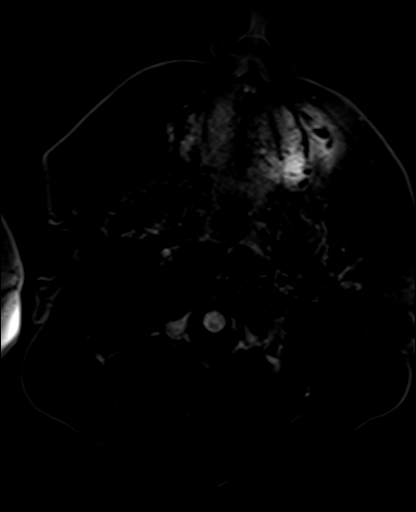

[Series 16: mag_images · axial · 3.0mm · 0.90mm/px · z∈[-141,+36]mm · 2 of 60 slices shown]
[im 1/60]
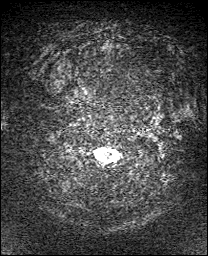
[im 60/60]
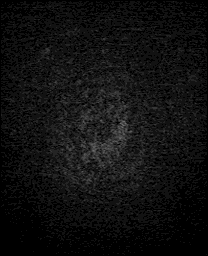

[Series 17: pha_images · axial · 3.0mm · 0.90mm/px · z∈[-141,+33]mm · 2 of 56 slices shown]
[im 1/56]
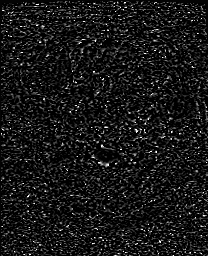
[im 56/56]
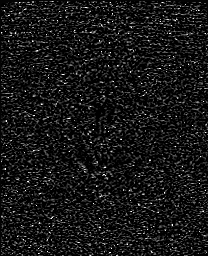

[Series 18: swi_images · axial · 3.0mm · 0.90mm/px · z∈[-141,+36]mm · 2 of 60 slices shown]
[im 1/60]
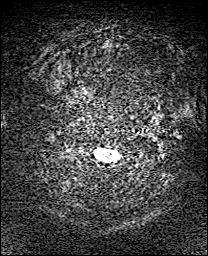
[im 60/60]
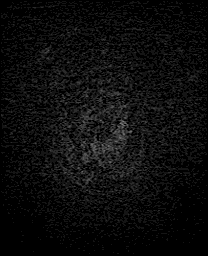

[Series 19: mip_images(sw) · axial · 24.0mm · 0.90mm/px · z∈[-130,+25]mm · 2 of 53 slices shown]
[im 1/53]
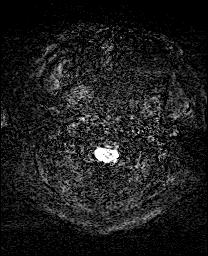
[im 53/53]
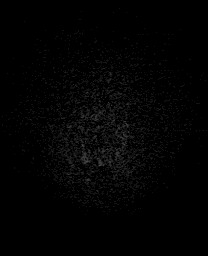

[Series 21: T2 · coronal · 5.0mm · 0.34mm/px · 1 of 29 slices shown (2 of 2)]
[im 1/29]
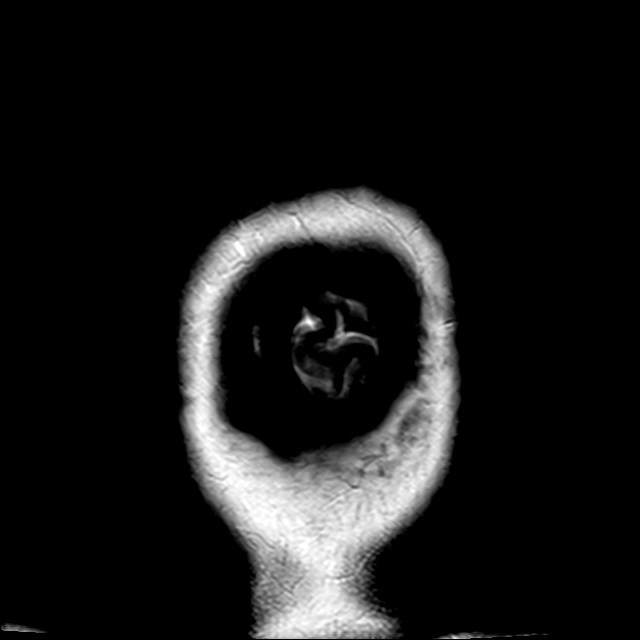

[Series 29: tof_fl3d_tra_iso · axial · 0.6mm · 0.52mm/px · z∈[-242,-126]mm · 6 of 195 slices shown]
[im 1/195]
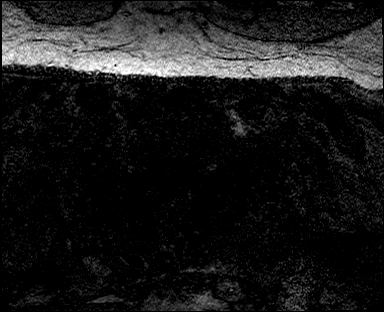
[im 39/195]
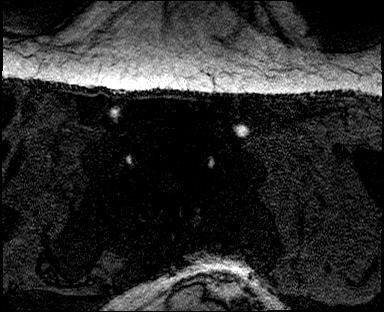
[im 78/195]
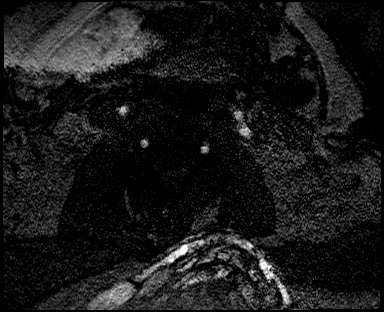
[im 117/195]
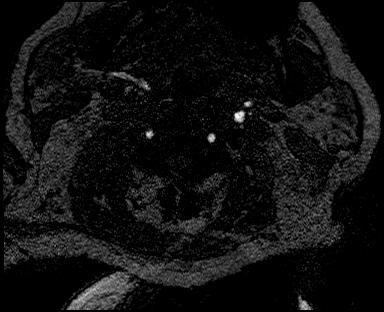
[im 156/195]
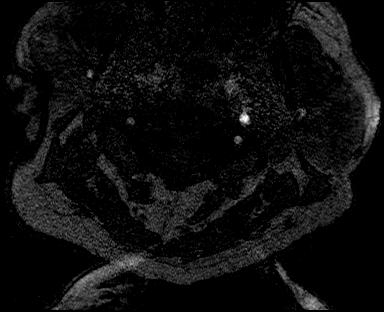
[im 195/195]
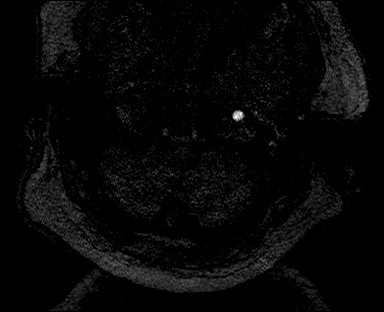

[Series 32: angio_fl3d_cor_pre_ttc=3.0s · coronal · 0.9mm · 0.85mm/px · 3 of 88 slices shown (1 of 2)]
[im 1/88]
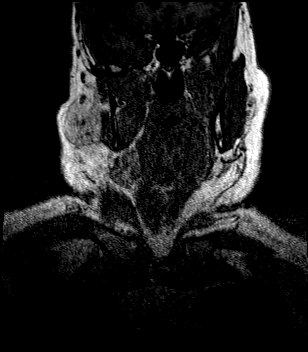
[im 44/88]
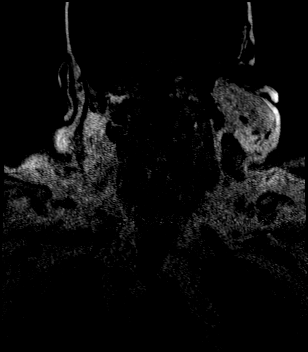
[im 88/88]
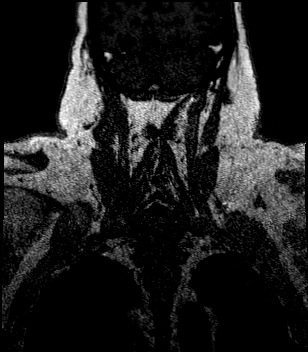

[Series 33: angio_fl3d_cor_pre_ttc=3.0s · coronal · 0.9mm · 0.85mm/px · 3 of 88 slices shown (2 of 2)]
[im 1/88]
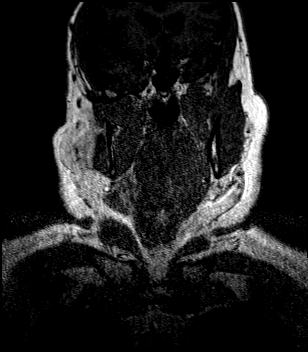
[im 44/88]
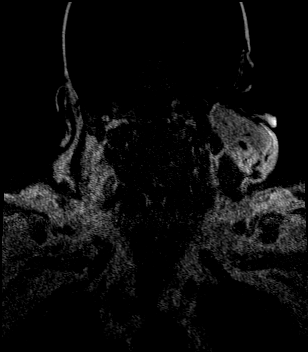
[im 88/88]
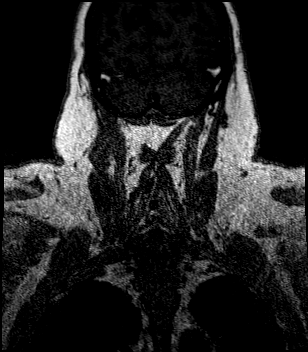

[Series 35: angio_fl3d_cor_post_ttc=3.0s · coronal · 0.9mm · 0.85mm/px · 3 of 84 slices shown (1 of 2)]
[im 1/84]
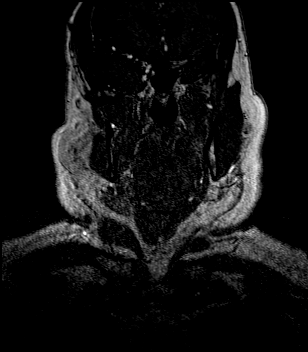
[im 42/84]
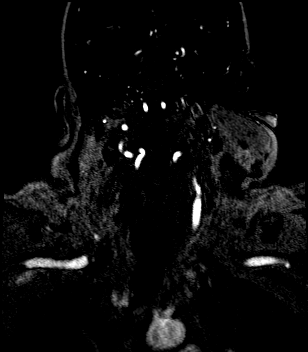
[im 84/84]
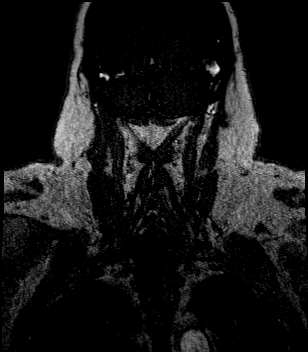

[Series 36: angio_fl3d_cor_post_ttc=3.0s · coronal · 0.9mm · 0.85mm/px · 3 of 88 slices shown (2 of 2)]
[im 1/88]
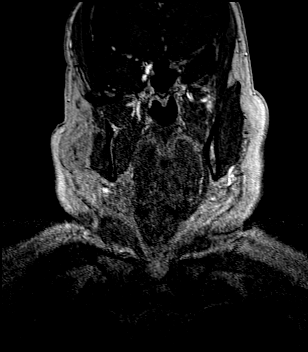
[im 44/88]
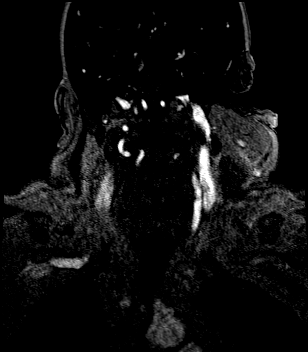
[im 88/88]
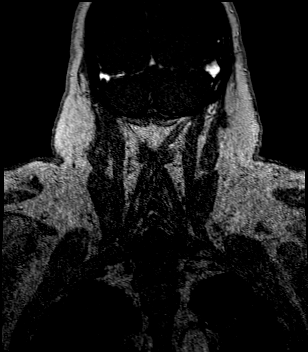

[36 of 48 positions shown; findings below may reference images not displayed]

FINDINGS: MR HEAD FINDINGS

Some sequences are mildly to moderately motion degraded.

Brain: There is a small acute infarct involving the right caudate
body, and there is question of a punctate acute subcortical white
matter infarct in the posterior right frontal lobe. T2
hyperintensities in the cerebral white matter nonspecific but
compatible with minimal chronic small vessel ischemic disease, not
advanced for age. Mild cerebral atrophy is within normal limits for
age. There are chronic lacunar infarcts in the left lentiform
nucleus and left cerebellar hemisphere.

Vascular: Known right ICA occlusion.

Skull and upper cervical spine: Unremarkable bone marrow signal.

Sinuses/Orbits: Unremarkable orbits. Paranasal sinuses and mastoid
air cells are clear.

Other: None.

MRA HEAD FINDINGS

Anterior circulation: The intracranial right ICA is occluded
proximally with reconstitution of the supraclinoid segment and
terminus. The intracranial left ICA is widely patent. ACAs and MCAs
are patent without evidence of a proximal branch occlusion or
significant M1 or left A1 stenosis. The right A1 segment is
hypoplastic. No aneurysm is identified.

Posterior circulation: The intracranial vertebral arteries are
widely patent to the basilar. The basilar artery is widely patent.
There is a patent right posterior communicating artery. Both PCAs
are patent without evidence of a significant proximal stenosis. No
aneurysm is identified.

Anatomic variants:None.

MRA NECK FINDINGS

Aortic arch: Common origin of the brachiocephalic and left common
carotid arteries. Widely patent arch vessel origins. Signal loss in
the proximal right subclavian artery, likely artifactual due to
vessel tortuosity and motion.

Right carotid system: Patent common carotid artery. Unchanged
proximal occlusion of the ICA without reconstitution in the neck.

Left carotid system: Signal loss in the proximal common carotid
artery, likely artifactual due to motion given normal appearance on
the earlier CTA. Wide patency of the more distal common carotid
artery. 50% stenosis of the ICA origin as shown on CTA.

Vertebral arteries: The vertebral arteries are patent and codominant
with antegrade flow bilaterally and without evidence of a
significant stenosis on the left. There is moderate proximal right
V2 stenosis which is due to extrinsic compression from uncovertebral
spurring based on CTA.
IMPRESSION: 1. Small acute infarct in the right caudate body.
2. Questionable punctate acute white matter infarct in the posterior
right frontal lobe.
3. Chronic lacunar infarcts in the left basal ganglia and
cerebellum.
4. Unchanged proximal right ICA occlusion with intracranial
reconstitution.
5. Unchanged 50% stenosis of the left ICA origin.

## 2021-06-13 IMAGING — CT CT ANGIO NECK
3 of 11 series · 8 of 34 positions shown · IV contrast (Omnipaque or Isovue)
Comparison: Brain MRI [DATE]

CLINICAL DATA: Headache with right-sided vision loss. Numbness to
the right side of face and arm for several weeks

EXAM:
CT ANGIOGRAPHY HEAD AND NECK
TECHNIQUE: Multidetector CT imaging of the head and neck was performed using
the standard protocol during bolus administration of intravenous
contrast. Multiplanar CT image reconstructions and MIPs were
obtained to evaluate the vascular anatomy. Carotid stenosis
measurements (when applicable) are obtained utilizing NASCET
criteria, using the distal internal carotid diameter as the
denominator.
CONTRAST:  75mL OMNIPAQUE IOHEXOL 350 MG/ML SOLN

[Series 7: sagittal soft · sagittal · 0.33mm/px · 1 of 62 slices shown]
[im 32/62  soft-tissue]
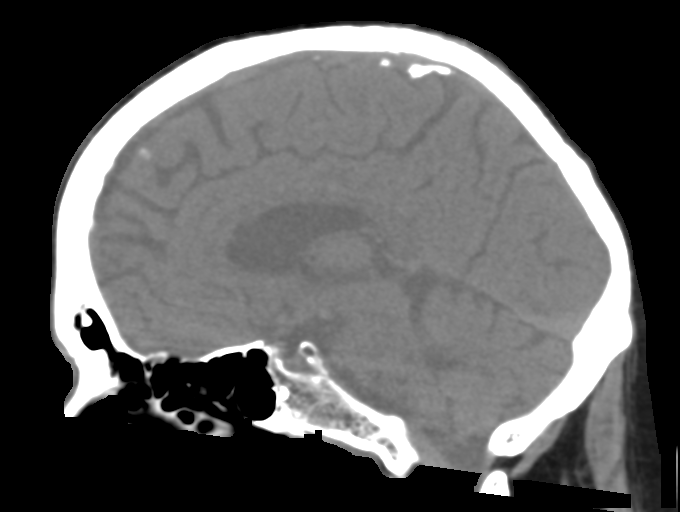

[Series 8: cta head & neck · axial · 0.53mm/px · z∈[-23,+89]mm · 2 of 168 slices shown]
[im 56/168  soft-tissue]
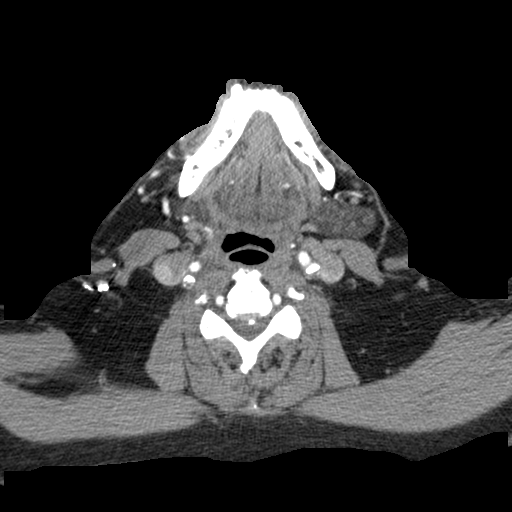
[im 112/168  soft-tissue]
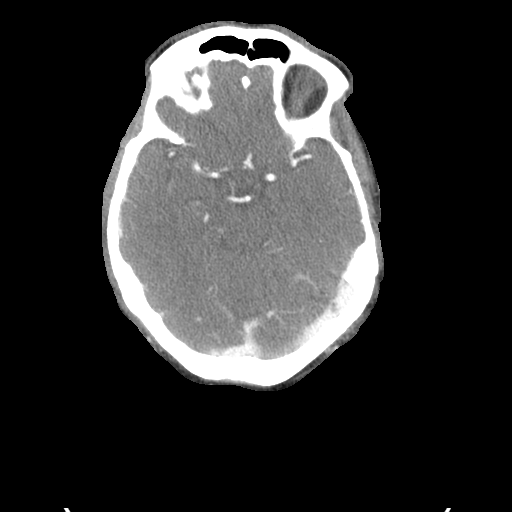

[Series 10: ax thins · axial · 0.39mm/px · z∈[-90,+138]mm · 5 of 344 slices shown]
[im 58/344  soft-tissue]
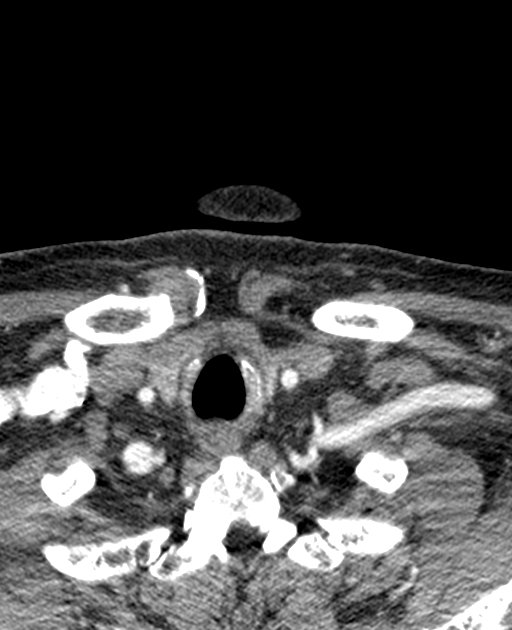
[im 115/344  bone]
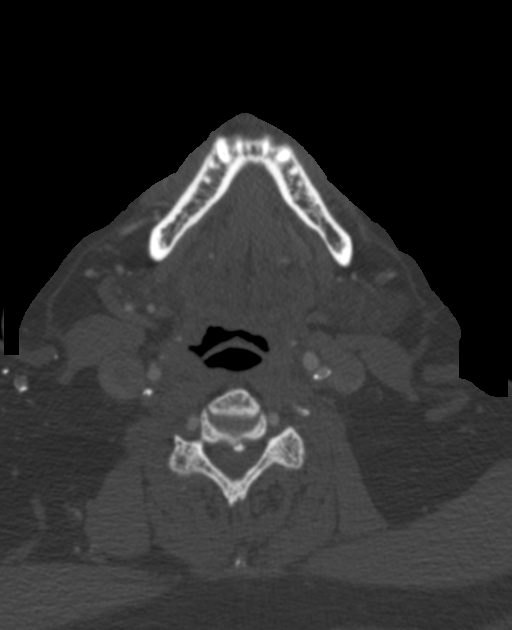
[im 172/344  soft-tissue]
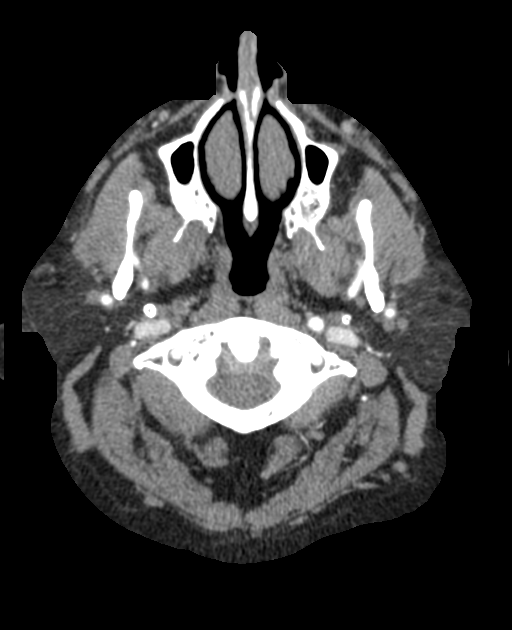
[im 229/344  bone]
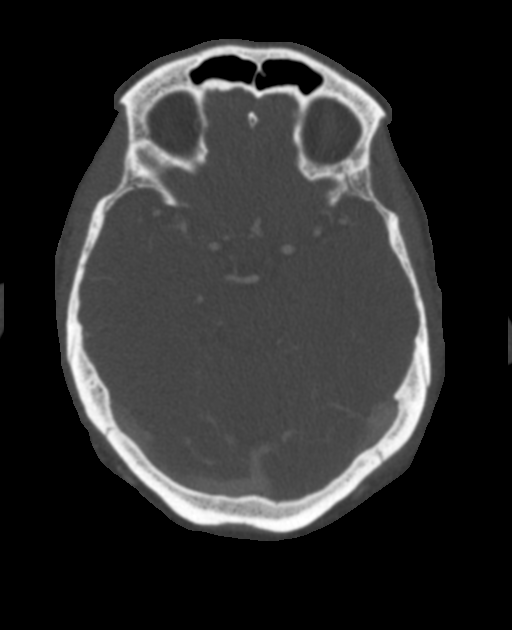
[im 286/344  soft-tissue]
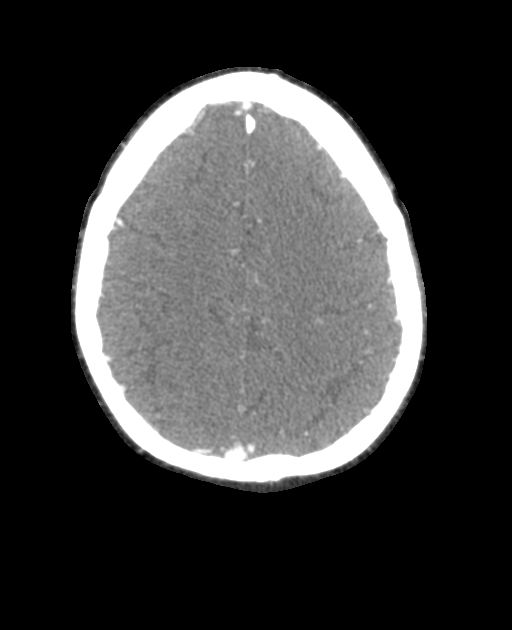

[8 of 34 positions shown; findings below may reference images not displayed]

FINDINGS: CT HEAD FINDINGS

Brain: No evidence of acute infarction, hemorrhage, hydrocephalus,
extra-axial collection or mass lesion/mass effect.

Vascular: No hyperdense vessel or unexpected calcification.

Skull: Normal. Negative for fracture or focal lesion.

Sinuses: Imaged portions are clear.

Orbits: No acute finding.

Review of the MIP images confirms the above findings

CTA NECK FINDINGS

Aortic arch: Atheromatous plaque. Minimal coverage. Likely 2 vessel
branching.

Right carotid system: Irregular low-density plaque with ulceration
at the bifurcation/bulb and with proximal ICA occlusion and no
detectable opacification of the downstream vessel until intracranial
reconstitution. Flow void was seen at this level on prior.

Left carotid system: Atheromatous wall thickening with mixed density
plaque at the bulb/bifurcation causing 50% stenosis.

Vertebral arteries: Subclavian atherosclerosis without flow limiting
stenosis. Codominant vertebral arteries. Extrinsic narrowing of the
vertebral arteries from uncovertebral spurs, especially on the
right. No intrinsic stenosis.

Skeleton: Generalized cervical spine degeneration. Generalized
osteopenia.

Other neck: Negative

Upper chest: Clear apical lungs

Review of the MIP images confirms the above findings

CTA HEAD FINDINGS

Anterior circulation: Right ICA reconstitution primarily at the
ophthalmic and supraclinoid segment. Symmetric enhancement of MCA
branches but hypoplastic right A1 segment and small right posterior
communicating artery. No downstream embolus is seen.

Posterior circulation: The vertebral and basilar arteries are
smoothly contoured and widely patent. Atheromatous plaque on the
vertebral arteries. No branch occlusion, beading, or aneurysm.

Venous sinuses: Negative

Anatomic variants: As above

Review of the MIP images confirms the above findings

Critical Value/emergent results were called by telephone at the time
of interpretation on [DATE] at [DATE] to provider Dr YOANA ,
who verbally acknowledged these results.
IMPRESSION: 1. Right proximal ICA occlusion, likely recent given the history.
There is intracranial reconstitution but nearly isolated right MCA
given hypoplastic right A1 segment and a small right posterior
communicating artery.
2. 50% atheromatous narrowing at the left ICA bulb.
3. Moderate right V2 segment narrowing due to extrinsic stenosis
from endplate spur.

## 2021-06-13 IMAGING — MR MR MRA NECK WO/W CM
17 of 23 series · 36 of 48 positions shown · IV contrast (gadavist)
Comparison: Head and neck CTA [DATE]

CLINICAL DATA: Stroke, follow-up.  Right-sided vision loss.

EXAM:
MRI HEAD WITHOUT CONTRAST
MRA HEAD WITHOUT CONTRAST
MRA OF THE NECK WITHOUT AND WITH CONTRAST
TECHNIQUE: Multiplanar, multi-echo pulse sequences of the brain and surrounding
structures were acquired without intravenous contrast. Angiographic
images of the Circle of Willis were acquired using MRA technique
without intravenous contrast. Angiographic images of the neck were
acquired using MRA technique without and with intravenous contrast.
Carotid stenosis measurements (when applicable) are obtained
utilizing NASCET criteria, using the distal internal carotid
diameter as the denominator.
CONTRAST:  10mL GADAVIST GADOBUTROL 1 MMOL/ML IV SOLN

[Series 5: DWI · axial · 3.0mm · 0.88mm/px · z∈[-132,+21]mm · 2 of 104 slices shown (1 of 4)]
[im 1/104]
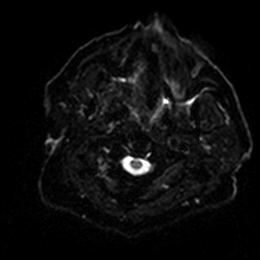
[im 104/104]
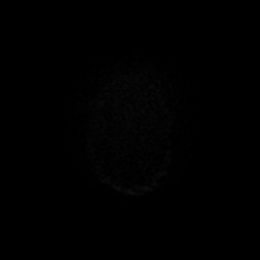

[Series 6: DWI · axial · 3.0mm · 0.88mm/px · 1 of 52 slices shown (2 of 4)]
[im 1/52]
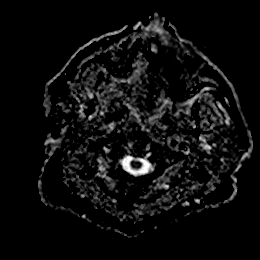

[Series 7: DWI · coronal · 4.0mm · 0.88mm/px · 2 of 72 slices shown (3 of 4)]
[im 1/72]
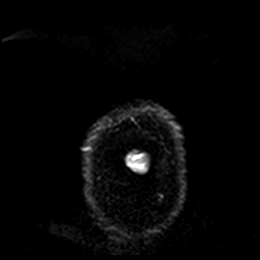
[im 72/72]
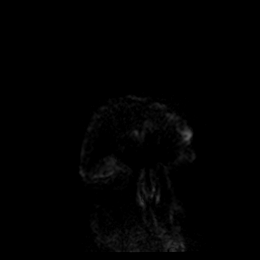

[Series 8: DWI · coronal · 4.0mm · 0.88mm/px · 1 of 36 slices shown (4 of 4)]
[im 1/36]
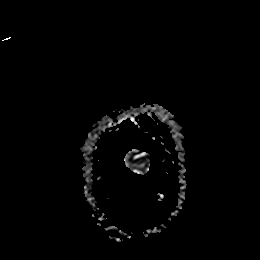

[Series 9: T1 · sagittal · 5.0mm · 0.75mm/px · 1 of 23 slices shown]
[im 1/23]
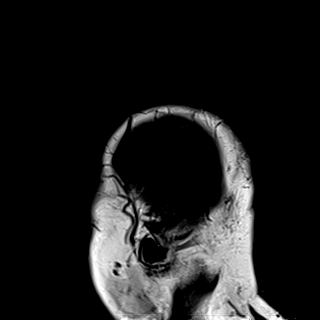

[Series 14: T2 · axial · 5.0mm · 0.72mm/px · 1 of 27 slices shown (1 of 2)]
[im 1/27]
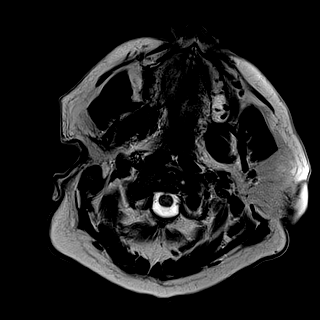

[Series 15: FLAIR · axial · 5.0mm · 0.45mm/px · 1 of 27 slices shown]
[im 1/27]
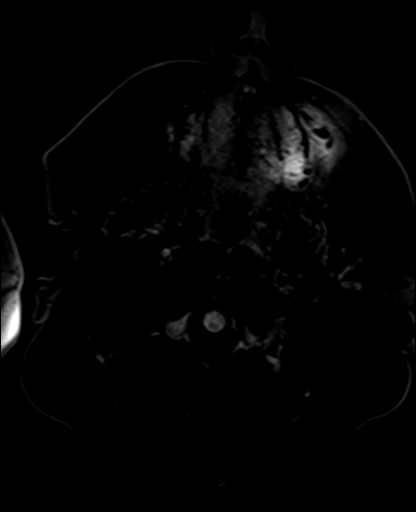

[Series 16: mag_images · axial · 3.0mm · 0.90mm/px · z∈[-141,+36]mm · 2 of 60 slices shown]
[im 1/60]
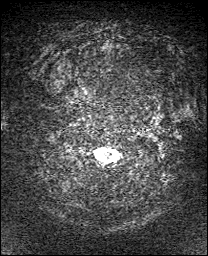
[im 60/60]
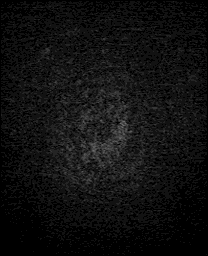

[Series 17: pha_images · axial · 3.0mm · 0.90mm/px · z∈[-141,+33]mm · 2 of 56 slices shown]
[im 1/56]
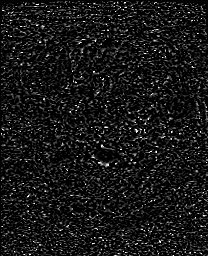
[im 56/56]
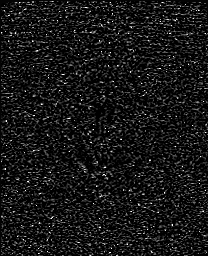

[Series 18: swi_images · axial · 3.0mm · 0.90mm/px · z∈[-141,+36]mm · 2 of 60 slices shown]
[im 1/60]
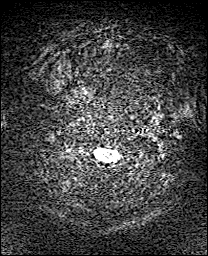
[im 60/60]
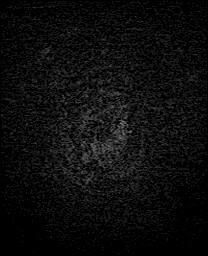

[Series 19: mip_images(sw) · axial · 24.0mm · 0.90mm/px · z∈[-130,+25]mm · 2 of 53 slices shown]
[im 1/53]
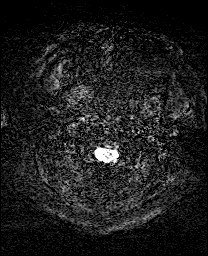
[im 53/53]
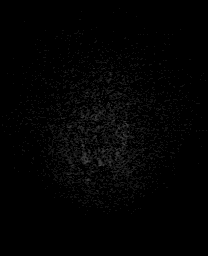

[Series 21: T2 · coronal · 5.0mm · 0.34mm/px · 1 of 29 slices shown (2 of 2)]
[im 1/29]
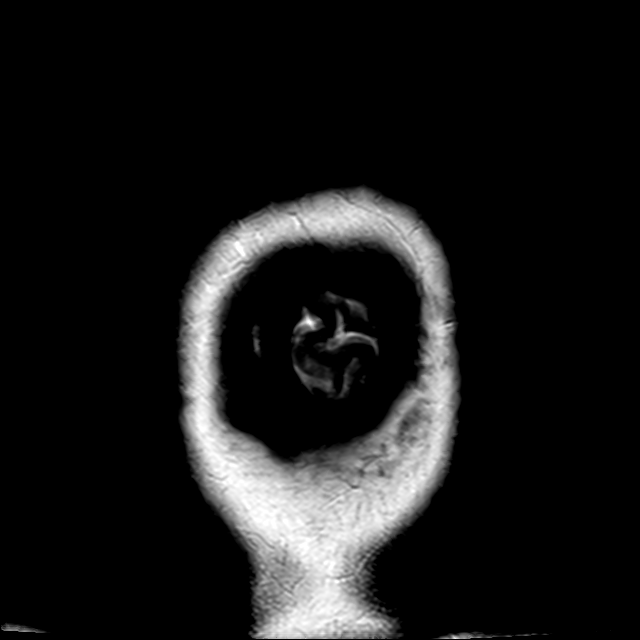

[Series 29: tof_fl3d_tra_iso · axial · 0.6mm · 0.52mm/px · z∈[-242,-126]mm · 6 of 195 slices shown]
[im 1/195]
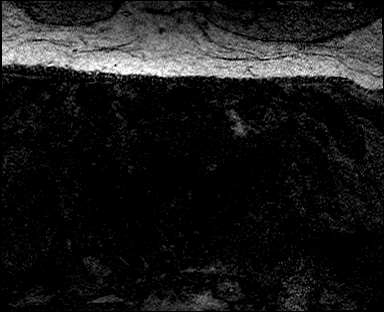
[im 39/195]
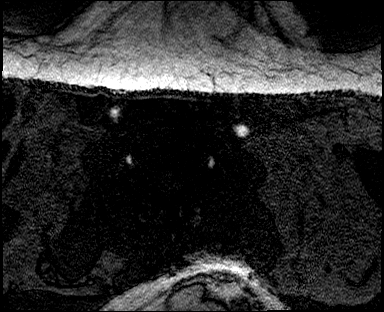
[im 78/195]
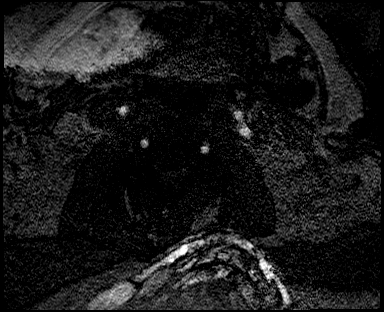
[im 117/195]
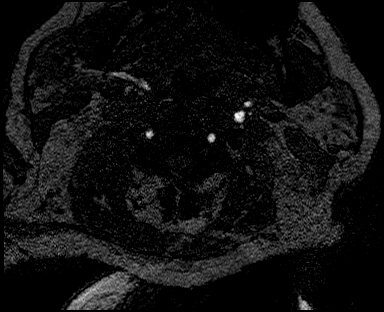
[im 156/195]
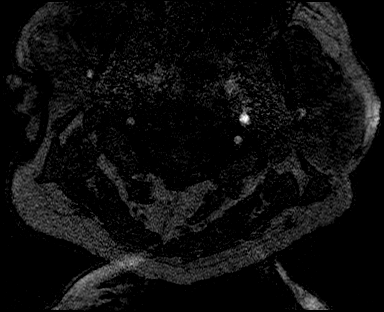
[im 195/195]
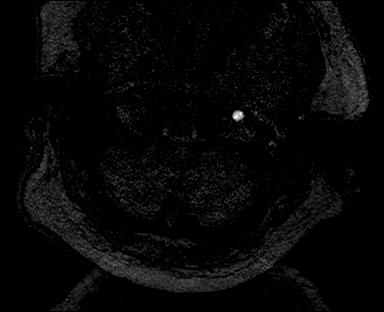

[Series 32: angio_fl3d_cor_pre_ttc=3.0s · coronal · 0.9mm · 0.85mm/px · 3 of 88 slices shown (1 of 2)]
[im 1/88]
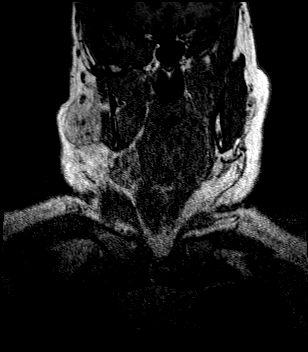
[im 44/88]
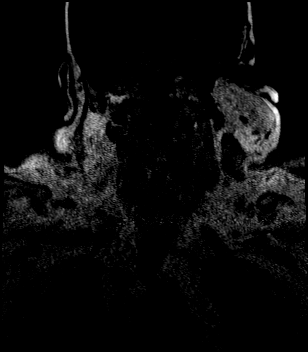
[im 88/88]
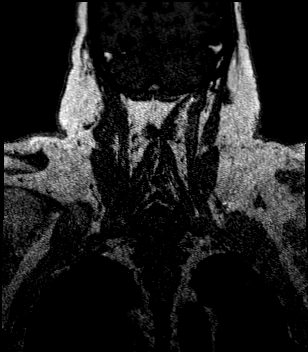

[Series 33: angio_fl3d_cor_pre_ttc=3.0s · coronal · 0.9mm · 0.85mm/px · 3 of 88 slices shown (2 of 2)]
[im 1/88]
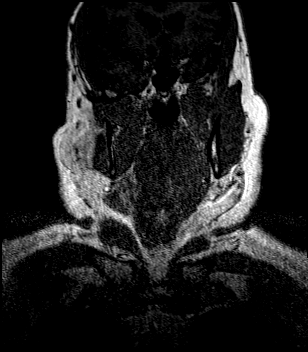
[im 44/88]
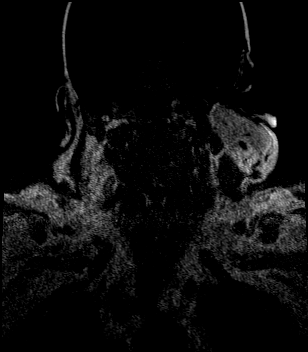
[im 88/88]
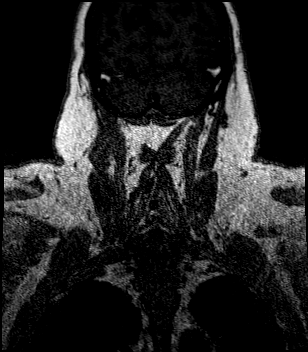

[Series 35: angio_fl3d_cor_post_ttc=3.0s · coronal · 0.9mm · 0.85mm/px · 3 of 84 slices shown (1 of 2)]
[im 1/84]
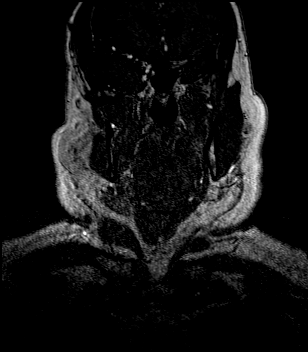
[im 42/84]
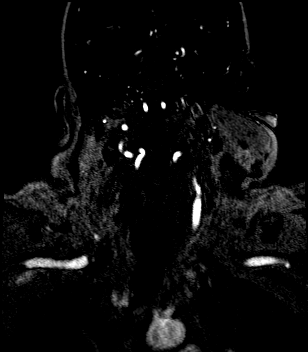
[im 84/84]
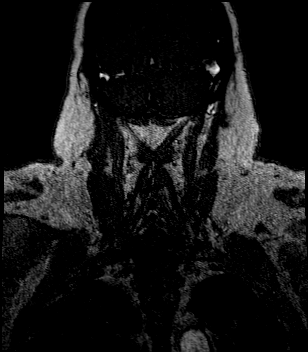

[Series 36: angio_fl3d_cor_post_ttc=3.0s · coronal · 0.9mm · 0.85mm/px · 3 of 88 slices shown (2 of 2)]
[im 1/88]
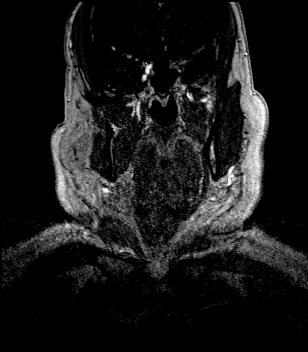
[im 44/88]
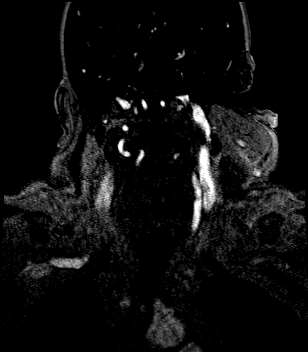
[im 88/88]
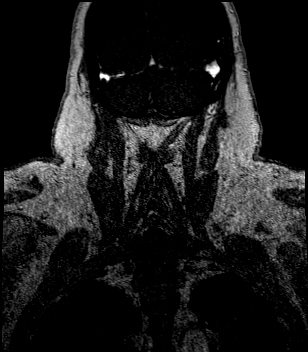

[36 of 48 positions shown; findings below may reference images not displayed]

FINDINGS: MR HEAD FINDINGS

Some sequences are mildly to moderately motion degraded.

Brain: There is a small acute infarct involving the right caudate
body, and there is question of a punctate acute subcortical white
matter infarct in the posterior right frontal lobe. T2
hyperintensities in the cerebral white matter nonspecific but
compatible with minimal chronic small vessel ischemic disease, not
advanced for age. Mild cerebral atrophy is within normal limits for
age. There are chronic lacunar infarcts in the left lentiform
nucleus and left cerebellar hemisphere.

Vascular: Known right ICA occlusion.

Skull and upper cervical spine: Unremarkable bone marrow signal.

Sinuses/Orbits: Unremarkable orbits. Paranasal sinuses and mastoid
air cells are clear.

Other: None.

MRA HEAD FINDINGS

Anterior circulation: The intracranial right ICA is occluded
proximally with reconstitution of the supraclinoid segment and
terminus. The intracranial left ICA is widely patent. ACAs and MCAs
are patent without evidence of a proximal branch occlusion or
significant M1 or left A1 stenosis. The right A1 segment is
hypoplastic. No aneurysm is identified.

Posterior circulation: The intracranial vertebral arteries are
widely patent to the basilar. The basilar artery is widely patent.
There is a patent right posterior communicating artery. Both PCAs
are patent without evidence of a significant proximal stenosis. No
aneurysm is identified.

Anatomic variants:None.

MRA NECK FINDINGS

Aortic arch: Common origin of the brachiocephalic and left common
carotid arteries. Widely patent arch vessel origins. Signal loss in
the proximal right subclavian artery, likely artifactual due to
vessel tortuosity and motion.

Right carotid system: Patent common carotid artery. Unchanged
proximal occlusion of the ICA without reconstitution in the neck.

Left carotid system: Signal loss in the proximal common carotid
artery, likely artifactual due to motion given normal appearance on
the earlier CTA. Wide patency of the more distal common carotid
artery. 50% stenosis of the ICA origin as shown on CTA.

Vertebral arteries: The vertebral arteries are patent and codominant
with antegrade flow bilaterally and without evidence of a
significant stenosis on the left. There is moderate proximal right
V2 stenosis which is due to extrinsic compression from uncovertebral
spurring based on CTA.
IMPRESSION: 1. Small acute infarct in the right caudate body.
2. Questionable punctate acute white matter infarct in the posterior
right frontal lobe.
3. Chronic lacunar infarcts in the left basal ganglia and
cerebellum.
4. Unchanged proximal right ICA occlusion with intracranial
reconstitution.
5. Unchanged 50% stenosis of the left ICA origin.

## 2021-06-13 MED ORDER — TRAZODONE HCL 50 MG PO TABS
50.0000 mg | ORAL_TABLET | Freq: Every evening | ORAL | Status: DC | PRN
  Administered 2021-06-18: 50 mg via ORAL
  Filled 2021-06-13: qty 1

## 2021-06-13 MED ORDER — HEPARIN SODIUM (PORCINE) 5000 UNIT/ML IJ SOLN
5000.0000 [IU] | Freq: Three times a day (TID) | INTRAMUSCULAR | Status: DC
  Administered 2021-06-13 – 2021-06-16 (×10): 5000 [IU] via SUBCUTANEOUS
  Filled 2021-06-13 (×11): qty 1

## 2021-06-13 MED ORDER — ACETAMINOPHEN 325 MG PO TABS: 650.0000 mg | ORAL_TABLET | Freq: Four times a day (QID) | ORAL | Status: DC | PRN

## 2021-06-13 MED ORDER — LOSARTAN POTASSIUM 50 MG PO TABS
50.0000 mg | ORAL_TABLET | Freq: Every day | ORAL | Status: DC
  Administered 2021-06-14: 50 mg via ORAL
  Filled 2021-06-13: qty 1

## 2021-06-13 MED ORDER — ATORVASTATIN CALCIUM 40 MG PO TABS: 40.0000 mg | ORAL_TABLET | Freq: Once | ORAL | Status: DC

## 2021-06-13 MED ORDER — POLYETHYLENE GLYCOL 3350 17 G PO PACK
17.0000 g | PACK | Freq: Every day | ORAL | Status: DC | PRN
  Administered 2021-06-16: 13:00:00 17 g via ORAL
  Filled 2021-06-13: qty 1

## 2021-06-13 MED ORDER — ATORVASTATIN CALCIUM 40 MG PO TABS: 40.0000 mg | ORAL_TABLET | Freq: Every day | ORAL | Status: DC

## 2021-06-13 MED ORDER — VITAMIN D 25 MCG (1000 UNIT) PO TABS
2000.0000 [IU] | ORAL_TABLET | Freq: Every day | ORAL | Status: DC
  Administered 2021-06-14 – 2021-06-19 (×6): 2000 [IU] via ORAL
  Filled 2021-06-13 (×6): qty 2

## 2021-06-13 MED ORDER — GADOBUTROL 1 MMOL/ML IV SOLN
10.0000 mL | Freq: Once | INTRAVENOUS | Status: AC | PRN
  Administered 2021-06-13: 10 mL via INTRAVENOUS

## 2021-06-13 MED ORDER — CLOPIDOGREL BISULFATE 75 MG PO TABS
75.0000 mg | ORAL_TABLET | Freq: Every day | ORAL | Status: DC
  Administered 2021-06-14: 75 mg via ORAL
  Filled 2021-06-13: qty 1

## 2021-06-13 MED ORDER — SODIUM CHLORIDE 0.9% FLUSH
3.0000 mL | Freq: Two times a day (BID) | INTRAVENOUS | Status: DC
  Administered 2021-06-13 – 2021-06-19 (×11): 3 mL via INTRAVENOUS

## 2021-06-13 MED ORDER — ACEBUTOLOL HCL 200 MG PO CAPS
200.0000 mg | ORAL_CAPSULE | Freq: Two times a day (BID) | ORAL | Status: DC
  Administered 2021-06-13 – 2021-06-19 (×11): 200 mg via ORAL
  Filled 2021-06-13 (×17): qty 1

## 2021-06-13 MED ORDER — SODIUM CHLORIDE 0.9% FLUSH: 3.0000 mL | INTRAVENOUS | Status: DC | PRN

## 2021-06-13 MED ORDER — ONDANSETRON HCL 4 MG PO TABS: 4.0000 mg | ORAL_TABLET | Freq: Four times a day (QID) | ORAL | Status: DC | PRN

## 2021-06-13 MED ORDER — ATORVASTATIN CALCIUM 40 MG PO TABS
40.0000 mg | ORAL_TABLET | Freq: Once | ORAL | Status: AC
  Administered 2021-06-13: 40 mg via ORAL
  Filled 2021-06-13: qty 1

## 2021-06-13 MED ORDER — ASPIRIN EC 81 MG PO TBEC
81.0000 mg | DELAYED_RELEASE_TABLET | Freq: Every day | ORAL | Status: DC
  Administered 2021-06-14 – 2021-06-17 (×4): 81 mg via ORAL
  Filled 2021-06-13 (×4): qty 1

## 2021-06-13 MED ORDER — LORAZEPAM 2 MG/ML IJ SOLN
1.0000 mg | Freq: Two times a day (BID) | INTRAMUSCULAR | Status: DC | PRN
  Administered 2021-06-13: 1 mg via INTRAVENOUS
  Filled 2021-06-13: qty 1

## 2021-06-13 MED ORDER — PANTOPRAZOLE SODIUM 40 MG PO TBEC
40.0000 mg | DELAYED_RELEASE_TABLET | Freq: Every day | ORAL | Status: DC
  Administered 2021-06-14 – 2021-06-19 (×6): 40 mg via ORAL
  Filled 2021-06-13 (×6): qty 1

## 2021-06-13 MED ORDER — ASPIRIN 325 MG PO TABS
325.0000 mg | ORAL_TABLET | Freq: Once | ORAL | Status: AC
  Administered 2021-06-13: 325 mg via ORAL
  Filled 2021-06-13: qty 1

## 2021-06-13 MED ORDER — BISACODYL 10 MG RE SUPP: 10.0000 mg | Freq: Every day | RECTAL | Status: DC | PRN

## 2021-06-13 MED ORDER — CLOPIDOGREL BISULFATE 75 MG PO TABS
300.0000 mg | ORAL_TABLET | Freq: Once | ORAL | Status: AC
  Administered 2021-06-13: 300 mg via ORAL
  Filled 2021-06-13: qty 4

## 2021-06-13 MED ORDER — ONDANSETRON HCL 4 MG/2ML IJ SOLN: 4.0000 mg | Freq: Four times a day (QID) | INTRAMUSCULAR | Status: DC | PRN

## 2021-06-13 MED ORDER — SODIUM CHLORIDE 0.9 % IV SOLN: 250.0000 mL | INTRAVENOUS | Status: DC | PRN

## 2021-06-13 MED ORDER — ATORVASTATIN CALCIUM 80 MG PO TABS
80.0000 mg | ORAL_TABLET | Freq: Every day | ORAL | Status: DC
  Administered 2021-06-14 – 2021-06-19 (×6): 80 mg via ORAL
  Filled 2021-06-13 (×6): qty 1

## 2021-06-13 MED ORDER — HYDRALAZINE HCL 20 MG/ML IJ SOLN: 10.0000 mg | Freq: Four times a day (QID) | INTRAMUSCULAR | Status: DC | PRN

## 2021-06-13 MED ORDER — SODIUM CHLORIDE 0.9% FLUSH
3.0000 mL | Freq: Two times a day (BID) | INTRAVENOUS | Status: DC
  Administered 2021-06-14 – 2021-06-17 (×4): 3 mL via INTRAVENOUS

## 2021-06-13 MED ORDER — IOHEXOL 350 MG/ML SOLN
100.0000 mL | Freq: Once | INTRAVENOUS | Status: AC | PRN
  Administered 2021-06-13: 75 mL via INTRAVENOUS

## 2021-06-13 MED ORDER — ACETAMINOPHEN 650 MG RE SUPP: 650.0000 mg | Freq: Four times a day (QID) | RECTAL | Status: DC | PRN

## 2021-06-13 NOTE — Progress Notes (Signed)
Patient transported to MRI.  IV Ativan given prior to patient leaving the unit

## 2021-06-13 NOTE — ED Provider Notes (Signed)
7:40 AM-checkout from Dr. Pilar Plate for follow-up on imaging.  He presented for evaluation of near total vision loss from the right eye.  He noticed this when he awoke this morning.  This was around 4:30 AM.  Recently he saw his cardiologist who ordered a outpatient Doppler of the carotid arteries because of some neurologic symptoms and prior left-sided vascular abnormality.  Also, he was recently taken off aspirin which he has been taking every other day.  CTA neck findings: IMPRESSION:  1. 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  There is intracranial reconstitution but nearly isolated right MCA  given hypoplastic right A1 segment and a small right posterior  communicating artery.  2. 50% atheromatous narrowing at the left ICA bulb.  3. Moderate right V2 segment narrowing due to extrinsic stenosis  from endplate spur.   9:15 AM-Case discussed and CT images reviewed with Dr. Iver Nestle, on-call neurologist.  She agrees with suggestion for transferring patient to [facility] for further management and stabilization.  She states he has not been candidate for intervention at this time.  9:18 AM-Consult complete with hospitalist. Patient case explained and discussed. He agrees to admit patient for further evaluation and treatment. Call ended at 9:30 AM   Diagnoses that have been ruled out:  None  Diagnoses that are still under consideration:  None  Final diagnoses:  Vision loss of right eye  Cerebrovascular accident (CVA), unspecified mechanism (HCC)  Carotid occlusion, right       Mancel Bale, MD 06/15/21 [id number]

## 2021-06-13 NOTE — Consult Note (Signed)
Neurology Consultation  Reason for Consult: Right CRAO, Right ICA occlusion Referring Physician: Dr. Denton Brick  CC: Right eye visual loss  History is obtained from: Patient, Chart review  HPI: Stephen Hull is a 75 y.o. male with a medical history significant for paroxysmal supraventricular tachycardia, essential hypertension, hyperlipidemia, remote tobacco use, chronic asymptomatic bradycardia, history of left carotid artery stenosis, and obesity with a BMI of 35.57 kg/m who presented to [facility] ED 11/24 for evaluation of right eye visual loss.  Patient was in his usual state of health last night at 21:30 when he went to bed but when he woke up at 04:30 this morning, he noticed he was unable to see out of his right eye.  Patient states for the past month he has had intermittent right eye inferior visual field loss 1-2 times a week that last for few seconds at a time before resolving.  He also has noted intermittent left forearm and finger numbness intermittently over the past month and left face numbness that has been fairly persistent for 1 month.  CT imaging was obtained while at [place] revealing a right proximal ICA occlusion and 50% stenosis of the left ICA bulb.  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  LKW: One month prior to hospital arrival TNK given?: no, patient has outside of the thrombolytic therapy window on presentation IR Thrombectomy? No, patients last known well is greater than 1 month ago Modified Rankin Scale: 0-Completely asymptomatic and back to baseline post- stroke  ROS: A complete ROS was performed and is negative except as noted in the HPI.   Past Medical History:  Diagnosis Date   Bradycardia    History of tobacco use    quit in 1991   Hypertension    Paroxysmal SVT (supraventricular tachycardia) (HCC)    Past Surgical History:   Procedure Laterality Date   COLONOSCOPY     COLONOSCOPY N/A 11/14/2016   Procedure: COLONOSCOPY;  Surgeon: Daneil Dolin, MD;  Location: [facility] ENDO [facility];  Service: Endoscopy;  Laterality: N/A;  66    TONSILLECTOMY     as a kid   Family History  Problem Relation Age of Onset   Cancer Mother        Stomach   Cancer - Prostate Father    Heart attack Father    Colon cancer Neg Hx    Social History:   reports that he quit smoking about 31 years ago. His smoking use included cigarettes. He quit smokeless tobacco use about 31 years ago. He reports that he does not drink alcohol and does not use drugs.  Medications  Current Facility-Administered Medications:    0.9 %  sodium chloride infusion, 250 mL, Intravenous, PRN, Emokpae, Courage, MD   acebutolol (SECTRAL) capsule 200 mg, 200 mg, Oral, BID, Emokpae, Courage, MD   acetaminophen (TYLENOL) tablet 650 mg, 650 mg, Oral, Q6H PRN **OR** acetaminophen (TYLENOL) suppository 650 mg, 650 mg, Rectal, Q6H PRN, Denton Brick, Courage, MD   [START ON 06/14/2021] aspirin EC tablet 81 mg, 81 mg, Oral, Q breakfast, Emokpae, Courage, MD   atorvastatin (LIPITOR) tablet 40 mg, 40 mg, Oral, Once, Emokpae, Courage, MD   [START ON 06/14/2021] atorvastatin (LIPITOR) tablet 80 mg, 80 mg, Oral, Daily, Emokpae, Courage, MD   bisacodyl (DULCOLAX) suppository 10 mg, 10 mg, Rectal, Daily PRN, Denton Brick, Courage, MD   cholecalciferol (VITAMIN D3) tablet  2,000 Units, 2,000 Units, Oral, Daily, Mariea Clonts, Courage, MD   [START ON 06/14/2021] clopidogrel (PLAVIX) tablet 75 mg, 75 mg, Oral, Daily, Emokpae, Courage, MD   heparin injection 5,000 Units, 5,000 Units, Subcutaneous, Q8H, Emokpae, Courage, MD   hydrALAZINE (APRESOLINE) injection 10 mg, 10 mg, Intravenous, Q6H PRN, Emokpae, Courage, MD   LORazepam (ATIVAN) injection 1 mg, 1 mg, Intravenous, Q12H PRN, Mariea Clonts, Courage, MD   losartan (COZAAR) tablet 50 mg, 50 mg, Oral, Daily, Emokpae, Courage, MD   ondansetron (ZOFRAN)  tablet 4 mg, 4 mg, Oral, Q6H PRN **OR** ondansetron (ZOFRAN) injection 4 mg, 4 mg, Intravenous, Q6H PRN, Emokpae, Courage, MD   pantoprazole (PROTONIX) EC tablet 40 mg, 40 mg, Oral, Daily, Emokpae, Courage, MD   polyethylene glycol (MIRALAX / GLYCOLAX) packet 17 g, 17 g, Oral, Daily PRN, Emokpae, Courage, MD   sodium chloride flush (NS) 0.9 % injection 3 mL, 3 mL, Intravenous, Q12H, Emokpae, Courage, MD   sodium chloride flush (NS) 0.9 % injection 3 mL, 3 mL, Intravenous, Q12H, Emokpae, Courage, MD   sodium chloride flush (NS) 0.9 % injection 3 mL, 3 mL, Intravenous, PRN, Mariea Clonts, Courage, MD   traZODone (DESYREL) tablet 50 mg, 50 mg, Oral, QHS PRN, Mariea Clonts, Courage, MD  Exam: Current vital signs: BP (!) 162/88 (BP Location: Left Arm)   Pulse 74   Temp 98 F (36.7 C) (Oral)   Resp 18   Ht 5\' 11"  (1.803 m)   Wt 115.7 kg   SpO2 96%   BMI 35.57 kg/m  Vital signs in last 24 hours: Temp:  [98 F (36.7 C)] 98 F (36.7 C) (11/24 1431) Pulse Rate:  [72-81] 74 (11/24 1431) Resp:  [14-23] 18 (11/24 1431) BP: (146-173)/(72-104) 162/88 (11/24 1431) SpO2:  [95 %-98 %] 96 % (11/24 1431) Weight:  [115.7 kg] 115.7 kg (11/24 0647)  GENERAL: Awake, alert, in no acute distress Psych: Affect appropriate for situation, patient is calm and cooperative with examination Head: Normocephalic and atraumatic, without obvious abnormality EENT: Normal conjunctivae, dry mucous membranes, no OP obstruction, wears eyeglasses LUNGS: Normal respiratory effort. Non-labored breathing on room air CV: Regular rate and rhythm on telemetry ABDOMEN: Soft, non-tender, non-distended Extremities: warm, well perfused, without obvious deformity  NEURO:  Mental Status: Awake, alert, and oriented to person, place, time, and situation. He is able to provide a clear and coherent history of present illness. Speech/Language: speech is intact without dysarthria. Naming, repetition, fluency, and comprehension intact without  aphasia. No neglect is noted Cranial Nerves:  II: Left pupil is 4 mm, round and briskly reactive to light.  Right pupil is 4 mm with RAPD.  Visual fields are full in the left eye, patient is only able to detect light in the right eye. III, IV, VI: EOMI without ptosis, nystagmus, gaze preference. V: Sensation is intact to light touch with reported numbness to the left face VII: Face is symmetric resting and smiling.  VIII: Hearing is intact to voice IX, X: Palate elevation is symmetric. Phonation normal.  XI: Normal sternocleidomastoid and trapezius muscle strength XII: Tongue protrudes midline without fasciculations.   Motor: 5/5 strength is all muscle groups without vertical drift. Tone is normal. Bulk is normal.  Sensation: Intact to light touch bilaterally in all four extremities. No extinction to DSS present.  Coordination: FTN intact bilaterally. HKS intact bilaterally. No pronator drift.  DTRs: 2+ and symmetric patellae and biceps bilaterally.  Gait: Deferred  NIHSS: 1a Level of Conscious.: 0 1b LOC Questions: 0 1c LOC Commands:  0 2 Best Gaze: 0 3 Visual: 2 4 Facial Palsy: 0 5a Motor Arm - left: 0 5b Motor Arm - Right: 0 6a Motor Leg - Left: 0 6b Motor Leg - Right: 0 7 Limb Ataxia: 0 8 Sensory: 1 9 Best Language: 0 10 Dysarthria: 0 11 Extinct. and Inatten.: 0 TOTAL: 3  Labs I have reviewed labs in epic and the results pertinent to this consultation are: CBC    Component Value Date/Time   WBC 11.6 (H) 06/13/2021 0725   RBC 4.95 06/13/2021 0725   HGB 16.0 06/13/2021 0731   HCT 47.0 06/13/2021 0731   PLT 192 06/13/2021 0725   MCV 92.7 06/13/2021 0725   MCH 30.3 06/13/2021 0725   MCHC 32.7 06/13/2021 0725   RDW 14.3 06/13/2021 0725   LYMPHSABS 2.0 06/13/2021 0725   MONOABS 1.0 06/13/2021 0725   EOSABS 0.5 06/13/2021 0725   BASOSABS 0.1 06/13/2021 0725   CMP     Component Value Date/Time   NA 142 06/13/2021 0731   K 4.1 06/13/2021 0731   CL 104 06/13/2021  0731   CO2 25 06/13/2021 0725   GLUCOSE 105 (H) 06/13/2021 0731   BUN 23 06/13/2021 0731   CREATININE 1.20 06/13/2021 0731   CALCIUM 8.7 (L) 06/13/2021 0725   PROT 6.7 06/13/2021 0725   ALBUMIN 3.8 06/13/2021 0725   AST 23 06/13/2021 0725   ALT 27 06/13/2021 0725   ALKPHOS 61 06/13/2021 0725   BILITOT 1.0 06/13/2021 0725   GFRNONAA >60 06/13/2021 0725   Lipid Panel  No results found for: CHOL, TRIG, HDL, CHOLHDL, VLDL, LDLCALC, LDLDIRECT No results found for: HGBA1C  Imaging I have reviewed the images obtained:  CT angio head and neck 11/24: 1. 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 There is intracranial reconstitution but nearly isolated right MCA given hypoplastic right A1 segment and a small right posterior communicating artery. 2. 50% atheromatous narrowing at the left ICA bulb. 3. Moderate right V2 segment narrowing due to extrinsic stenosis from endplate spur.   MRI Brain without contrast: R caudate infarct.  Assessment: 75 year old male who presented to [facility] ED 11/24 for evaluation of acute right sided vision loss with reports of 1 month of left facial numbness, intermittent left forearm and hand numbness.  Imaging revealed a right ICA occlusion with a right CRAO and patient was transferred to [facility] for further evaluation. - Examination reveals patient with right eye visual loss; patient is only able to detect light in the right eye and left facial numbness. - CT imaging as above. - Patient stroke risk factors include advanced age, remote history of tobacco use, hypertension, hyperlipidemia, obesity. - Will complete full stroke work-up for further evaluation.  Recommendations: - MRI brain without contrast pending  - Echocardiogram complete, results pending - HgbA1c, fasting lipid panel - Frequent neuro checks - Prophylactic therapy-Antiplatelet med: Aspirin - dose 325mg  PO or 300mg  PR followed by ASA 81 mg PO daily in addition to clopidogrel  75 mg PO daily for 90 days. - Permissive hypertension, treat systolic blood pressure > 220 mmHg - Risk factor modification - Telemetry monitoring - PT consult, OT consult, Speech consult - Stroke team to follow  Pt seen by NP/Neuro and later by MD. Note/plan to be edited by MD as needed.  Stephen Hull, AGAC-NP [facility] Pager: [phone]   NEUROHOSPITALIST ADDENDUM Performed a face to face diagnostic evaluation.   I have reviewed the contents of history and physical exam  as documented by PA/ARNP/Resident and agree with above documentation.  I have discussed and formulated the above plan as documented. Edits to the note have been made as needed.  Impression/Key exam findings/Plan: intermittent R eye inferior vision deficit over last month and woke up today in AM with painless R eye vision loss. Has Right APD on exam with complete R mono-ocular vision loss which is most consistent with CRAO. 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  Agree with stroke workup as above. 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 Discussed with him the importance of coming to the ED immediately upon first signs/symptoms of stroke.  Stephen Simpers, MD [facility] [phone]   If 7pm to 7am, please call on call as listed on AMION.

## 2021-06-13 NOTE — ED Triage Notes (Signed)
Pt states he noticed complete vision loss in right eye about 0430 this morning. Denies hx of same. LKN was 2130 last night prior to going to bed.

## 2021-06-13 NOTE — Progress Notes (Signed)
*  PRELIMINARY RESULTS* Echocardiogram 2D Echocardiogram has been performed.  Carolyne Fiscal 06/13/2021, 12:59 PM

## 2021-06-13 NOTE — ED Provider Notes (Signed)
[facility] Emergency Department Provider Note MRN:  [id number]  Arrival date & time: 06/13/21     Chief Complaint   Loss of Vision   History of Present Illness   Stephen Hull is a 75 y.o. year-old male with a history of hypertension, paroxysmal SVT presenting to the ED with chief complaint of loss of vision.  Woke up this morning at 4:30 AM and noticed that he could not see out of his right eye.  Vision was normal at about 9:30 PM when he went to bed.  Denies any eye pain, no eye trauma, no headache, no trouble with speech, no trouble swallowing, no chest pain or shortness of breath, no abdominal pain, no neck or back pain, no numbness or weakness to the arms or legs.  Has been having some intermittent numbness to the right side of the face and right arm for the past several weeks, is scheduled for a Doppler of his carotid arteries soon.  He comments that he has had intermittent loss of vision of the inferior visual field of the right eye for the past 2 days.  Review of Systems  A complete 10 system review of systems was obtained and all systems are negative except as noted in the HPI and PMH.   Patient's Health History    Past Medical History:  Diagnosis Date   Bradycardia    History of tobacco use    quit in 1991   Hypertension    Paroxysmal SVT (supraventricular tachycardia) (HCC)     Past Surgical History:  Procedure Laterality Date   COLONOSCOPY     COLONOSCOPY N/A 11/14/2016   Procedure: COLONOSCOPY;  Surgeon: Corbin Ade, MD;  Location: [facility]O [facility];  Service: Endoscopy;  Laterality: N/A;  730    TONSILLECTOMY     as a kid    Family History  Problem Relation Age of Onset   Cancer Mother        Stomach   Cancer - Prostate Father    Heart attack Father    Colon cancer Neg Hx     Social History   Socioeconomic History   Marital status: Married    Spouse name: Not on file   Number of children: Not on file   Years of  education: Not on file   Highest education level: Not on file  Occupational History   Not on file  Tobacco Use   Smoking status: Former    Types: Cigarettes    Quit date: 07/21/1989    Years since quitting: 31.9   Smokeless tobacco: Former    Quit date: 07/21/1989  Vaping Use   Vaping Use: Never used  Substance and Sexual Activity   Alcohol use: No   Drug use: No   Sexual activity: Not on file  Other Topics Concern   Not on file  Social History Narrative   Not on file   Social Determinants of Health   Financial Resource Strain: Not on file  Food Insecurity: Not on file  Transportation Needs: Not on file  Physical Activity: Not on file  Stress: Not on file  Social Connections: Not on file  Intimate Partner Violence: Not on file     Physical Exam   Vitals:   06/13/21 0647  BP: (!) 171/104  Pulse: 80  Resp: 20  Temp: 98 F (36.7 C)  SpO2: 98%    CONSTITUTIONAL: Well-appearing, NAD NEURO:  Alert and oriented x 3, normal and symmetric strength  and sensation, normal coordination, normal speech EYES:  eyes equal and reactive, normal extraocular movements, no afferent pupillary defect; right eye visual acuity severely decreased, is able to distinguish light and movement ENT/NECK:  no LAD, no JVD CARDIO: Regular rate, well-perfused, normal S1 and S2 PULM:  CTAB no wheezing or rhonchi GI/GU:  normal bowel sounds, non-distended, non-tender MSK/SPINE:  No gross deformities, no edema SKIN:  no rash, atraumatic PSYCH:  Appropriate speech and behavior  *Additional and/or pertinent findings included in MDM below  Diagnostic and Interventional Summary    EKG Interpretation  Date/Time:    Ventricular Rate:    PR Interval:    QRS Duration:   QT Interval:    QTC Calculation:   R Axis:     Text Interpretation:         Labs Reviewed  RESP PANEL BY RT-PCR (FLU A&B, COVID) ARPGX2  ETHANOL  PROTIME-INR  APTT  CBC  DIFFERENTIAL  COMPREHENSIVE METABOLIC PANEL  RAPID  URINE DRUG SCREEN, HOSP PERFORMED  URINALYSIS, ROUTINE W REFLEX MICROSCOPIC  I-STAT CHEM 8, ED    CT Angio Head W or Wo Contrast    (Results Pending)  CT Angio Neck W and/or Wo Contrast    (Results Pending)    Medications - No data to display   Procedures  /  Critical Care .Critical Care Performed by: Sabas Sous, MD Authorized by: Sabas Sous, MD   Critical care provider statement:    Critical care time (minutes):  33   Critical care was necessary to treat or prevent imminent or life-threatening deterioration of the following conditions: Concern for acute ischemic stroke.   Critical care was time spent personally by me on the following activities:  Development of treatment plan with patient or surrogate, discussions with consultants, evaluation of patient's response to treatment, examination of patient, ordering and review of laboratory studies, ordering and review of radiographic studies, ordering and performing treatments and interventions, pulse oximetry, re-evaluation of patient's condition and review of old charts Ultrasound ED Ocular  Date/Time: 06/13/2021 7:22 AM Performed by: Sabas Sous, MD Authorized by: Sabas Sous, MD   PROCEDURE DETAILS:    Indications: visual change     Assessed:  Right eye   Right eye axial view: obtained     Right eye sagittal view: obtained     Images: archived     Limitations:  None RIGHT EYE FINDINGS:     no foreign body noted in right eye    right eye lens not dislodged    no right eye increased optic nerve sheath diameter    no retrobulbar hematoma in right eye    no evidence of retinal detachment of the right eye    no ruptured globe in right eye    no vitreous hemorrhage in right eye Comments:     No abnormalities noted.  ED Course and Medical Decision Making  I have reviewed the triage vital signs, the nursing notes, and pertinent available records from the EMR.  Listed above are laboratory and imaging tests that I  personally ordered, reviewed, and interpreted and then considered in my medical decision making (see below for details).  Differential diagnosis includes acute ischemic stroke versus retinal detachment.  Suspicious for stroke given patient's left arm and face symptoms recently and risk factors.  But also some suspicion for retinal detachment given the inferior visual field symptoms recently.  Bedside ultrasound is demonstrating a normal retina.  Neurological exam at this time  is normal with the exception of right eye visual acuity.  Awaiting CTA head and neck, labs.  Suspect will need MRI/admission.  Signed out to oncoming provider at shift change.       Elmer Sow. Pilar Plate, MD [facility] [email or web address]  Final Clinical Impressions(s) / ED Diagnoses     ICD-10-CM   1. Vision loss of right eye  H54.61       ED Discharge Orders     None        Discharge Instructions Discussed with and Provided to Patient:   Discharge Instructions   None       Sabas Sous, MD 06/13/21 [phone]

## 2021-06-13 NOTE — H&P (Addendum)
Patient Demographics:    [place], is a 75 y.o. male  MRN: [id number]   DOB - 1945/07/26  Admit Date - 06/13/2021  Outpatient Primary MD for the patient is Aletha Halim., PA-C   Assessment & Plan:    Principal Problem:   Acute CVA (cerebrovascular accident) [facility]) Active Problems:   ICAO (internal carotid artery occlusion), right with Rt Eye Vision Loss   Hypertension   PSVT (paroxysmal supraventricular tachycardia) (HCC)   Bradycardia   Carotid stenosis, left--- 50 %   1) acute stroke/right eye vision loss/Rt ICA occlusion and left ICA stenosis-- EDP discussed case with on-call neurologist Dr Quinn Axe who recommended transfer to [facility] for further neurology work-up and MRI -In ED ocular ultrasound was done by EDP see documentation in the EDP (Dr Wende Bushy)  notes -CTA head and neck with right proximal ICA occlusion as well as 50% atheromatous narrowing of the left ICA bulb -place on telemetry monitored unit, treat empirically with aspirin, Plavix Lipitor and  statin Rx, MRI of the brain pending, echocardiogram to rule out intracardiac thrombus  and to evaluate EF is pending,  -Official neurology consult pending at [facility].  PT/OT eval pending,   -. Use Hydralazine 10mg  o iv every 4 hrs as needed for systolic blood pressure over 180 mmhg , check TSH, A1c and fasting lipid profile,   2)Class II Obesity- -Low calorie diet, portion control and increase physical activity discussed with patient -Body mass index is 35.57 kg/m.  3)HTN--avoid over aggressive BP control due to #1 above, -Restart losartan --. Use Hydralazine 10mg  o iv every 4 hrs as needed for systolic blood pressure over 180 mmhg ,  4)H/o PSVT--- -EKG sinus rhythm with PVCs, no significant change from prior -Continue  acebutolol  Disposition/Need for in-Hospital Stay- patient unable to be discharged at this time due to acute stroke with right eye vision loss awaiting MRI brain, echo, official neurology consult and further stroke work-up*  Dispo: The patient is from: Home              Anticipated d/c is to: Home              Anticipated d/c date is: 1 day              Patient currently is not medically stable to d/c. Barriers: Not Clinically Stable-   With History of - Reviewed by me  Past Medical History:  Diagnosis Date   Bradycardia    History of tobacco use    quit in 1991   Hypertension    Paroxysmal SVT (supraventricular tachycardia) ([place])       Past Surgical History:  Procedure Laterality Date   COLONOSCOPY     COLONOSCOPY N/A 11/14/2016   Procedure: COLONOSCOPY;  Surgeon: Daneil Dolin, MD;  Location: [facility];  Service: Endoscopy;  Laterality: N/A;  730    TONSILLECTOMY     as a kid  Chief Complaint  Patient presents with   Loss of Vision      HPI:    Stephen Hull  is a 75 y.o. male who is a reformed smoker (quit in 1991) with past medical history relevant for HTN, HLD, PSVT as well as history of chronic asymptomatic bradycardia and history of left carotid artery stenosis presents to the ED with sudden onset of right eye vision loss and headache around 4:30 AM today -Last known well around 9:30 PM when he went to bed -He reports intermittent loss of vision of the inferior visual field of the right eye for the past 2 days -No trauma, no dizziness, no chest pains no palpitations no speech disturbance no swallowing concerns, no extremity weakness or numbness, no gait concerns -Lately patient has been having intermittent right facial and right upper extremity numbness and tingling and he was undergoing work-up at the [place] and with his PCP at [place] discussed case with on-call neurologist recommended transfer to [facility] for further  neurology work-up and MRI -In ED ocular ultrasound was done by EDP see documentation in the EDP (Dr Wende Bushy)  notes  -Additional history obtained from patient's daughter Stephen Hull at bedside  -CTA head and neck with right proximal ICA occlusion as well as 50% atheromatous narrowing of the left ICA bulb -Hgb is 15.0 WBC 11.6 with a platelet of 192 -Creatinine is 1.25, LFTs are not elevated -EKG sinus rhythm with PVCs no significant change from prior   Review of systems:    In addition to the HPI above,   A full Review of  Systems was done, all other systems reviewed are negative except as noted above in HPI , .    Social History:  Reviewed by me    Social History   Tobacco Use   Smoking status: Former    Types: Cigarettes    Quit date: 07/21/1989    Years since quitting: 31.9   Smokeless tobacco: Former    Quit date: 07/21/1989  Substance Use Topics   Alcohol use: No     Family History :  Reviewed by me    Family History  Problem Relation Age of Onset   Cancer Mother        Stomach   Cancer - Prostate Father    Heart attack Father    Colon cancer Neg Hx      Home Medications:   Prior to Admission medications   Medication Sig Start Date End Date Taking? Authorizing Provider  acebutolol (SECTRAL) 200 MG capsule Take 1 capsule by mouth 2 (two) times daily. 06/18/16   [provider]  atorvastatin (LIPITOR) 40 MG tablet Take 1 tablet (40 mg total) by mouth daily. 06/06/21 09/04/21  Darreld Mclean, PA-C  Cholecalciferol (VITAMIN D3) 2000 units TABS Take 2,000 Units by mouth daily.    [provider]  dextromethorphan-guaiFENesin (MUCINEX DM) 30-600 MG 12hr tablet Take 1 tablet by mouth daily as needed for cough.    [provider]  ibuprofen (ADVIL,MOTRIN) 200 MG tablet Take 400 mg by mouth every 6 (six) hours as needed for mild pain.    [provider]  losartan (COZAAR) 50 MG tablet Take 1 tablet by mouth daily. 06/18/16   [provider]  omeprazole (PRILOSEC) 40 MG capsule Take 1 capsule by mouth daily. 06/18/16   [provider]  triamcinolone ointment (KENALOG) 0.5 % Apply twice daily to areas for 1-2 weeks 01/27/20  [provider]     Allergies:     Allergies  Allergen Reactions   Cashew Nut (Anacardium Occidentale) Skin Test Other (See Comments)    Makes him sick Makes him sick      Physical Exam:   Vitals  Blood pressure (!) 149/91, pulse 76, temperature 98 F (36.7 C), resp. rate 15, height 5\' 11"  (1.803 m), weight 115.7 kg, SpO2 95 %.  Physical Examination: General appearance - alert, and in no distress  Mental status - alert, oriented to person, place, and time,  Eyes - sclera anicteric, right eye vision loss, EOMI intact Neck - supple, no JVD elevation, Chest - clear  to auscultation bilaterally, symmetrical air movement,  Heart - S1 and S2 normal, irregular, but not irregularly irregular Abdomen - soft, nontender, nondistended, increased truncal adiposity Extremities - no pedal edema noted, intact peripheral pulses  Skin - warm, dry -Neurologial Exam: Mental Status: Patient is awake, alert, oriented to person, place, month, year, and situation. Patient is able to give a clear and coherent history. No signs of aphasia or neglect Cranial Nerves: II: --Right eye vision loss,  III,IV, VI: EOMI without ptosis or diploplia.  V: Right-sided facial sensation concerns VII: Facial movement is symmetric.  VIII: hearing is intact to voice X: Uvula elevates symmetrically XI: Shoulder shrug is symmetric. XII: tongue is midline without atrophy or fasciculations.  Motor: Tone is normal. Bulk is normal. 5/5 strength was present in all four extremities.  Sensory: Sensation is normal to pinprick and to touch Cerebellar: FNF without ataxia    Data Review:    CBC Recent Labs  Lab 06/13/21 0725 06/13/21 0731  WBC 11.6*  --   HGB 15.0 16.0  HCT 45.9 47.0  PLT 192   --   MCV 92.7  --   MCH 30.3  --   MCHC 32.7  --   RDW 14.3  --   LYMPHSABS 2.0  --   MONOABS 1.0  --   EOSABS 0.5  --   BASOSABS 0.1  --    ------------------------------------------------------------------------------------------------------------------  Chemistries  Recent Labs  Lab 06/13/21 0725 06/13/21 0731  NA 139 142  K 3.9 4.1  CL 106 104  CO2 25  --   GLUCOSE 106* 105*  BUN 23 23  CREATININE 1.25* 1.20  CALCIUM 8.7*  --   AST 23  --   ALT 27  --   ALKPHOS 61  --   BILITOT 1.0  --    ------------------------------------------------------------------------------------------------------------------ estimated creatinine clearance is 71 mL/min (by C-G formula based on SCr of 1.2 mg/dL). ------------------------------------------------------------------------------------------------------------------ No results for input(s): TSH, T4TOTAL, T3FREE, THYROIDAB in the last 72 hours.  Invalid input(s): FREET3   Coagulation profile Recent Labs  Lab 06/13/21 0725  INR 1.0   ------------------------------------------------------------------------------------------------------------------- No results for input(s): DDIMER in the last 72 hours. -------------------------------------------------------------------------------------------------------------------  Cardiac Enzymes No results for input(s): CKMB, TROPONINI, MYOGLOBIN in the last 168 hours.  Invalid input(s): CK ------------------------------------------------------------------------------------------------------------------ No results found for: BNP   ---------------------------------------------------------------------------------------------------------------  Urinalysis No results found for: COLORURINE, APPEARANCEUR, LABSPEC, PHURINE, GLUCOSEU, HGBUR, BILIRUBINUR, KETONESUR, PROTEINUR, UROBILINOGEN, NITRITE,  LEUKOCYTESUR  ----------------------------------------------------------------------------------------------------------------   Imaging Results:    CT Angio Head W or Wo Contrast  Result Date: 06/13/2021 CLINICAL DATA:  Headache with right-sided vision loss. Numbness to the right side of face and arm for several weeks EXAM: CT ANGIOGRAPHY HEAD AND NECK TECHNIQUE: Multidetector CT imaging of the head and neck was performed using the standard protocol during bolus  administration of intravenous contrast. Multiplanar CT image reconstructions and MIPs were obtained to evaluate the vascular anatomy. Carotid stenosis measurements (when applicable) are obtained utilizing NASCET criteria, using the distal internal carotid diameter as the denominator. CONTRAST:  44mL OMNIPAQUE IOHEXOL 350 MG/ML SOLN COMPARISON:  Brain MRI 08/11/2016 FINDINGS: CT HEAD FINDINGS Brain: No evidence of acute infarction, hemorrhage, hydrocephalus, extra-axial collection or mass lesion/mass effect. Vascular: No hyperdense vessel or unexpected calcification. Skull: Normal. Negative for fracture or focal lesion. Sinuses: Imaged portions are clear. Orbits: No acute finding. Review of the MIP images confirms the above findings CTA NECK FINDINGS Aortic arch: Atheromatous plaque. Minimal coverage. Likely 2 vessel branching. Right carotid system: Irregular low-density plaque with ulceration at the bifurcation/bulb and with proximal ICA occlusion and no detectable opacification of the downstream vessel until intracranial reconstitution. Flow void was seen at this level on prior. Left carotid system: Atheromatous wall thickening with mixed density plaque at the bulb/bifurcation causing 50% stenosis. Vertebral arteries: Subclavian atherosclerosis without flow limiting stenosis. Codominant vertebral arteries. Extrinsic narrowing of the vertebral arteries from uncovertebral spurs, especially on the right. No intrinsic stenosis. Skeleton: Generalized  cervical spine degeneration. Generalized osteopenia. Other neck: Negative Upper chest: Clear apical lungs Review of the MIP images confirms the above findings CTA HEAD FINDINGS Anterior circulation: Right ICA reconstitution primarily at the ophthalmic and supraclinoid segment. Symmetric enhancement of MCA branches but hypoplastic right A1 segment and small right posterior communicating artery. No downstream embolus is seen. Posterior circulation: The vertebral and basilar arteries are smoothly contoured and widely patent. Atheromatous plaque on the vertebral arteries. No branch occlusion, beading, or aneurysm. Venous sinuses: Negative Anatomic variants: As above Review of the MIP images confirms the above findings Critical Value/emergent results were called by telephone at the time of interpretation on 06/13/2021 at 8:58 am to provider Dr Effie Shy , who verbally acknowledged these results. IMPRESSION: 1. 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 There is intracranial reconstitution but nearly isolated right MCA given hypoplastic right A1 segment and a small right posterior communicating artery. 2. 50% atheromatous narrowing at the left ICA bulb. 3. Moderate right V2 segment narrowing due to extrinsic stenosis from endplate spur. Electronically Signed   By: Tiburcio Pea M.D.   On: 06/13/2021 08:59   CT Angio Neck W and/or Wo Contrast  Result Date: 06/13/2021 CLINICAL DATA:  Headache with right-sided vision loss. Numbness to the right side of face and arm for several weeks EXAM: CT ANGIOGRAPHY HEAD AND NECK TECHNIQUE: Multidetector CT imaging of the head and neck was performed using the standard protocol during bolus administration of intravenous contrast. Multiplanar CT image reconstructions and MIPs were obtained to evaluate the vascular anatomy. Carotid stenosis measurements (when applicable) are obtained utilizing NASCET criteria, using the distal internal carotid diameter as the  denominator. CONTRAST:  52mL OMNIPAQUE IOHEXOL 350 MG/ML SOLN COMPARISON:  Brain MRI 08/11/2016 FINDINGS: CT HEAD FINDINGS Brain: No evidence of acute infarction, hemorrhage, hydrocephalus, extra-axial collection or mass lesion/mass effect. Vascular: No hyperdense vessel or unexpected calcification. Skull: Normal. Negative for fracture or focal lesion. Sinuses: Imaged portions are clear. Orbits: No acute finding. Review of the MIP images confirms the above findings CTA NECK FINDINGS Aortic arch: Atheromatous plaque. Minimal coverage. Likely 2 vessel branching. Right carotid system: Irregular low-density plaque with ulceration at the bifurcation/bulb and with proximal ICA occlusion and no detectable opacification of the downstream vessel until intracranial reconstitution. Flow void was seen at this level on prior. Left carotid system: Atheromatous wall  thickening with mixed density plaque at the bulb/bifurcation causing 50% stenosis. Vertebral arteries: Subclavian atherosclerosis without flow limiting stenosis. Codominant vertebral arteries. Extrinsic narrowing of the vertebral arteries from uncovertebral spurs, especially on the right. No intrinsic stenosis. Skeleton: Generalized cervical spine degeneration. Generalized osteopenia. Other neck: Negative Upper chest: Clear apical lungs Review of the MIP images confirms the above findings CTA HEAD FINDINGS Anterior circulation: Right ICA reconstitution primarily at the ophthalmic and supraclinoid segment. Symmetric enhancement of MCA branches but hypoplastic right A1 segment and small right posterior communicating artery. No downstream embolus is seen. Posterior circulation: The vertebral and basilar arteries are smoothly contoured and widely patent. Atheromatous plaque on the vertebral arteries. No branch occlusion, beading, or aneurysm. Venous sinuses: Negative Anatomic variants: As above Review of the MIP images confirms the above findings Critical Value/emergent  results were called by telephone at the time of interpretation on 06/13/2021 at 8:58 am to provider Dr Effie Shy , who verbally acknowledged these results. IMPRESSION: 1. 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 There is intracranial reconstitution but nearly isolated right MCA given hypoplastic right A1 segment and a small right posterior communicating artery. 2. 50% atheromatous narrowing at the left ICA bulb. 3. Moderate right V2 segment narrowing due to extrinsic stenosis from endplate spur. Electronically Signed   By: Tiburcio Pea M.D.   On: 06/13/2021 08:59    Radiological Exams on Admission: CT Angio Head W or Wo Contrast  Result Date: 06/13/2021 CLINICAL DATA:  Headache with right-sided vision loss. Numbness to the right side of face and arm for several weeks EXAM: CT ANGIOGRAPHY HEAD AND NECK TECHNIQUE: Multidetector CT imaging of the head and neck was performed using the standard protocol during bolus administration of intravenous contrast. Multiplanar CT image reconstructions and MIPs were obtained to evaluate the vascular anatomy. Carotid stenosis measurements (when applicable) are obtained utilizing NASCET criteria, using the distal internal carotid diameter as the denominator. CONTRAST:  22mL OMNIPAQUE IOHEXOL 350 MG/ML SOLN COMPARISON:  Brain MRI 08/11/2016 FINDINGS: CT HEAD FINDINGS Brain: No evidence of acute infarction, hemorrhage, hydrocephalus, extra-axial collection or mass lesion/mass effect. Vascular: No hyperdense vessel or unexpected calcification. Skull: Normal. Negative for fracture or focal lesion. Sinuses: Imaged portions are clear. Orbits: No acute finding. Review of the MIP images confirms the above findings CTA NECK FINDINGS Aortic arch: Atheromatous plaque. Minimal coverage. Likely 2 vessel branching. Right carotid system: Irregular low-density plaque with ulceration at the bifurcation/bulb and with proximal ICA occlusion and no detectable opacification of  the downstream vessel until intracranial reconstitution. Flow void was seen at this level on prior. Left carotid system: Atheromatous wall thickening with mixed density plaque at the bulb/bifurcation causing 50% stenosis. Vertebral arteries: Subclavian atherosclerosis without flow limiting stenosis. Codominant vertebral arteries. Extrinsic narrowing of the vertebral arteries from uncovertebral spurs, especially on the right. No intrinsic stenosis. Skeleton: Generalized cervical spine degeneration. Generalized osteopenia. Other neck: Negative Upper chest: Clear apical lungs Review of the MIP images confirms the above findings CTA HEAD FINDINGS Anterior circulation: Right ICA reconstitution primarily at the ophthalmic and supraclinoid segment. Symmetric enhancement of MCA branches but hypoplastic right A1 segment and small right posterior communicating artery. No downstream embolus is seen. Posterior circulation: The vertebral and basilar arteries are smoothly contoured and widely patent. Atheromatous plaque on the vertebral arteries. No branch occlusion, beading, or aneurysm. Venous sinuses: Negative Anatomic variants: As above Review of the MIP images confirms the above findings Critical Value/emergent results were called by telephone at the time  of interpretation on 06/13/2021 at 8:58 am to provider Dr Eulis Foster , who verbally acknowledged these results. IMPRESSION: 1. 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 There is intracranial reconstitution but nearly isolated right MCA given hypoplastic right A1 segment and a small right posterior communicating artery. 2. 50% atheromatous narrowing at the left ICA bulb. 3. Moderate right V2 segment narrowing due to extrinsic stenosis from endplate spur. Electronically Signed   By: Jorje Guild M.D.   On: 06/13/2021 08:59   CT Angio Neck W and/or Wo Contrast  Result Date: 06/13/2021 CLINICAL DATA:  Headache with right-sided vision loss. Numbness to  the right side of face and arm for several weeks EXAM: CT ANGIOGRAPHY HEAD AND NECK TECHNIQUE: Multidetector CT imaging of the head and neck was performed using the standard protocol during bolus administration of intravenous contrast. Multiplanar CT image reconstructions and MIPs were obtained to evaluate the vascular anatomy. Carotid stenosis measurements (when applicable) are obtained utilizing NASCET criteria, using the distal internal carotid diameter as the denominator. CONTRAST:  41mL OMNIPAQUE IOHEXOL 350 MG/ML SOLN COMPARISON:  Brain MRI 08/11/2016 FINDINGS: CT HEAD FINDINGS Brain: No evidence of acute infarction, hemorrhage, hydrocephalus, extra-axial collection or mass lesion/mass effect. Vascular: No hyperdense vessel or unexpected calcification. Skull: Normal. Negative for fracture or focal lesion. Sinuses: Imaged portions are clear. Orbits: No acute finding. Review of the MIP images confirms the above findings CTA NECK FINDINGS Aortic arch: Atheromatous plaque. Minimal coverage. Likely 2 vessel branching. Right carotid system: Irregular low-density plaque with ulceration at the bifurcation/bulb and with proximal ICA occlusion and no detectable opacification of the downstream vessel until intracranial reconstitution. Flow void was seen at this level on prior. Left carotid system: Atheromatous wall thickening with mixed density plaque at the bulb/bifurcation causing 50% stenosis. Vertebral arteries: Subclavian atherosclerosis without flow limiting stenosis. Codominant vertebral arteries. Extrinsic narrowing of the vertebral arteries from uncovertebral spurs, especially on the right. No intrinsic stenosis. Skeleton: Generalized cervical spine degeneration. Generalized osteopenia. Other neck: Negative Upper chest: Clear apical lungs Review of the MIP images confirms the above findings CTA HEAD FINDINGS Anterior circulation: Right ICA reconstitution primarily at the ophthalmic and supraclinoid segment.  Symmetric enhancement of MCA branches but hypoplastic right A1 segment and small right posterior communicating artery. No downstream embolus is seen. Posterior circulation: The vertebral and basilar arteries are smoothly contoured and widely patent. Atheromatous plaque on the vertebral arteries. No branch occlusion, beading, or aneurysm. Venous sinuses: Negative Anatomic variants: As above Review of the MIP images confirms the above findings Critical Value/emergent results were called by telephone at the time of interpretation on 06/13/2021 at 8:58 am to provider Dr Eulis Foster , who verbally acknowledged these results. IMPRESSION: 1. 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 There is intracranial reconstitution but nearly isolated right MCA given hypoplastic right A1 segment and a small right posterior communicating artery. 2. 50% atheromatous narrowing at the left ICA bulb. 3. Moderate right V2 segment narrowing due to extrinsic stenosis from endplate spur. Electronically Signed   By: Jorje Guild M.D.   On: 06/13/2021 08:59    DVT Prophylaxis -SCD /heparin AM Labs Ordered, also please review Full Orders  Family Communication: Admission, patients condition and plan of care including tests being ordered have been discussed with the patient and daughter Stephen Hull at bedside who indicate understanding and agree with the plan   Code Status - Full Code  Likely DC to home in 1 to 2 days after stroke work-up  Condition   stable  Roxan Hockey M.D on 06/13/2021 at 11:10 AM Go to [email or web address] -  for contact info  [facility] - Office  [phone]

## 2021-06-14 ENCOUNTER — Encounter (HOSPITAL_COMMUNITY): Payer: Self-pay | Admitting: Family Medicine

## 2021-06-14 ENCOUNTER — Observation Stay (HOSPITAL_COMMUNITY): Payer: Medicare HMO

## 2021-06-14 DIAGNOSIS — H5461 Unqualified visual loss, right eye, normal vision left eye: Secondary | ICD-10-CM | POA: Diagnosis not present

## 2021-06-14 DIAGNOSIS — I639 Cerebral infarction, unspecified: Secondary | ICD-10-CM | POA: Diagnosis not present

## 2021-06-14 HISTORY — PX: IR ANGIO VERTEBRAL SEL VERTEBRAL UNI R MOD SED: IMG5368

## 2021-06-14 HISTORY — PX: IR US GUIDE VASC ACCESS RIGHT: IMG2390

## 2021-06-14 HISTORY — PX: IR ANGIO INTRA EXTRACRAN SEL COM CAROTID INNOMINATE BILAT MOD SED: IMG5360

## 2021-06-14 LAB — BASIC METABOLIC PANEL
Anion gap: 6 (ref 5–15)
BUN: 19 mg/dL (ref 8–23)
CO2: 26 mmol/L (ref 22–32)
Calcium: 8.8 mg/dL — ABNORMAL LOW (ref 8.9–10.3)
Chloride: 106 mmol/L (ref 98–111)
Creatinine, Ser: 1.33 mg/dL — ABNORMAL HIGH (ref 0.61–1.24)
GFR, Estimated: 56 mL/min — ABNORMAL LOW (ref >60)
Glucose, Bld: 103 mg/dL — ABNORMAL HIGH (ref 70–99)
Potassium: 3.9 mmol/L (ref 3.5–5.1)
Sodium: 138 mmol/L (ref 135–145)

## 2021-06-14 LAB — LIPID PANEL
Cholesterol: 155 mg/dL (ref 0–200)
HDL: 38 mg/dL — ABNORMAL LOW (ref >40)
LDL Cholesterol: 85 mg/dL (ref 0–99)
Total CHOL/HDL Ratio: 4.1 RATIO
Triglycerides: 158 mg/dL — ABNORMAL HIGH (ref <150)
VLDL: 32 mg/dL (ref 0–40)

## 2021-06-14 LAB — CBC
HCT: 44.6 % (ref 39.0–52.0)
Hemoglobin: 14.4 g/dL (ref 13.0–17.0)
MCH: 29.8 pg (ref 26.0–34.0)
MCHC: 32.3 g/dL (ref 30.0–36.0)
MCV: 92.1 fL (ref 80.0–100.0)
Platelets: 188 10*3/uL (ref 150–400)
RBC: 4.84 MIL/uL (ref 4.22–5.81)
RDW: 14.3 % (ref 11.5–15.5)
WBC: 12.7 10*3/uL — ABNORMAL HIGH (ref 4.0–10.5)
nRBC: 0 % (ref 0.0–0.2)

## 2021-06-14 LAB — MAGNESIUM: Magnesium: 1.9 mg/dL (ref 1.7–2.4)

## 2021-06-14 LAB — TSH: TSH: 3.297 u[IU]/mL (ref 0.350–4.500)
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
## 2021-06-14 MED ORDER — VERAPAMIL HCL 2.5 MG/ML IV SOLN: INTRA_ARTERIAL | Status: AC | PRN

## 2021-06-14 MED ORDER — TICAGRELOR 90 MG PO TABS
300.0000 mg | ORAL_TABLET | Freq: Once | ORAL | Status: AC
  Administered 2021-06-14: 300 mg via ORAL
  Filled 2021-06-14: qty 2

## 2021-06-14 MED ORDER — IOHEXOL 300 MG/ML  SOLN
100.0000 mL | Freq: Once | INTRAMUSCULAR | Status: AC | PRN
  Administered 2021-06-14: 65 mL via INTRA_ARTERIAL

## 2021-06-14 MED ORDER — FENTANYL CITRATE (PF) 100 MCG/2ML IJ SOLN
INTRAMUSCULAR | Status: AC
  Filled 2021-06-14: qty 2

## 2021-06-14 MED ORDER — NITROGLYCERIN 1 MG/10 ML FOR IR/CATH LAB
INTRA_ARTERIAL | Status: AC
  Filled 2021-06-14: qty 10

## 2021-06-14 MED ORDER — TICAGRELOR 90 MG PO TABS
90.0000 mg | ORAL_TABLET | Freq: Two times a day (BID) | ORAL | Status: DC
  Administered 2021-06-15 – 2021-06-17 (×5): 90 mg via ORAL
  Filled 2021-06-14 (×6): qty 1

## 2021-06-14 MED ORDER — FENTANYL CITRATE (PF) 100 MCG/2ML IJ SOLN
INTRAMUSCULAR | Status: AC | PRN
  Administered 2021-06-14: 25 ug via INTRAVENOUS

## 2021-06-14 MED ORDER — MIDAZOLAM HCL 2 MG/2ML IJ SOLN
INTRAMUSCULAR | Status: AC
  Filled 2021-06-14: qty 2

## 2021-06-14 MED ORDER — LIDOCAINE HCL 1 % IJ SOLN
INTRAMUSCULAR | Status: AC
  Filled 2021-06-14: qty 20

## 2021-06-14 MED ORDER — SODIUM CHLORIDE 0.9 % IV SOLN: INTRAVENOUS | Status: AC

## 2021-06-14 MED ORDER — HEPARIN SODIUM (PORCINE) 1000 UNIT/ML IJ SOLN
INTRAMUSCULAR | Status: AC
  Filled 2021-06-14: qty 10

## 2021-06-14 MED ORDER — VERAPAMIL HCL 2.5 MG/ML IV SOLN
INTRAVENOUS | Status: AC
  Filled 2021-06-14: qty 2

## 2021-06-14 MED ORDER — NITROGLYCERIN 1 MG/10 ML FOR IR/CATH LAB: INTRA_ARTERIAL | Status: AC | PRN

## 2021-06-14 MED ORDER — MIDAZOLAM HCL 2 MG/2ML IJ SOLN
INTRAMUSCULAR | Status: AC | PRN
  Administered 2021-06-14: 1 mg via INTRAVENOUS

## 2021-06-14 MED ORDER — LORAZEPAM 2 MG/ML IJ SOLN
0.5000 mg | Freq: Once | INTRAMUSCULAR | Status: AC
  Administered 2021-06-15: 0.5 mg via INTRAVENOUS
  Filled 2021-06-14: qty 1

## 2021-06-14 NOTE — H&P (Signed)
Chief Complaint: Right eye blindness  Referring Physician(s): Donnetta Simpers  Supervising Physician: Luanne Bras  Patient Status: [facility] - Out-pt  History of Present Illness: Stephen Hull is a 75 y.o. male with medical issues including paroxysmal SVT, HTN, Asymptomatic bradycardia, and history of left carotid stenosis.  He presented to [facility] ED on 11/24 c/o right eye vision loss and intermittent numbness of the right side of his face.  He states the vision loss is intermittent and occurs 1-2 times per week and lasts a few seconds before resolving.  CTA head and neck with right proximal ICA occlusion as well as 50% atheromatous narrowing of the left ICA bulb.  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   He tells me he has known right carotid occlusion on the right and 50% stenosis on the left.  We are asked to perform a diagnosis cerebral angiography to better evaluate cerebral circulation.  He ate breakfast at 7:30 am and has been made NPO.  Past Medical History:  Diagnosis Date   Bradycardia    History of tobacco use    quit in 1991   Hypertension    Paroxysmal SVT (supraventricular tachycardia) (HCC)     Past Surgical History:  Procedure Laterality Date   COLONOSCOPY     COLONOSCOPY N/A 11/14/2016   Procedure: COLONOSCOPY;  Surgeon: Daneil Dolin, MD;  Location: [facility];  Service: Endoscopy;  Laterality: N/A;  730    TONSILLECTOMY     as a kid    Allergies: Cashew nut (anacardium occidentale) skin test  Medications: Prior to Admission medications   Medication Sig Start Date End Date Taking? Authorizing Provider  acebutolol (SECTRAL) 200 MG capsule Take 1 capsule by mouth 2 (two) times daily. 06/18/16  Yes [provider]  acetaminophen (TYLENOL) 325 MG  tablet Take 650 mg by mouth every 6 (six) hours as needed for headache.   Yes [provider]  aspirin 81 MG chewable tablet Chew 81 mg by mouth daily.   Yes [provider]  atorvastatin (LIPITOR) 40 MG tablet Take 1 tablet (40 mg total) by mouth daily. 06/06/21 09/04/21 Yes Sande Rives E, PA-C  Cholecalciferol (VITAMIN D3) 2000 units TABS Take 2,000 Units by mouth daily.   Yes [provider]  dextromethorphan-guaiFENesin (MUCINEX DM) 30-600 MG 12hr tablet Take 1 tablet by mouth daily as needed for cough.   Yes [provider]  losartan (COZAAR) 50 MG tablet Take 1 tablet by mouth daily. 06/18/16  Yes [provider]  omeprazole (PRILOSEC) 40 MG capsule Take 1 capsule by mouth daily. 06/18/16  Yes [provider]  tamsulosin (FLOMAX) 0.4 MG CAPS capsule Take 0.4 mg by mouth daily. 06/12/21  Yes [provider]  triamcinolone ointment (KENALOG) 0.5 % Apply 1 application topically 2 (two) times daily as needed (Irritation). 01/27/20  Yes [provider]     Family History  Problem Relation Age of Onset   Cancer Mother        Stomach   Cancer - Prostate Father    Heart attack Father    Colon cancer Neg Hx     Social History   Socioeconomic History   Marital status: Married    Spouse name: Not on file   Number of children: Not on file  Years of education: Not on file   Highest education level: Not on file  Occupational History   Not on file  Tobacco Use   Smoking status: Former    Types: Cigarettes    Quit date: 07/21/1989    Years since quitting: 31.9   Smokeless tobacco: Former    Quit date: 07/21/1989  Vaping Use   Vaping Use: Never used  Substance and Sexual Activity   Alcohol use: No   Drug use: No   Sexual activity: Not on file  Other Topics Concern   Not on file  Social History Narrative   Not on file   Social Determinants of Health   Financial Resource Strain: Not on file  Food Insecurity:  Not on file  Transportation Needs: Not on file  Physical Activity: Not on file  Stress: Not on file  Social Connections: Not on file     Review of Systems: A 12 point ROS discussed and pertinent positives are indicated in the HPI above.  All other systems are negative.  Review of Systems  Vital Signs: BP 130/89   Pulse 82   Temp 97.9 F (36.6 C) (Oral)   Resp 17   Ht 5\' 11"  (1.803 m)   Wt 115.7 kg   SpO2 97%   BMI 35.57 kg/m   Physical Exam Vitals reviewed.  Constitutional:      Appearance: Normal appearance.  HENT:     Head: Normocephalic and atraumatic.  Eyes:     Extraocular Movements: Extraocular movements intact.  Cardiovascular:     Rate and Rhythm: Normal rate and regular rhythm.  Pulmonary:     Effort: Pulmonary effort is normal. No respiratory distress.     Breath sounds: Normal breath sounds.  Abdominal:     General: There is no distension.     Palpations: Abdomen is soft.     Tenderness: There is no abdominal tenderness.  Musculoskeletal:        General: Normal range of motion.     Cervical back: Normal range of motion.  Skin:    General: Skin is warm and dry.  Neurological:     General: No focal deficit present.     Mental Status: He is alert and oriented to person, place, and time.     Cranial Nerves: No cranial nerve deficit.     Sensory: No sensory deficit.  Psychiatric:        Mood and Affect: Mood normal.        Behavior: Behavior normal.        Thought Content: Thought content normal.        Judgment: Judgment normal.    Imaging: CT Angio Head W or Wo Contrast  Result Date: 06/13/2021 CLINICAL DATA:  Headache with right-sided vision loss. Numbness to the right side of face and arm for several weeks EXAM: CT ANGIOGRAPHY HEAD AND NECK TECHNIQUE: Multidetector CT imaging of the head and neck was performed using the standard protocol during bolus administration of intravenous contrast. Multiplanar CT image reconstructions and MIPs were  obtained to evaluate the vascular anatomy. Carotid stenosis measurements (when applicable) are obtained utilizing NASCET criteria, using the distal internal carotid diameter as the denominator. CONTRAST:  20mL OMNIPAQUE IOHEXOL 350 MG/ML SOLN COMPARISON:  Brain MRI 08/11/2016 FINDINGS: CT HEAD FINDINGS Brain: No evidence of acute infarction, hemorrhage, hydrocephalus, extra-axial collection or mass lesion/mass effect. Vascular: No hyperdense vessel or unexpected calcification. Skull: Normal. Negative for fracture or focal lesion. Sinuses: Imaged portions are clear.  Orbits: No acute finding. Review of the MIP images confirms the above findings CTA NECK FINDINGS Aortic arch: Atheromatous plaque. Minimal coverage. Likely 2 vessel branching. Right carotid system: Irregular low-density plaque with ulceration at the bifurcation/bulb and with proximal ICA occlusion and no detectable opacification of the downstream vessel until intracranial reconstitution. Flow void was seen at this level on prior. Left carotid system: Atheromatous wall thickening with mixed density plaque at the bulb/bifurcation causing 50% stenosis. Vertebral arteries: Subclavian atherosclerosis without flow limiting stenosis. Codominant vertebral arteries. Extrinsic narrowing of the vertebral arteries from uncovertebral spurs, especially on the right. No intrinsic stenosis. Skeleton: Generalized cervical spine degeneration. Generalized osteopenia. Other neck: Negative Upper chest: Clear apical lungs Review of the MIP images confirms the above findings CTA HEAD FINDINGS Anterior circulation: Right ICA reconstitution primarily at the ophthalmic and supraclinoid segment. Symmetric enhancement of MCA branches but hypoplastic right A1 segment and small right posterior communicating artery. No downstream embolus is seen. Posterior circulation: The vertebral and basilar arteries are smoothly contoured and widely patent. Atheromatous plaque on the vertebral  arteries. No branch occlusion, beading, or aneurysm. Venous sinuses: Negative Anatomic variants: As above Review of the MIP images confirms the above findings Critical Value/emergent results were called by telephone at the time of interpretation on 06/13/2021 at 8:58 am to provider Dr Effie Shy , who verbally acknowledged these results. IMPRESSION: 1. 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 There is intracranial reconstitution but nearly isolated right MCA given hypoplastic right A1 segment and a small right posterior communicating artery. 2. 50% atheromatous narrowing at the left ICA bulb. 3. Moderate right V2 segment narrowing due to extrinsic stenosis from endplate spur. Electronically Signed   By: Tiburcio Pea M.D.   On: 06/13/2021 08:59   CT Angio Neck W and/or Wo Contrast  Result Date: 06/13/2021 CLINICAL DATA:  Headache with right-sided vision loss. Numbness to the right side of face and arm for several weeks EXAM: CT ANGIOGRAPHY HEAD AND NECK TECHNIQUE: Multidetector CT imaging of the head and neck was performed using the standard protocol during bolus administration of intravenous contrast. Multiplanar CT image reconstructions and MIPs were obtained to evaluate the vascular anatomy. Carotid stenosis measurements (when applicable) are obtained utilizing NASCET criteria, using the distal internal carotid diameter as the denominator. CONTRAST:  54mL OMNIPAQUE IOHEXOL 350 MG/ML SOLN COMPARISON:  Brain MRI 08/11/2016 FINDINGS: CT HEAD FINDINGS Brain: No evidence of acute infarction, hemorrhage, hydrocephalus, extra-axial collection or mass lesion/mass effect. Vascular: No hyperdense vessel or unexpected calcification. Skull: Normal. Negative for fracture or focal lesion. Sinuses: Imaged portions are clear. Orbits: No acute finding. Review of the MIP images confirms the above findings CTA NECK FINDINGS Aortic arch: Atheromatous plaque. Minimal coverage. Likely 2 vessel branching. Right  carotid system: Irregular low-density plaque with ulceration at the bifurcation/bulb and with proximal ICA occlusion and no detectable opacification of the downstream vessel until intracranial reconstitution. Flow void was seen at this level on prior. Left carotid system: Atheromatous wall thickening with mixed density plaque at the bulb/bifurcation causing 50% stenosis. Vertebral arteries: Subclavian atherosclerosis without flow limiting stenosis. Codominant vertebral arteries. Extrinsic narrowing of the vertebral arteries from uncovertebral spurs, especially on the right. No intrinsic stenosis. Skeleton: Generalized cervical spine degeneration. Generalized osteopenia. Other neck: Negative Upper chest: Clear apical lungs Review of the MIP images confirms the above findings CTA HEAD FINDINGS Anterior circulation: Right ICA reconstitution primarily at the ophthalmic and supraclinoid segment. Symmetric enhancement of MCA branches but hypoplastic right A1 segment  and small right posterior communicating artery. No downstream embolus is seen. Posterior circulation: The vertebral and basilar arteries are smoothly contoured and widely patent. Atheromatous plaque on the vertebral arteries. No branch occlusion, beading, or aneurysm. Venous sinuses: Negative Anatomic variants: As above Review of the MIP images confirms the above findings Critical Value/emergent results were called by telephone at the time of interpretation on 06/13/2021 at 8:58 am to provider Dr Eulis Foster , who verbally acknowledged these results. IMPRESSION: 1. 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 There is intracranial reconstitution but nearly isolated right MCA given hypoplastic right A1 segment and a small right posterior communicating artery. 2. 50% atheromatous narrowing at the left ICA bulb. 3. Moderate right V2 segment narrowing due to extrinsic stenosis from endplate spur. Electronically Signed   By: Jorje Guild M.D.   On:  06/13/2021 08:59   MR ANGIO HEAD WO CONTRAST  Result Date: 06/13/2021 CLINICAL DATA:  Stroke, follow-up.  Right-sided vision loss. EXAM: MRI HEAD WITHOUT CONTRAST MRA HEAD WITHOUT CONTRAST MRA OF THE NECK WITHOUT AND WITH CONTRAST TECHNIQUE: Multiplanar, multi-echo pulse sequences of the brain and surrounding structures were acquired without intravenous contrast. Angiographic images of the Circle of Willis were acquired using MRA technique without intravenous contrast. Angiographic images of the neck were acquired using MRA technique without and with intravenous contrast. Carotid stenosis measurements (when applicable) are obtained utilizing NASCET criteria, using the distal internal carotid diameter as the denominator. CONTRAST:  77mL GADAVIST GADOBUTROL 1 MMOL/ML IV SOLN COMPARISON:  Head and neck CTA 06/13/2021 FINDINGS: MR HEAD FINDINGS Some sequences are mildly to moderately motion degraded. Brain: There is a small acute infarct involving the right caudate body, and there is question of a punctate acute subcortical white matter infarct in the posterior right frontal lobe. T2 hyperintensities in the cerebral white matter nonspecific but compatible with minimal chronic small vessel ischemic disease, not advanced for age. Mild cerebral atrophy is within normal limits for age. There are chronic lacunar infarcts in the left lentiform nucleus and left cerebellar hemisphere. Vascular: Known right ICA occlusion. Skull and upper cervical spine: Unremarkable bone marrow signal. Sinuses/Orbits: Unremarkable orbits. Paranasal sinuses and mastoid air cells are clear. Other: None. MRA HEAD FINDINGS Anterior circulation: The intracranial right ICA is occluded proximally with reconstitution of the supraclinoid segment and terminus. The intracranial left ICA is widely patent. ACAs and MCAs are patent without evidence of a proximal branch occlusion or significant M1 or left A1 stenosis. The right A1 segment is hypoplastic.  No aneurysm is identified. Posterior circulation: The intracranial vertebral arteries are widely patent to the basilar. The basilar artery is widely patent. There is a patent right posterior communicating artery. Both PCAs are patent without evidence of a significant proximal stenosis. No aneurysm is identified. Anatomic variants:None. MRA NECK FINDINGS Aortic arch: Common origin of the brachiocephalic and left common carotid arteries. Widely patent arch vessel origins. Signal loss in the proximal right subclavian artery, likely artifactual due to vessel tortuosity and motion. Right carotid system: Patent common carotid artery. Unchanged proximal occlusion of the ICA without reconstitution in the neck. Left carotid system: Signal loss in the proximal common carotid artery, likely artifactual due to motion given normal appearance on the earlier CTA. Wide patency of the more distal common carotid artery. 50% stenosis of the ICA origin as shown on CTA. Vertebral arteries: The vertebral arteries are patent and codominant with antegrade flow bilaterally and without evidence of a significant stenosis on the left. There is  moderate proximal right V2 stenosis which is due to extrinsic compression from uncovertebral spurring based on CTA. IMPRESSION: 1. Small acute infarct in the right caudate body. 2. Questionable punctate acute white matter infarct in the posterior right frontal lobe. 3. 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 4. 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 5. 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 Electronically Signed   By: Logan Bores M.D.   On: 06/13/2021 17:41   MR ANGIO NECK W WO CONTRAST  Result Date: 06/13/2021 CLINICAL DATA:  Stroke, follow-up.  Right-sided vision loss. EXAM: MRI HEAD WITHOUT CONTRAST MRA HEAD WITHOUT CONTRAST MRA OF THE NECK WITHOUT AND WITH CONTRAST TECHNIQUE: Multiplanar, multi-echo pulse sequences of the  brain and surrounding structures were acquired without intravenous contrast. Angiographic images of the Circle of Willis were acquired using MRA technique without intravenous contrast. Angiographic images of the neck were acquired using MRA technique without and with intravenous contrast. Carotid stenosis measurements (when applicable) are obtained utilizing NASCET criteria, using the distal internal carotid diameter as the denominator. CONTRAST:  17mL GADAVIST GADOBUTROL 1 MMOL/ML IV SOLN COMPARISON:  Head and neck CTA 06/13/2021 FINDINGS: MR HEAD FINDINGS Some sequences are mildly to moderately motion degraded. Brain: There is a small acute infarct involving the right caudate body, and there is question of a punctate acute subcortical white matter infarct in the posterior right frontal lobe. T2 hyperintensities in the cerebral white matter nonspecific but compatible with minimal chronic small vessel ischemic disease, not advanced for age. Mild cerebral atrophy is within normal limits for age. There are chronic lacunar infarcts in the left lentiform nucleus and left cerebellar hemisphere. Vascular: Known right ICA occlusion. Skull and upper cervical spine: Unremarkable bone marrow signal. Sinuses/Orbits: Unremarkable orbits. Paranasal sinuses and mastoid air cells are clear. Other: None. MRA HEAD FINDINGS Anterior circulation: The intracranial right ICA is occluded proximally with reconstitution of the supraclinoid segment and terminus. The intracranial left ICA is widely patent. ACAs and MCAs are patent without evidence of a proximal branch occlusion or significant M1 or left A1 stenosis. The right A1 segment is hypoplastic. No aneurysm is identified. Posterior circulation: The intracranial vertebral arteries are widely patent to the basilar. The basilar artery is widely patent. There is a patent right posterior communicating artery. Both PCAs are patent without evidence of a significant proximal stenosis. No  aneurysm is identified. Anatomic variants:None. MRA NECK FINDINGS Aortic arch: Common origin of the brachiocephalic and left common carotid arteries. Widely patent arch vessel origins. Signal loss in the proximal right subclavian artery, likely artifactual due to vessel tortuosity and motion. Right carotid system: Patent common carotid artery. Unchanged proximal occlusion of the ICA without reconstitution in the neck. Left carotid system: Signal loss in the proximal common carotid artery, likely artifactual due to motion given normal appearance on the earlier CTA. Wide patency of the more distal common carotid artery. 50% stenosis of the ICA origin as shown on CTA. Vertebral arteries: The vertebral arteries are patent and codominant with antegrade flow bilaterally and without evidence of a significant stenosis on the left. There is moderate proximal right V2 stenosis which is due to extrinsic compression from uncovertebral spurring based on CTA. IMPRESSION: 1. Small acute infarct in the right caudate body. 2. Questionable punctate acute white matter infarct in the posterior right frontal lobe. 3. 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 4. 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 5. Unchanged 50%  stenosis of the left ICA origin. Electronically Signed   By: Logan Bores M.D.   On: 06/13/2021 17:41   MR BRAIN WO CONTRAST  Result Date: 06/13/2021 CLINICAL DATA:  Stroke, follow-up.  Right-sided vision loss. EXAM: MRI HEAD WITHOUT CONTRAST MRA HEAD WITHOUT CONTRAST MRA OF THE NECK WITHOUT AND WITH CONTRAST TECHNIQUE: Multiplanar, multi-echo pulse sequences of the brain and surrounding structures were acquired without intravenous contrast. Angiographic images of the Circle of Willis were acquired using MRA technique without intravenous contrast. Angiographic images of the neck were acquired using MRA technique without and with intravenous contrast. Carotid  stenosis measurements (when applicable) are obtained utilizing NASCET criteria, using the distal internal carotid diameter as the denominator. CONTRAST:  5mL GADAVIST GADOBUTROL 1 MMOL/ML IV SOLN COMPARISON:  Head and neck CTA 06/13/2021 FINDINGS: MR HEAD FINDINGS Some sequences are mildly to moderately motion degraded. Brain: There is a small acute infarct involving the right caudate body, and there is question of a punctate acute subcortical white matter infarct in the posterior right frontal lobe. T2 hyperintensities in the cerebral white matter nonspecific but compatible with minimal chronic small vessel ischemic disease, not advanced for age. Mild cerebral atrophy is within normal limits for age. There are chronic lacunar infarcts in the left lentiform nucleus and left cerebellar hemisphere. Vascular: Known right ICA occlusion. Skull and upper cervical spine: Unremarkable bone marrow signal. Sinuses/Orbits: Unremarkable orbits. Paranasal sinuses and mastoid air cells are clear. Other: None. MRA HEAD FINDINGS Anterior circulation: The intracranial right ICA is occluded proximally with reconstitution of the supraclinoid segment and terminus. The intracranial left ICA is widely patent. ACAs and MCAs are patent without evidence of a proximal branch occlusion or significant M1 or left A1 stenosis. The right A1 segment is hypoplastic. No aneurysm is identified. Posterior circulation: The intracranial vertebral arteries are widely patent to the basilar. The basilar artery is widely patent. There is a patent right posterior communicating artery. Both PCAs are patent without evidence of a significant proximal stenosis. No aneurysm is identified. Anatomic variants:None. MRA NECK FINDINGS Aortic arch: Common origin of the brachiocephalic and left common carotid arteries. Widely patent arch vessel origins. Signal loss in the proximal right subclavian artery, likely artifactual due to vessel tortuosity and motion. Right  carotid system: Patent common carotid artery. Unchanged proximal occlusion of the ICA without reconstitution in the neck. Left carotid system: Signal loss in the proximal common carotid artery, likely artifactual due to motion given normal appearance on the earlier CTA. Wide patency of the more distal common carotid artery. 50% stenosis of the ICA origin as shown on CTA. Vertebral arteries: The vertebral arteries are patent and codominant with antegrade flow bilaterally and without evidence of a significant stenosis on the left. There is moderate proximal right V2 stenosis which is due to extrinsic compression from uncovertebral spurring based on CTA. IMPRESSION: 1. Small acute infarct in the right caudate body. 2. Questionable punctate acute white matter infarct in the posterior right frontal lobe. 3. 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 4. 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 5. 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 Electronically Signed   By: Logan Bores M.D.   On: 06/13/2021 17:41   ECHOCARDIOGRAM COMPLETE  Result Date: 06/13/2021    ECHOCARDIOGRAM REPORT   Patient Name:   Stephen Hull Date of Exam: 06/13/2021 Medical Rec #:  [id number]         Height:  71.0 in Accession #:    [id number]        Weight:       255.0 lb Date of Birth:  February 13, 1946        BSA:          2.338 m Patient Age:    31 years          BP:           149/91 mmHg Patient Gender: M                 HR:           83 bpm. Exam Location:  [facility] Procedure: 2D Echo, Cardiac Doppler and Color Doppler Indications:    Stroke  History:        Patient has no prior history of Echocardiogram examinations.                 Abnormal ECG, Stroke; Risk Factors:Hypertension.  Sonographer:    Wenda Low Referring Phys: Bailey  1. Left ventricular ejection fraction, by estimation, is 55 to 60%. The left ventricle has normal function. The  left ventricle has no regional wall motion abnormalities. Left ventricular diastolic parameters are indeterminate.  2. Right ventricular systolic function is normal. The right ventricular size is normal. There is normal pulmonary artery systolic pressure. The estimated right ventricular systolic pressure is 0000000 mmHg.  3. Left atrial size was mildly dilated.  4. The mitral valve is grossly normal. Trivial mitral valve regurgitation. No evidence of mitral stenosis.  5. The aortic valve is tricuspid. Aortic valve regurgitation is not visualized. No aortic stenosis is present.  6. The inferior vena cava is normal in size with greater than 50% respiratory variability, suggesting right atrial pressure of 3 mmHg. Conclusion(s)/Recommendation(s): No intracardiac source of embolism detected on this transthoracic study. Consider a transesophageal echocardiogram to exclude cardiac source of embolism if clinically indicated. FINDINGS  Left Ventricle: Left ventricular ejection fraction, by estimation, is 55 to 60%. The left ventricle has normal function. The left ventricle has no regional wall motion abnormalities. The left ventricular internal cavity size was normal in size. There is  no left ventricular hypertrophy. Left ventricular diastolic parameters are indeterminate. Right Ventricle: The right ventricular size is normal. No increase in right ventricular wall thickness. Right ventricular systolic function is normal. There is normal pulmonary artery systolic pressure. The tricuspid regurgitant velocity is 2.54 m/s, and  with an assumed right atrial pressure of 3 mmHg, the estimated right ventricular systolic pressure is 0000000 mmHg. Left Atrium: Left atrial size was mildly dilated. Right Atrium: Right atrial size was normal in size. Pericardium: Trivial pericardial effusion is present. Presence of epicardial fat layer. Mitral Valve: The mitral valve is grossly normal. Trivial mitral valve regurgitation. No evidence of mitral  valve stenosis. MV peak gradient, 4.8 mmHg. The mean mitral valve gradient is 2.0 mmHg. Tricuspid Valve: The tricuspid valve is grossly normal. Tricuspid valve regurgitation is trivial. No evidence of tricuspid stenosis. Aortic Valve: The aortic valve is tricuspid. Aortic valve regurgitation is not visualized. No aortic stenosis is present. Aortic valve mean gradient measures 2.7 mmHg. Aortic valve peak gradient measures 5.7 mmHg. Aortic valve area, by VTI measures 2.78 cm. Pulmonic Valve: The pulmonic valve was grossly normal. Pulmonic valve regurgitation is trivial. No evidence of pulmonic stenosis. Aorta: The aortic root and ascending aorta are structurally normal, with no evidence of dilitation. Venous: The inferior vena cava is normal in size with  greater than 50% respiratory variability, suggesting right atrial pressure of 3 mmHg. IAS/Shunts: The atrial septum is grossly normal.  LEFT VENTRICLE PLAX 2D LVIDd:         5.10 cm     Diastology LVIDs:         3.50 cm     LV e' medial:    5.98 cm/s LV PW:         1.20 cm     LV E/e' medial:  16.5 LV IVS:        1.20 cm     LV e' lateral:   10.80 cm/s LVOT diam:     2.00 cm     LV E/e' lateral: 9.1 LV SV:         70 LV SV Index:   30 LVOT Area:     3.14 cm  LV Volumes (MOD) LV vol d, MOD A2C: 56.2 ml LV vol d, MOD A4C: 76.6 ml LV vol s, MOD A2C: 29.9 ml LV vol s, MOD A4C: 26.7 ml LV SV MOD A2C:     26.3 ml LV SV MOD A4C:     76.6 ml LV SV MOD BP:      39.2 ml RIGHT VENTRICLE RV Basal diam:  3.80 cm RV Mid diam:    3.10 cm RV S prime:     12.20 cm/s TAPSE (M-mode): 3.4 cm LEFT ATRIUM             Index        RIGHT ATRIUM           Index LA diam:        5.10 cm 2.18 cm/m   RA Area:     19.00 cm LA Vol (A2C):   79.0 ml 33.79 ml/m  RA Volume:   53.40 ml  22.84 ml/m LA Vol (A4C):   98.5 ml 42.13 ml/m LA Biplane Vol: 92.7 ml 39.65 ml/m  AORTIC VALVE                    PULMONIC VALVE AV Area (Vmax):    2.30 cm     PV Vmax:       0.90 m/s AV Area (Vmean):   2.47 cm      PV Peak grad:  3.2 mmHg AV Area (VTI):     2.78 cm AV Vmax:           119.33 cm/s AV Vmean:          76.200 cm/s AV VTI:            0.252 m AV Peak Grad:      5.7 mmHg AV Mean Grad:      2.7 mmHg LVOT Vmax:         87.50 cm/s LVOT Vmean:        59.900 cm/s LVOT VTI:          0.223 m LVOT/AV VTI ratio: 0.88  AORTA Ao Root diam: 3.30 cm Ao Asc diam:  3.00 cm MITRAL VALVE               TRICUSPID VALVE MV Area (PHT): 3.31 cm    TR Peak grad:   25.8 mmHg MV Area VTI:   1.90 cm    TR Vmax:        254.00 cm/s MV Peak grad:  4.8 mmHg MV Mean grad:  2.0 mmHg    SHUNTS MV Vmax:       1.09 m/s  Systemic VTI:  0.22 m MV Vmean:      59.9 cm/s   Systemic Diam: 2.00 cm MV Decel Time: 229 msec MV E velocity: 98.50 cm/s MV A velocity: 90.00 cm/s MV E/A ratio:  1.09 Eleonore Chiquito MD Electronically signed by Eleonore Chiquito MD Signature Date/Time: 06/13/2021/6:27:28 PM    Final     Labs:  CBC: Recent Labs    06/13/21 0725 06/13/21 0731 06/14/21 0219  WBC 11.6*  --  12.7*  HGB 15.0 16.0 14.4  HCT 45.9 47.0 44.6  PLT 192  --  188    COAGS: Recent Labs    06/13/21 0725  INR 1.0  APTT 29    BMP: Recent Labs    06/13/21 0725 06/13/21 0731 06/14/21 0219  NA 139 142 138  K 3.9 4.1 3.9  CL 106 104 106  CO2 25  --  26  GLUCOSE 106* 105* 103*  BUN 23 23 19   CALCIUM 8.7*  --  8.8*  CREATININE 1.25* 1.20 1.33*  GFRNONAA >60  --  56*    LIVER FUNCTION TESTS: Recent Labs    06/13/21 0725  BILITOT 1.0  AST 23  ALT 27  ALKPHOS 61  PROT 6.7  ALBUMIN 3.8    TUMOR MARKERS: No results for input(s): AFPTM, CEA, CA199, CHROMGRNA in the last 8760 hours.  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  Will proceed with diagnostic cerebral angiography today by Dr. Estanislado Pandy.  Risks and benefits of cerebral angiogram with intervention were discussed with the patient including, but not limited to bleeding,  infection, vascular injury, contrast induced renal failure, stroke or even death.  This interventional procedure involves the use of X-rays and because of the nature of the planned procedure, it is possible that we will have prolonged use of X-ray fluoroscopy.  Potential radiation risks to you include (but are not limited to) the following: - A slightly elevated risk for cancer  several years later in life. This risk is typically less than 0.5% percent. This risk is low in comparison to the normal incidence of human cancer, which is 33% for women and 50% for men according to the [place]. - Radiation induced injury can include skin redness, resembling a rash, tissue breakdown / ulcers and hair loss (which can be temporary or permanent).   The likelihood of either of these occurring depends on the difficulty of the procedure and whether you are sensitive to radiation due to previous procedures, disease, or genetic conditions.   IF your procedure requires a prolonged use of radiation, you will be notified and given written instructions for further action.  It is your responsibility to monitor the irradiated area for the 2 weeks following the procedure and to notify your physician if you are concerned that you have suffered a radiation induced injury.    All of the patient's questions were answered, patient is agreeable to proceed.  Consent signed and in chart.  Thank you for allowing our service to participate in KAVEION NATALE 's care.  Electronically Signed: Murrell Redden, PA-C   06/14/2021, 11:36 AM      I spent a total of 20 Minutes in face to face in clinical consultation, greater than 50% of which was counseling/coordinating care for cerebral angiography.

## 2021-06-14 NOTE — Evaluation (Signed)
Physical Therapy Evaluation and Discharge Patient Details Name: Stephen Hull MRN: [id number] DOB: 30-Sep-1945 Today's Date: 06/14/2021  History of Present Illness  Pt is a 75 y/o male who presents with R eye vision loss. 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 PMH significant for bradycardia, HTN, paroxysmal SVT.  Clinical Impression  Patient evaluated by Physical Therapy with no further acute PT needs identified. All education has been completed and the patient has no further questions. Pt reports little black spots in his R eye that would last a few seconds and then go away, that had been happening a few times a week for ~2 months. Today, pt reports minimal vision in R eye if there was light, however otherwise reports vision is black. Despite this, pt was tracking fairly well during testing. OT to further assess vision. Pt was able to perform transfers and ambulation with independence and no AD. Pt accommodating well for vision loss in the R eye and was scanning to the R side of the hall throughout gait training to avoid obstacles. See below for any follow-up Physical Therapy or equipment needs. PT is signing off. Thank you for this referral.        Recommendations for follow up therapy are one component of a multi-disciplinary discharge planning process, led by the attending physician.  Recommendations may be updated based on patient status, additional functional criteria and insurance authorization.  Follow Up Recommendations No PT follow up    Assistance Recommended at Discharge PRN  Functional Status Assessment Patient has had a recent decline in their functional status and demonstrates the ability to make significant improvements in function in a reasonable and predictable amount of time.  Equipment  Recommendations  None recommended by PT    Recommendations for Other Services       Precautions / Restrictions Precautions Precautions: Fall Restrictions Weight Bearing Restrictions: No      Mobility  Bed Mobility Overal bed mobility: Independent                  Transfers Overall transfer level: Independent Equipment used: None                    Ambulation/Gait Ambulation/Gait assistance: Modified independent (Device/Increase time) Gait Distance (Feet): 400 Feet Assistive device: None Gait Pattern/deviations: WFL(Within Functional Limits) Gait velocity: Mildly decreased - appropriate for the space we were in. Gait velocity interpretation: >2.62 ft/sec, indicative of community ambulatory   General Gait Details: Pt scanning hallway without cues to do so, to accommodate for R side visual loss.  Stairs            Wheelchair Mobility    Modified Rankin (Stroke Patients Only) Modified Rankin (Stroke Patients Only) Pre-Morbid Rankin Score: No symptoms Modified Rankin: Slight disability     Balance Overall balance assessment: Modified Independent                                           Pertinent Vitals/Pain Pain Assessment: No/denies pain    Home Living Family/patient expects to be discharged to:: Private residence Living Arrangements: Spouse/significant other Available Help at Discharge: Family;Available 24 hours/day Type of Home: House Home Access: Ramped entrance  Home Layout: One level Home Equipment: Conservation officer, nature (2 wheels);Shower seat - built in;Grab bars - toilet;Grab bars - tub/shower;Adaptive equipment;Cane - single point;Toilet riser      Prior Function Prior Level of Function : Independent/Modified Independent;Driving;Working/employed;History of Falls (last six months) (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 [place] out the back of a trailer 6 months ago.)             Mobility  Comments: Not requiring any assistive devices ADLs Comments: Independent with ADL's. Has been doing cooking/cleaning around the house.     Hand Dominance   Dominant Hand: Right    Extremity/Trunk Assessment   Upper Extremity Assessment Upper Extremity Assessment: Defer to OT evaluation    Lower Extremity Assessment Lower Extremity Assessment: Overall WFL for tasks assessed (5/5 strength in quads, hip flexors, ankle DF. 4+/5 strength in hamstrings which pt reports is baseline.)    Cervical / Trunk Assessment Cervical / Trunk Assessment: Normal  Communication   Communication: No difficulties  Cognition Arousal/Alertness: Awake/alert Behavior During Therapy: WFL for tasks assessed/performed Overall Cognitive Status: Within Functional Limits for tasks assessed                                          General Comments      Exercises     Assessment/Plan    PT Assessment Patient does not need any further PT services  PT Problem List         PT Treatment Interventions      PT Goals (Current goals can be found in the Care Plan section)  Acute Rehab PT Goals Patient Stated Goal: Home today PT Goal Formulation: All assessment and education complete, DC therapy    Frequency     Barriers to discharge        Co-evaluation               AM-PAC PT "6 Clicks" Mobility  Outcome Measure Help needed turning from your back to your side while in a flat bed without using bedrails?: None Help needed moving from lying on your back to sitting on the side of a flat bed without using bedrails?: None Help needed moving to and from a bed to a chair (including a wheelchair)?: None Help needed standing up from a chair using your arms (e.g., wheelchair or bedside chair)?: None Help needed to walk in hospital room?: None Help needed climbing 3-5 steps with a railing? : None 6 Click Score: 24    End of Session   Activity Tolerance: Patient tolerated treatment  well Patient left: in bed;with call bell/phone within reach;with family/visitor present;Other (comment) (Neuro team present) Nurse Communication: Mobility status PT Visit Diagnosis: Other symptoms and signs involving the nervous system (R29.898)    Time: 0940-1000 PT Time Calculation (min) (ACUTE ONLY): 20 min   Charges:   PT Evaluation $PT Eval Low Complexity: [place], PT, DPT Acute Rehabilitation Services Pager: [phone] Office: [phone]   Thelma Comp 06/14/2021, 10:17 AM
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
## 2021-06-14 NOTE — Procedures (Signed)
S/P bilateral common carotid and RT Vert artery angiogram RT Rad approach. Findings. 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 2.? Focal dissection just distal to the bulb.  3.Patent distal RT ICA  intracranially. S.Zared Knoth MD
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
## 2021-06-14 NOTE — Progress Notes (Signed)
[facility] PROGRESS NOTE   Stephen Hull ALP:[id number] DOB: February 27, 1946 DOA: 06/13/2021  PCP: Richmond Campbell., PA-C  Brief History/Interval Summary: Stephen Hull  is a 75 y.o. male who is a reformed smoker (quit in 1991) with past medical history relevant for HTN, HLD, PSVT as well as history of chronic asymptomatic bradycardia and history of left carotid artery stenosis presented to the ED with sudden onset of right eye vision loss and headache starting around 4:30 AM on day of admission. -Last known well around 9:30 PM when he went to bed -He reports intermittent loss of vision of the inferior visual field of the right eye for the past 2 days -Lately patient has been having intermittent right facial and right upper extremity numbness and tingling and he was undergoing work-up at the [place] and with his PCP at [facility] -Took Tylenol PTA  EDP discussed case with on-call neurologist recommended transfer to [facility] for further neurology work-up and MRI -In ED ocular ultrasound was done by EDP see documentation in the EDP (Dr Acey Lav)  notes  Patient was transferred to [facility] for further work-up  Reason for Visit: Acute stroke  Consultants: Neurology  Procedures: Transthoracic echocardiogram    Subjective/Interval History: Patient denies any headaches.  No chest pain shortness of breath nausea or vomiting.  No improvement in his vision on the right side.     Assessment/Plan:  Acute vision loss/acute stroke Patient has undergone MRI brain, MRA head and neck, transthoracic echocardiogram.  Echocardiogram shows normal systolic function.  No embolic source identified. 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 Patient is currently on aspirin and Plavix.  Stroke team to evaluate today. LDL is 85.  Patient is on statin. HbA1c is 5.4.  TSH 3.2.  Urine drug screen unremarkable. PT and OT evaluation. Further management per  neurology.  Essential hypertension Monitor blood pressures closely.  Allowing some degree of permissive hypertension.  History of PSVT Stable currently.  Continue his beta-blocker.  Obesity Estimated body mass index is 35.57 kg/m as calculated from the following:   Height as of this encounter: 5\' 11"  (1.803 m).   Weight as of this encounter: 115.7 kg.    DVT Prophylaxis: Heparin subcutaneous Code Status: Full code Family Communication: Discussed with the patient Disposition Plan: Hopefully return home when work-up is complete  Status is: Observation  The patient remains OBS appropriate and will most likely d/c before 2 midnights.     Medications: Scheduled:  acebutolol  200 mg Oral BID   aspirin EC  81 mg Oral Q breakfast   atorvastatin  40 mg Oral Once   atorvastatin  80 mg Oral Daily   cholecalciferol  2,000 Units Oral Daily   clopidogrel  75 mg Oral Daily   heparin  5,000 Units Subcutaneous Q8H   losartan  50 mg Oral Daily   pantoprazole  40 mg Oral Daily   sodium chloride flush  3 mL Intravenous Q12H   sodium chloride flush  3 mL Intravenous Q12H   Continuous:  sodium chloride     chloride, acetaminophen **OR** acetaminophen, bisacodyl, hydrALAZINE, LORazepam, ondansetron **OR** ondansetron (ZOFRAN) IV, polyethylene glycol, sodium chloride flush, traZODone  Antibiotics: Anti-infectives (From admission, onward)    None       Objective:  Vital Signs  Vitals:   06/13/21 2337 06/14/21 0354 06/14/21 0800 06/14/21 0828  BP: 120/78 114/71  130/89  Pulse: 66 83  82  Resp: 06/16/21)  28 (!) 24 17   Temp: 98 F (36.7 C) (!) 97.4 F (36.3 C)  97.9 F (36.6 C)  TempSrc: Oral Oral  Oral  SpO2: 97%   97%  Weight:      Height:        Intake/Output Summary (Last 24 hours) at 06/14/2021 1155 Last data filed at 06/14/2021 0745 Gross per 24 hour  Intake 120 ml  Output --  Net 120 ml   Filed Weights   06/13/21 0647  Weight: 115.7 kg    General  appearance: Awake alert.  In no distress Resp: Clear to auscultation bilaterally.  Normal effort Cardio: S1-S2 is normal regular.  No S3-S4.  No rubs murmurs or bruit GI: Abdomen is soft.  Nontender nondistended.  Bowel sounds are present normal.  No masses organomegaly Extremities: No edema.  Full range of motion of lower extremities. Neurologic: Alert and oriented x3.  No focal neurological deficits.    Lab Results:  Data Reviewed: I have personally reviewed following labs and imaging studies  CBC: Recent Labs  Lab 06/13/21 0725 06/13/21 0731 06/14/21 0219  WBC 11.6*  --  12.7*  NEUTROABS 7.9*  --   --   HGB 15.0 16.0 14.4  HCT 45.9 47.0 44.6  MCV 92.7  --  92.1  PLT 192  --  0000000    Basic Metabolic Panel: Recent Labs  Lab 06/13/21 0725 06/13/21 0731 06/14/21 0219  NA 139 142 138  K 3.9 4.1 3.9  CL 106 104 106  CO2 25  --  26  GLUCOSE 106* 105* 103*  BUN 23 23 19   CREATININE 1.25* 1.20 1.33*  CALCIUM 8.7*  --  8.8*    GFR: Estimated Creatinine Clearance: 64 mL/min (A) (by C-G formula based on SCr of 1.33 mg/dL (H)).  Liver Function Tests: Recent Labs  Lab 06/13/21 0725  AST 23  ALT 27  ALKPHOS 61  BILITOT 1.0  PROT 6.7  ALBUMIN 3.8     Coagulation Profile: Recent Labs  Lab 06/13/21 0725  INR 1.0     HbA1C: Recent Labs    06/13/21 0725  HGBA1C 5.4     Lipid Profile: Recent Labs    06/14/21 0219  CHOL 155  HDL 38*  LDLCALC 85  TRIG 158*  CHOLHDL 4.1    Thyroid Function Tests: Recent Labs    06/14/21 0219  TSH 3.297     Recent Results (from the past 240 hour(s))  Resp Panel by RT-PCR (Flu A&B, Covid) Nasopharyngeal Swab     Status: None   Collection Time: 06/13/21  7:42 AM   Specimen: Nasopharyngeal Swab; Nasopharyngeal(NP) swabs in vial transport medium  Result Value Ref Range Status   SARS Coronavirus 2 by RT PCR NEGATIVE NEGATIVE Final    Comment: (NOTE) SARS-CoV-2 target nucleic acids are NOT DETECTED.  The  SARS-CoV-2 RNA is generally detectable in upper respiratory specimens during the acute phase of infection. The lowest concentration of SARS-CoV-2 viral copies this assay can detect is 138 copies/mL. A negative result does not preclude SARS-Cov-2 infection and should not be used as the sole basis for treatment or other patient management decisions. A negative result may occur with  improper specimen collection/handling, submission of specimen other than nasopharyngeal swab, presence of viral mutation(s) within the areas targeted by this assay, and inadequate number of viral copies(<138 copies/mL). A negative result must be combined with clinical observations, patient history, and epidemiological information. The expected result is Negative.  Fact Sheet  for Patients:  EntrepreneurPulse.com.au  Fact Sheet for Healthcare Providers:  IncredibleEmployment.be  This test is no t yet approved or cleared by the [place] FDA and  has been authorized for detection and/or diagnosis of SARS-CoV-2 by FDA under an Emergency Use Authorization (EUA). This EUA will remain  in effect (meaning this test can be used) for the duration of the COVID-19 declaration under Section 564(b)(1) of the Act, 21 U.S.C.section 360bbb-3(b)(1), unless the authorization is terminated  or revoked sooner.       Influenza A by PCR NEGATIVE NEGATIVE Final   Influenza B by PCR NEGATIVE NEGATIVE Final    Comment: (NOTE) The Xpert Xpress SARS-CoV-2/FLU/RSV plus assay is intended as an aid in the diagnosis of influenza from Nasopharyngeal swab specimens and should not be used as a sole basis for treatment. Nasal washings and aspirates are unacceptable for Xpert Xpress SARS-CoV-2/FLU/RSV testing.  Fact Sheet for Patients: EntrepreneurPulse.com.au  Fact Sheet for Healthcare Providers: IncredibleEmployment.be  This test is not yet approved or  cleared by the [place] FDA and has been authorized for detection and/or diagnosis of SARS-CoV-2 by FDA under an Emergency Use Authorization (EUA). This EUA will remain in effect (meaning this test can be used) for the duration of the COVID-19 declaration under Section 564(b)(1) of the Act, 21 U.S.C. section 360bbb-3(b)(1), unless the authorization is terminated or revoked.  Performed at [facility], [address] [place], [place] [address]       Radiology Studies: CT Angio Head W or Wo Contrast  Result Date: 06/13/2021 CLINICAL DATA:  Headache with right-sided vision loss. Numbness to the right side of face and arm for several weeks EXAM: CT ANGIOGRAPHY HEAD AND NECK TECHNIQUE: Multidetector CT imaging of the head and neck was performed using the standard protocol during bolus administration of intravenous contrast. Multiplanar CT image reconstructions and MIPs were obtained to evaluate the vascular anatomy. Carotid stenosis measurements (when applicable) are obtained utilizing NASCET criteria, using the distal internal carotid diameter as the denominator. CONTRAST:  70mL OMNIPAQUE IOHEXOL 350 MG/ML SOLN COMPARISON:  Brain MRI 08/11/2016 FINDINGS: CT HEAD FINDINGS Brain: No evidence of acute infarction, hemorrhage, hydrocephalus, extra-axial collection or mass lesion/mass effect. Vascular: No hyperdense vessel or unexpected calcification. Skull: Normal. Negative for fracture or focal lesion. Sinuses: Imaged portions are clear. Orbits: No acute finding. Review of the MIP images confirms the above findings CTA NECK FINDINGS Aortic arch: Atheromatous plaque. Minimal coverage. Likely 2 vessel branching. Right carotid system: Irregular low-density plaque with ulceration at the bifurcation/bulb and with proximal ICA occlusion and no detectable opacification of the downstream vessel until intracranial reconstitution. Flow void was seen at this level on prior. Left carotid system: Atheromatous wall  thickening with mixed density plaque at the bulb/bifurcation causing 50% stenosis. Vertebral arteries: Subclavian atherosclerosis without flow limiting stenosis. Codominant vertebral arteries. Extrinsic narrowing of the vertebral arteries from uncovertebral spurs, especially on the right. No intrinsic stenosis. Skeleton: Generalized cervical spine degeneration. Generalized osteopenia. Other neck: Negative Upper chest: Clear apical lungs Review of the MIP images confirms the above findings CTA HEAD FINDINGS Anterior circulation: Right ICA reconstitution primarily at the ophthalmic and supraclinoid segment. Symmetric enhancement of MCA branches but hypoplastic right A1 segment and small right posterior communicating artery. No downstream embolus is seen. Posterior circulation: The vertebral and basilar arteries are smoothly contoured and widely patent. Atheromatous plaque on the vertebral arteries. No branch occlusion, beading, or aneurysm. Venous sinuses: Negative Anatomic variants: As above Review of the MIP images confirms  the above findings Critical Value/emergent results were called by telephone at the time of interpretation on 06/13/2021 at 8:58 am to provider Dr Eulis Foster , who verbally acknowledged these results. IMPRESSION: 1. 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 There is intracranial reconstitution but nearly isolated right MCA given hypoplastic right A1 segment and a small right posterior communicating artery. 2. 50% atheromatous narrowing at the left ICA bulb. 3. Moderate right V2 segment narrowing due to extrinsic stenosis from endplate spur. Electronically Signed   By: Jorje Guild M.D.   On: 06/13/2021 08:59   CT Angio Neck W and/or Wo Contrast  Result Date: 06/13/2021 CLINICAL DATA:  Headache with right-sided vision loss. Numbness to the right side of face and arm for several weeks EXAM: CT ANGIOGRAPHY HEAD AND NECK TECHNIQUE: Multidetector CT imaging of the head and neck  was performed using the standard protocol during bolus administration of intravenous contrast. Multiplanar CT image reconstructions and MIPs were obtained to evaluate the vascular anatomy. Carotid stenosis measurements (when applicable) are obtained utilizing NASCET criteria, using the distal internal carotid diameter as the denominator. CONTRAST:  12mL OMNIPAQUE IOHEXOL 350 MG/ML SOLN COMPARISON:  Brain MRI 08/11/2016 FINDINGS: CT HEAD FINDINGS Brain: No evidence of acute infarction, hemorrhage, hydrocephalus, extra-axial collection or mass lesion/mass effect. Vascular: No hyperdense vessel or unexpected calcification. Skull: Normal. Negative for fracture or focal lesion. Sinuses: Imaged portions are clear. Orbits: No acute finding. Review of the MIP images confirms the above findings CTA NECK FINDINGS Aortic arch: Atheromatous plaque. Minimal coverage. Likely 2 vessel branching. Right carotid system: Irregular low-density plaque with ulceration at the bifurcation/bulb and with proximal ICA occlusion and no detectable opacification of the downstream vessel until intracranial reconstitution. Flow void was seen at this level on prior. Left carotid system: Atheromatous wall thickening with mixed density plaque at the bulb/bifurcation causing 50% stenosis. Vertebral arteries: Subclavian atherosclerosis without flow limiting stenosis. Codominant vertebral arteries. Extrinsic narrowing of the vertebral arteries from uncovertebral spurs, especially on the right. No intrinsic stenosis. Skeleton: Generalized cervical spine degeneration. Generalized osteopenia. Other neck: Negative Upper chest: Clear apical lungs Review of the MIP images confirms the above findings CTA HEAD FINDINGS Anterior circulation: Right ICA reconstitution primarily at the ophthalmic and supraclinoid segment. Symmetric enhancement of MCA branches but hypoplastic right A1 segment and small right posterior communicating artery. No downstream embolus is  seen. Posterior circulation: The vertebral and basilar arteries are smoothly contoured and widely patent. Atheromatous plaque on the vertebral arteries. No branch occlusion, beading, or aneurysm. Venous sinuses: Negative Anatomic variants: As above Review of the MIP images confirms the above findings Critical Value/emergent results were called by telephone at the time of interpretation on 06/13/2021 at 8:58 am to provider Dr Eulis Foster , who verbally acknowledged these results. IMPRESSION: 1. 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 There is intracranial reconstitution but nearly isolated right MCA given hypoplastic right A1 segment and a small right posterior communicating artery. 2. 50% atheromatous narrowing at the left ICA bulb. 3. Moderate right V2 segment narrowing due to extrinsic stenosis from endplate spur. Electronically Signed   By: Jorje Guild M.D.   On: 06/13/2021 08:59   MR ANGIO HEAD WO CONTRAST  Result Date: 06/13/2021 CLINICAL DATA:  Stroke, follow-up.  Right-sided vision loss. EXAM: MRI HEAD WITHOUT CONTRAST MRA HEAD WITHOUT CONTRAST MRA OF THE NECK WITHOUT AND WITH CONTRAST TECHNIQUE: Multiplanar, multi-echo pulse sequences of the brain and surrounding structures were acquired without intravenous contrast. Angiographic images of  the Circle of Willis were acquired using MRA technique without intravenous contrast. Angiographic images of the neck were acquired using MRA technique without and with intravenous contrast. Carotid stenosis measurements (when applicable) are obtained utilizing NASCET criteria, using the distal internal carotid diameter as the denominator. CONTRAST:  28mL GADAVIST GADOBUTROL 1 MMOL/ML IV SOLN COMPARISON:  Head and neck CTA 06/13/2021 FINDINGS: MR HEAD FINDINGS Some sequences are mildly to moderately motion degraded. Brain: There is a small acute infarct involving the right caudate body, and there is question of a punctate acute subcortical white  matter infarct in the posterior right frontal lobe. T2 hyperintensities in the cerebral white matter nonspecific but compatible with minimal chronic small vessel ischemic disease, not advanced for age. Mild cerebral atrophy is within normal limits for age. There are chronic lacunar infarcts in the left lentiform nucleus and left cerebellar hemisphere. Vascular: Known right ICA occlusion. Skull and upper cervical spine: Unremarkable bone marrow signal. Sinuses/Orbits: Unremarkable orbits. Paranasal sinuses and mastoid air cells are clear. Other: None. MRA HEAD FINDINGS Anterior circulation: The intracranial right ICA is occluded proximally with reconstitution of the supraclinoid segment and terminus. The intracranial left ICA is widely patent. ACAs and MCAs are patent without evidence of a proximal branch occlusion or significant M1 or left A1 stenosis. The right A1 segment is hypoplastic. No aneurysm is identified. Posterior circulation: The intracranial vertebral arteries are widely patent to the basilar. The basilar artery is widely patent. There is a patent right posterior communicating artery. Both PCAs are patent without evidence of a significant proximal stenosis. No aneurysm is identified. Anatomic variants:None. MRA NECK FINDINGS Aortic arch: Common origin of the brachiocephalic and left common carotid arteries. Widely patent arch vessel origins. Signal loss in the proximal right subclavian artery, likely artifactual due to vessel tortuosity and motion. Right carotid system: Patent common carotid artery. Unchanged proximal occlusion of the ICA without reconstitution in the neck. Left carotid system: Signal loss in the proximal common carotid artery, likely artifactual due to motion given normal appearance on the earlier CTA. Wide patency of the more distal common carotid artery. 50% stenosis of the ICA origin as shown on CTA. Vertebral arteries: The vertebral arteries are patent and codominant with antegrade  flow bilaterally and without evidence of a significant stenosis on the left. There is moderate proximal right V2 stenosis which is due to extrinsic compression from uncovertebral spurring based on CTA. IMPRESSION: 1. Small acute infarct in the right caudate body. 2. Questionable punctate acute white matter infarct in the posterior right frontal lobe. 3. 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 4. 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 5. 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 Electronically Signed   By: Logan Bores M.D.   On: 06/13/2021 17:41   MR ANGIO NECK W WO CONTRAST  Result Date: 06/13/2021 CLINICAL DATA:  Stroke, follow-up.  Right-sided vision loss. EXAM: MRI HEAD WITHOUT CONTRAST MRA HEAD WITHOUT CONTRAST MRA OF THE NECK WITHOUT AND WITH CONTRAST TECHNIQUE: Multiplanar, multi-echo pulse sequences of the brain and surrounding structures were acquired without intravenous contrast. Angiographic images of the Circle of Willis were acquired using MRA technique without intravenous contrast. Angiographic images of the neck were acquired using MRA technique without and with intravenous contrast. Carotid stenosis measurements (when applicable) are obtained utilizing NASCET criteria, using the distal internal carotid diameter as the denominator. CONTRAST:  24mL GADAVIST GADOBUTROL 1 MMOL/ML IV SOLN COMPARISON:  Head and neck CTA 06/13/2021 FINDINGS:  MR HEAD FINDINGS Some sequences are mildly to moderately motion degraded. Brain: There is a small acute infarct involving the right caudate body, and there is question of a punctate acute subcortical white matter infarct in the posterior right frontal lobe. T2 hyperintensities in the cerebral white matter nonspecific but compatible with minimal chronic small vessel ischemic disease, not advanced for age. Mild cerebral atrophy is within normal limits for age. There are chronic lacunar infarcts in the  left lentiform nucleus and left cerebellar hemisphere. Vascular: Known right ICA occlusion. Skull and upper cervical spine: Unremarkable bone marrow signal. Sinuses/Orbits: Unremarkable orbits. Paranasal sinuses and mastoid air cells are clear. Other: None. MRA HEAD FINDINGS Anterior circulation: The intracranial right ICA is occluded proximally with reconstitution of the supraclinoid segment and terminus. The intracranial left ICA is widely patent. ACAs and MCAs are patent without evidence of a proximal branch occlusion or significant M1 or left A1 stenosis. The right A1 segment is hypoplastic. No aneurysm is identified. Posterior circulation: The intracranial vertebral arteries are widely patent to the basilar. The basilar artery is widely patent. There is a patent right posterior communicating artery. Both PCAs are patent without evidence of a significant proximal stenosis. No aneurysm is identified. Anatomic variants:None. MRA NECK FINDINGS Aortic arch: Common origin of the brachiocephalic and left common carotid arteries. Widely patent arch vessel origins. Signal loss in the proximal right subclavian artery, likely artifactual due to vessel tortuosity and motion. Right carotid system: Patent common carotid artery. Unchanged proximal occlusion of the ICA without reconstitution in the neck. Left carotid system: Signal loss in the proximal common carotid artery, likely artifactual due to motion given normal appearance on the earlier CTA. Wide patency of the more distal common carotid artery. 50% stenosis of the ICA origin as shown on CTA. Vertebral arteries: The vertebral arteries are patent and codominant with antegrade flow bilaterally and without evidence of a significant stenosis on the left. There is moderate proximal right V2 stenosis which is due to extrinsic compression from uncovertebral spurring based on CTA. IMPRESSION: 1. Small acute infarct in the right caudate body. 2. Questionable punctate acute  white matter infarct in the posterior right frontal lobe. 3. 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 4. 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 5. 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 Electronically Signed   By: Logan Bores M.D.   On: 06/13/2021 17:41   MR BRAIN WO CONTRAST  Result Date: 06/13/2021 CLINICAL DATA:  Stroke, follow-up.  Right-sided vision loss. EXAM: MRI HEAD WITHOUT CONTRAST MRA HEAD WITHOUT CONTRAST MRA OF THE NECK WITHOUT AND WITH CONTRAST TECHNIQUE: Multiplanar, multi-echo pulse sequences of the brain and surrounding structures were acquired without intravenous contrast. Angiographic images of the Circle of Willis were acquired using MRA technique without intravenous contrast. Angiographic images of the neck were acquired using MRA technique without and with intravenous contrast. Carotid stenosis measurements (when applicable) are obtained utilizing NASCET criteria, using the distal internal carotid diameter as the denominator. CONTRAST:  8mL GADAVIST GADOBUTROL 1 MMOL/ML IV SOLN COMPARISON:  Head and neck CTA 06/13/2021 FINDINGS: MR HEAD FINDINGS Some sequences are mildly to moderately motion degraded. Brain: There is a small acute infarct involving the right caudate body, and there is question of a punctate acute subcortical white matter infarct in the posterior right frontal lobe. T2 hyperintensities in the cerebral white matter nonspecific but compatible with minimal chronic small vessel ischemic disease, not advanced for age. Mild cerebral atrophy  is within normal limits for age. There are chronic lacunar infarcts in the left lentiform nucleus and left cerebellar hemisphere. Vascular: Known right ICA occlusion. Skull and upper cervical spine: Unremarkable bone marrow signal. Sinuses/Orbits: Unremarkable orbits. Paranasal sinuses and mastoid air cells are clear. Other: None. MRA HEAD FINDINGS Anterior circulation: The  intracranial right ICA is occluded proximally with reconstitution of the supraclinoid segment and terminus. The intracranial left ICA is widely patent. ACAs and MCAs are patent without evidence of a proximal branch occlusion or significant M1 or left A1 stenosis. The right A1 segment is hypoplastic. No aneurysm is identified. Posterior circulation: The intracranial vertebral arteries are widely patent to the basilar. The basilar artery is widely patent. There is a patent right posterior communicating artery. Both PCAs are patent without evidence of a significant proximal stenosis. No aneurysm is identified. Anatomic variants:None. MRA NECK FINDINGS Aortic arch: Common origin of the brachiocephalic and left common carotid arteries. Widely patent arch vessel origins. Signal loss in the proximal right subclavian artery, likely artifactual due to vessel tortuosity and motion. Right carotid system: Patent common carotid artery. Unchanged proximal occlusion of the ICA without reconstitution in the neck. Left carotid system: Signal loss in the proximal common carotid artery, likely artifactual due to motion given normal appearance on the earlier CTA. Wide patency of the more distal common carotid artery. 50% stenosis of the ICA origin as shown on CTA. Vertebral arteries: The vertebral arteries are patent and codominant with antegrade flow bilaterally and without evidence of a significant stenosis on the left. There is moderate proximal right V2 stenosis which is due to extrinsic compression from uncovertebral spurring based on CTA. IMPRESSION: 1. Small acute infarct in the right caudate body. 2. Questionable punctate acute white matter infarct in the posterior right frontal lobe. 3. 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 4. 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 5. 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 Electronically Signed   By: Sebastian Ache M.D.   On:  06/13/2021 17:41   ECHOCARDIOGRAM COMPLETE  Result Date: 06/13/2021    ECHOCARDIOGRAM REPORT   Patient Name:   AVERETT MAGGIO Date of Exam: 06/13/2021 Medical Rec #:  [id number]         Height:       71.0 in Accession #:    [id number]        Weight:       255.0 lb Date of Birth:  1945-07-25        BSA:          2.338 m Patient Age:    75 years          BP:           149/91 mmHg Patient Gender: M                 HR:           83 bpm. [facility]am [facility] Procedure: 2D Echo, Cardiac Doppler and Color Doppler Indications:    Stroke  History:        Patient has no prior history of Echocardiogram examinations.                 Abnormal ECG, Stroke; Risk Factors:Hypertension.  Sonographer:    Mikki Harbor Referring Phys: [id number] COURAGE EMOKPAE IMPRESSIONS  1. Left ventricular ejection fraction, by estimation, is 55 to 60%. The left ventricle has normal function. The left ventricle has no regional  wall motion abnormalities. Left ventricular diastolic parameters are indeterminate.  2. Right ventricular systolic function is normal. The right ventricular size is normal. There is normal pulmonary artery systolic pressure. The estimated right ventricular systolic pressure is 0000000 mmHg.  3. Left atrial size was mildly dilated.  4. The mitral valve is grossly normal. Trivial mitral valve regurgitation. No evidence of mitral stenosis.  5. The aortic valve is tricuspid. Aortic valve regurgitation is not visualized. No aortic stenosis is present.  6. The inferior vena cava is normal in size with greater than 50% respiratory variability, suggesting right atrial pressure of 3 mmHg. Conclusion(s)/Recommendation(s): No intracardiac source of embolism detected on this transthoracic study. Consider a transesophageal echocardiogram to exclude cardiac source of embolism if clinically indicated. FINDINGS  Left Ventricle: Left ventricular ejection fraction, by estimation, is 55 to 60%. The left ventricle has normal function.  The left ventricle has no regional wall motion abnormalities. The left ventricular internal cavity size was normal in size. There is  no left ventricular hypertrophy. Left ventricular diastolic parameters are indeterminate. Right Ventricle: The right ventricular size is normal. No increase in right ventricular wall thickness. Right ventricular systolic function is normal. There is normal pulmonary artery systolic pressure. The tricuspid regurgitant velocity is 2.54 m/s, and  with an assumed right atrial pressure of 3 mmHg, the estimated right ventricular systolic pressure is 0000000 mmHg. Left Atrium: Left atrial size was mildly dilated. Right Atrium: Right atrial size was normal in size. Pericardium: Trivial pericardial effusion is present. Presence of epicardial fat layer. Mitral Valve: The mitral valve is grossly normal. Trivial mitral valve regurgitation. No evidence of mitral valve stenosis. MV peak gradient, 4.8 mmHg. The mean mitral valve gradient is 2.0 mmHg. Tricuspid Valve: The tricuspid valve is grossly normal. Tricuspid valve regurgitation is trivial. No evidence of tricuspid stenosis. Aortic Valve: The aortic valve is tricuspid. Aortic valve regurgitation is not visualized. No aortic stenosis is present. Aortic valve mean gradient measures 2.7 mmHg. Aortic valve peak gradient measures 5.7 mmHg. Aortic valve area, by VTI measures 2.78 cm. Pulmonic Valve: The pulmonic valve was grossly normal. Pulmonic valve regurgitation is trivial. No evidence of pulmonic stenosis. Aorta: The aortic root and ascending aorta are structurally normal, with no evidence of dilitation. Venous: The inferior vena cava is normal in size with greater than 50% respiratory variability, suggesting right atrial pressure of 3 mmHg. IAS/Shunts: The atrial septum is grossly normal.  LEFT VENTRICLE PLAX 2D LVIDd:         5.10 cm     Diastology LVIDs:         3.50 cm     LV e' medial:    5.98 cm/s LV PW:         1.20 cm     LV E/e' medial:   16.5 LV IVS:        1.20 cm     LV e' lateral:   10.80 cm/s LVOT diam:     2.00 cm     LV E/e' lateral: 9.1 LV SV:         70 LV SV Index:   30 LVOT Area:     3.14 cm  LV Volumes (MOD) LV vol d, MOD A2C: 56.2 ml LV vol d, MOD A4C: 76.6 ml LV vol s, MOD A2C: 29.9 ml LV vol s, MOD A4C: 26.7 ml LV SV MOD A2C:     26.3 ml LV SV MOD A4C:     76.6 ml LV SV MOD  BP:      39.2 ml RIGHT VENTRICLE RV Basal diam:  3.80 cm RV Mid diam:    3.10 cm RV S prime:     12.20 cm/s TAPSE (M-mode): 3.4 cm LEFT ATRIUM             Index        RIGHT ATRIUM           Index LA diam:        5.10 cm 2.18 cm/m   RA Area:     19.00 cm LA Vol (A2C):   79.0 ml 33.79 ml/m  RA Volume:   53.40 ml  22.84 ml/m LA Vol (A4C):   98.5 ml 42.13 ml/m LA Biplane Vol: 92.7 ml 39.65 ml/m  AORTIC VALVE                    PULMONIC VALVE AV Area (Vmax):    2.30 cm     PV Vmax:       0.90 m/s AV Area (Vmean):   2.47 cm     PV Peak grad:  3.2 mmHg AV Area (VTI):     2.78 cm AV Vmax:           119.33 cm/s AV Vmean:          76.200 cm/s AV VTI:            0.252 m AV Peak Grad:      5.7 mmHg AV Mean Grad:      2.7 mmHg LVOT Vmax:         87.50 cm/s LVOT Vmean:        59.900 cm/s LVOT VTI:          0.223 m LVOT/AV VTI ratio: 0.88  AORTA Ao Root diam: 3.30 cm Ao Asc diam:  3.00 cm MITRAL VALVE               TRICUSPID VALVE MV Area (PHT): 3.31 cm    TR Peak grad:   25.8 mmHg MV Area VTI:   1.90 cm    TR Vmax:        254.00 cm/s MV Peak grad:  4.8 mmHg MV Mean grad:  2.0 mmHg    SHUNTS MV Vmax:       1.09 m/s    Systemic VTI:  0.22 m MV Vmean:      59.9 cm/s   Systemic Diam: 2.00 cm MV Decel Time: 229 msec MV E velocity: 98.50 cm/s MV A velocity: 90.00 cm/s MV E/A ratio:  1.09 Eleonore Chiquito MD Electronically signed by Eleonore Chiquito MD Signature Date/Time: 06/13/2021/6:27:28 PM    Final        LOS: 0 days   Bonnielee Haff  [facility] Pager on [email or web address]  06/14/2021, 11:55 AM

## 2021-06-15 DIAGNOSIS — I63233 Cerebral infarction due to unspecified occlusion or stenosis of bilateral carotid arteries: Secondary | ICD-10-CM | POA: Diagnosis present

## 2021-06-15 DIAGNOSIS — I129 Hypertensive chronic kidney disease with stage 1 through stage 4 chronic kidney disease, or unspecified chronic kidney disease: Secondary | ICD-10-CM | POA: Diagnosis present

## 2021-06-15 DIAGNOSIS — I639 Cerebral infarction, unspecified: Secondary | ICD-10-CM | POA: Diagnosis not present

## 2021-06-15 DIAGNOSIS — I7771 Dissection of carotid artery: Secondary | ICD-10-CM | POA: Diagnosis present

## 2021-06-15 DIAGNOSIS — Z800 Family history of malignant neoplasm of digestive organs: Secondary | ICD-10-CM | POA: Diagnosis not present

## 2021-06-15 DIAGNOSIS — I6522 Occlusion and stenosis of left carotid artery: Secondary | ICD-10-CM | POA: Diagnosis not present

## 2021-06-15 DIAGNOSIS — Z8249 Family history of ischemic heart disease and other diseases of the circulatory system: Secondary | ICD-10-CM | POA: Diagnosis not present

## 2021-06-15 DIAGNOSIS — I63231 Cerebral infarction due to unspecified occlusion or stenosis of right carotid arteries: Secondary | ICD-10-CM | POA: Diagnosis not present

## 2021-06-15 DIAGNOSIS — E669 Obesity, unspecified: Secondary | ICD-10-CM | POA: Diagnosis present

## 2021-06-15 DIAGNOSIS — Y92410 Unspecified street and highway as the place of occurrence of the external cause: Secondary | ICD-10-CM | POA: Diagnosis not present

## 2021-06-15 DIAGNOSIS — I6521 Occlusion and stenosis of right carotid artery: Secondary | ICD-10-CM

## 2021-06-15 DIAGNOSIS — Z20822 Contact with and (suspected) exposure to covid-19: Secondary | ICD-10-CM | POA: Diagnosis present

## 2021-06-15 DIAGNOSIS — Z7982 Long term (current) use of aspirin: Secondary | ICD-10-CM | POA: Diagnosis not present

## 2021-06-15 DIAGNOSIS — I471 Supraventricular tachycardia: Secondary | ICD-10-CM | POA: Diagnosis present

## 2021-06-15 DIAGNOSIS — E785 Hyperlipidemia, unspecified: Secondary | ICD-10-CM | POA: Diagnosis present

## 2021-06-15 DIAGNOSIS — G453 Amaurosis fugax: Secondary | ICD-10-CM | POA: Diagnosis present

## 2021-06-15 DIAGNOSIS — D72829 Elevated white blood cell count, unspecified: Secondary | ICD-10-CM | POA: Diagnosis not present

## 2021-06-15 DIAGNOSIS — I493 Ventricular premature depolarization: Secondary | ICD-10-CM | POA: Diagnosis present

## 2021-06-15 DIAGNOSIS — H5461 Unqualified visual loss, right eye, normal vision left eye: Secondary | ICD-10-CM | POA: Diagnosis present

## 2021-06-15 DIAGNOSIS — N182 Chronic kidney disease, stage 2 (mild): Secondary | ICD-10-CM | POA: Diagnosis present

## 2021-06-15 DIAGNOSIS — Z6835 Body mass index (BMI) 35.0-35.9, adult: Secondary | ICD-10-CM | POA: Diagnosis not present

## 2021-06-15 DIAGNOSIS — Z7902 Long term (current) use of antithrombotics/antiplatelets: Secondary | ICD-10-CM | POA: Diagnosis not present

## 2021-06-15 DIAGNOSIS — R29703 NIHSS score 3: Secondary | ICD-10-CM | POA: Diagnosis present

## 2021-06-15 DIAGNOSIS — Z87891 Personal history of nicotine dependence: Secondary | ICD-10-CM | POA: Diagnosis not present

## 2021-06-15 DIAGNOSIS — H3411 Central retinal artery occlusion, right eye: Secondary | ICD-10-CM | POA: Diagnosis present

## 2021-06-15 LAB — MAGNESIUM: Magnesium: 2 mg/dL (ref 1.7–2.4)

## 2021-06-15 MED ORDER — ALPRAZOLAM 0.5 MG PO TABS
0.5000 mg | ORAL_TABLET | Freq: Three times a day (TID) | ORAL | Status: DC | PRN
  Administered 2021-06-15 – 2021-06-16 (×3): 0.5 mg via ORAL
  Filled 2021-06-15 (×3): qty 1
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
## 2021-06-15 NOTE — Progress Notes (Signed)
Pt reports feeling hot and "hard to catch my breath" and is frustrated with sleep study equipment stating it is too tight and pulling at equipment. Denies chest pain/pressure or dizziness. Resps unlabored. RN adjusted equipment as much as possible but pt still frustrated and requests to remove it - sleep study staff notified and equipment removed.  Pt reports no longer feeling hot and breathing better but feels anxious. MD notified. EKG done due to ventricular bigeminy per order. Ativan given per order and pt able to sleep; denies feeling hot or SOB the rest of the night.

## 2021-06-15 NOTE — Progress Notes (Signed)
[facility] PROGRESS NOTE   Stephen Hull [id number] DOB: [birth year] DOA: 06/13/2021  PCP: Aletha Halim., PA-C  Brief History/Interval Summary: Stephen Hull  is a 75 y.o. male who is a reformed smoker (quit in 1991) with past medical history relevant for HTN, HLD, PSVT as well as history of chronic asymptomatic bradycardia and history of left carotid artery stenosis presented to the ED with sudden onset of right eye vision loss and headache starting around 4:30 AM on day of admission. He reports intermittent loss of vision of the inferior visual field of the right eye for the past 2 days. Lately patient has been having intermittent right facial and right upper extremity numbness and tingling and he was undergoing work-up at the [facility] and with his PCP at [facility] discussed case with on-call neurologist recommended transfer to [facility] for further neurology work-up and MRI. In ED ocular ultrasound was done by EDP see documentation in the EDP (Dr Wende Bushy)  notes  Patient was transferred to [facility] for further work-up  Reason for Visit: Acute stroke  Consultants: Neurology  Procedures:  Transthoracic echocardiogram  Cerebral angiogram 11/25    Subjective/Interval History: Patient had a very restless night due to the sleep study apparatus that he had to wear overnight as part of a research study.  Feeling very anxious this morning.  Denies any chest pain shortness of breath.  No changes to the right eye vision.  Denies any new complaints otherwise.      Assessment/Plan:  Acute vision loss/acute stroke Patient has undergone MRI brain, MRA head and neck, transthoracic echocardiogram.  Echocardiogram shows normal systolic function.  No embolic source identified. 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 Patient was initially placed on aspirin and Plavix.  Subsequently loaded with Brilinta. Underwent cerebral angiogram  which showed severe stenosis of proximal right ICA with antegrade flow to the terminal right ICA.  Questionable focal dissection just distal to the above.  Patent distal right ICA intracranially.  Plan is for stenting of the ICA on Monday. LDL is 85.  Patient is on statin. HbA1c is 5.4.  TSH 3.2.  Urine drug screen unremarkable. PT and OT evaluation.  Essential hypertension Monitor blood pressures closely.  Allowing some degree of permissive hypertension.  Recheck labs tomorrow.  History of PSVT Stable currently.  Continue his beta-blocker.  EKG shows ventricular bigeminy.  Asymptomatic.  Obesity Estimated body mass index is 35.57 kg/m as calculated from the following:   Height as of this encounter: 5\' 11"  (1.803 m).   Weight as of this encounter: 115.7 kg.    DVT Prophylaxis: Heparin subcutaneous Code Status: Full code Family Communication: Discussed with the patient Disposition Plan: Needs to stay till stenting is performed on Monday.  Status is: Observation  The patient remains OBS appropriate and will most likely d/c before 2 midnights.     Medications: Scheduled:  acebutolol  200 mg Oral BID   aspirin EC  81 mg Oral Q breakfast   atorvastatin  40 mg Oral Once   atorvastatin  80 mg Oral Daily   cholecalciferol  2,000 Units Oral Daily   heparin  5,000 Units Subcutaneous Q8H   pantoprazole  40 mg Oral Daily   sodium chloride flush  3 mL Intravenous Q12H   sodium chloride flush  3 mL Intravenous Q12H   ticagrelor  90 mg Oral BID   Continuous:  sodium chloride     [id number]  chloride, acetaminophen **OR** acetaminophen, ALPRAZolam, bisacodyl, hydrALAZINE, ondansetron **OR** ondansetron (ZOFRAN) IV, polyethylene glycol, sodium chloride flush, traZODone  Antibiotics: Anti-infectives (From admission, onward)    None       Objective:  Vital Signs  Vitals:   06/14/21 2100 06/14/21 2258 06/15/21 0607 06/15/21 0931  BP: (!) 153/96 (!) 166/98 138/89 (!) 153/97   Pulse: 84   85  Resp: (!) 22 15 18 16   Temp: 97.7 F (36.5 C)  97.8 F (36.6 C) 97.8 F (36.6 C)  TempSrc: Oral  Oral Oral  SpO2: 94% 98% 98% 97%  Weight:      Height:       No intake or output data in the 24 hours ending 06/15/21 0940  Filed Weights   06/13/21 0647  Weight: 115.7 kg    General appearance: Awake alert.  In no distress Resp: Clear to auscultation bilaterally.  Normal effort Cardio: S1-S2 is normal regular.  No S3-S4.  No rubs murmurs or bruit GI: Abdomen is soft.  Nontender nondistended.  Bowel sounds are present normal.  No masses organomegaly Extremities: No edema.  Full range of motion of lower extremities. Neurologic: Alert and oriented x3.  No focal neurological deficits.     Lab Results:  Data Reviewed: I have personally reviewed following labs and imaging studies  CBC: Recent Labs  Lab 06/13/21 0725 06/13/21 0731 06/14/21 0219  WBC 11.6*  --  12.7*  NEUTROABS 7.9*  --   --   HGB 15.0 16.0 14.4  HCT 45.9 47.0 44.6  MCV 92.7  --  92.1  PLT 192  --  188     Basic Metabolic Panel: Recent Labs  Lab 06/13/21 0725 06/13/21 0731 06/14/21 0219 06/14/21 2347  NA 139 142 138  --   K 3.9 4.1 3.9  --   CL 106 104 106  --   CO2 25  --  26  --   GLUCOSE 106* 105* 103*  --   BUN 23 23 19   --   CREATININE 1.25* 1.20 1.33*  --   CALCIUM 8.7*  --  8.8*  --   MG  --   --  1.9 2.0     GFR: Estimated Creatinine Clearance: 64 mL/min (A) (by C-G formula based on SCr of 1.33 mg/dL (H)).  Liver Function Tests: Recent Labs  Lab 06/13/21 0725  AST 23  ALT 27  ALKPHOS 61  BILITOT 1.0  PROT 6.7  ALBUMIN 3.8      Coagulation Profile: Recent Labs  Lab 06/13/21 0725  INR 1.0      HbA1C: Recent Labs    06/13/21 0725  HGBA1C 5.4      Lipid Profile: Recent Labs    06/14/21 0219  CHOL 155  HDL 38*  LDLCALC 85  TRIG 06/15/21*  CHOLHDL 4.1     Thyroid Function Tests: Recent Labs    06/14/21 0219  TSH 3.297       Recent Results (from the past 240 hour(s))  Resp Panel by RT-PCR (Flu A&B, Covid) Nasopharyngeal Swab     Status: None   Collection Time: 06/13/21  7:42 AM   Specimen: Nasopharyngeal Swab; Nasopharyngeal(NP) swabs in vial transport medium  Result Value Ref Range Status   SARS Coronavirus 2 by RT PCR NEGATIVE NEGATIVE Final    Comment: (NOTE) SARS-CoV-2 target nucleic acids are NOT DETECTED.  The SARS-CoV-2 RNA is generally detectable in upper respiratory specimens during the acute phase of infection. The lowest concentration of  SARS-CoV-2 viral copies this assay can detect is 138 copies/mL. A negative result does not preclude SARS-Cov-2 infection and should not be used as the sole basis for treatment or other patient management decisions. A negative result may occur with  improper specimen collection/handling, submission of specimen other than nasopharyngeal swab, presence of viral mutation(s) within the areas targeted by this assay, and inadequate number of viral copies(<138 copies/mL). A negative result must be combined with clinical observations, patient history, and epidemiological information. The expected result is Negative.  Fact Sheet for Patients:  EntrepreneurPulse.com.au  Fact Sheet for Healthcare Providers:  IncredibleEmployment.be  This test is no t yet approved or cleared by the [place] FDA and  has been authorized for detection and/or diagnosis of SARS-CoV-2 by FDA under an Emergency Use Authorization (EUA). This EUA will remain  in effect (meaning this test can be used) for the duration of the COVID-19 declaration under Section 564(b)(1) of the Act, 21 U.S.C.section 360bbb-3(b)(1), unless the authorization is terminated  or revoked sooner.       Influenza A by PCR NEGATIVE NEGATIVE Final   Influenza B by PCR NEGATIVE NEGATIVE Final    Comment: (NOTE) The Xpert Xpress SARS-CoV-2/FLU/RSV plus assay is intended  as an aid in the diagnosis of influenza from Nasopharyngeal swab specimens and should not be used as a sole basis for treatment. Nasal washings and aspirates are unacceptable for Xpert Xpress SARS-CoV-2/FLU/RSV testing.  Fact Sheet for Patients: EntrepreneurPulse.com.au  Fact Sheet for Healthcare Providers: IncredibleEmployment.be  This test is not yet approved or cleared by the [place] FDA and has been authorized for detection and/or diagnosis of SARS-CoV-2 by FDA under an Emergency Use Authorization (EUA). This EUA will remain in effect (meaning this test can be used) for the duration of the COVID-19 declaration under Section 564(b)(1) of the Act, 21 U.S.C. section 360bbb-3(b)(1), unless the authorization is terminated or revoked.  Performed at [facility], [address] [place],  [address]        Radiology Studies: MR ANGIO HEAD WO CONTRAST  Result Date: 06/13/2021 CLINICAL DATA:  Stroke, follow-up.  Right-sided vision loss. EXAM: MRI HEAD WITHOUT CONTRAST MRA HEAD WITHOUT CONTRAST MRA OF THE NECK WITHOUT AND WITH CONTRAST TECHNIQUE: Multiplanar, multi-echo pulse sequences of the brain and surrounding structures were acquired without intravenous contrast. Angiographic images of the Circle of Willis were acquired using MRA technique without intravenous contrast. Angiographic images of the neck were acquired using MRA technique without and with intravenous contrast. Carotid stenosis measurements (when applicable) are obtained utilizing NASCET criteria, using the distal internal carotid diameter as the denominator. CONTRAST:  74mL GADAVIST GADOBUTROL 1 MMOL/ML IV SOLN COMPARISON:  Head and neck CTA 06/13/2021 FINDINGS: MR HEAD FINDINGS Some sequences are mildly to moderately motion degraded. Brain: There is a small acute infarct involving the right caudate body, and there is question of a punctate acute subcortical white matter infarct  in the posterior right frontal lobe. T2 hyperintensities in the cerebral white matter nonspecific but compatible with minimal chronic small vessel ischemic disease, not advanced for age. Mild cerebral atrophy is within normal limits for age. There are chronic lacunar infarcts in the left lentiform nucleus and left cerebellar hemisphere. Vascular: Known right ICA occlusion. Skull and upper cervical spine: Unremarkable bone marrow signal. Sinuses/Orbits: Unremarkable orbits. Paranasal sinuses and mastoid air cells are clear. Other: None. MRA HEAD FINDINGS Anterior circulation: The intracranial right ICA is occluded proximally with reconstitution of the supraclinoid segment and terminus. The intracranial left  ICA is widely patent. ACAs and MCAs are patent without evidence of a proximal branch occlusion or significant M1 or left A1 stenosis. The right A1 segment is hypoplastic. No aneurysm is identified. Posterior circulation: The intracranial vertebral arteries are widely patent to the basilar. The basilar artery is widely patent. There is a patent right posterior communicating artery. Both PCAs are patent without evidence of a significant proximal stenosis. No aneurysm is identified. Anatomic variants:None. MRA NECK FINDINGS Aortic arch: Common origin of the brachiocephalic and left common carotid arteries. Widely patent arch vessel origins. Signal loss in the proximal right subclavian artery, likely artifactual due to vessel tortuosity and motion. Right carotid system: Patent common carotid artery. Unchanged proximal occlusion of the ICA without reconstitution in the neck. Left carotid system: Signal loss in the proximal common carotid artery, likely artifactual due to motion given normal appearance on the earlier CTA. Wide patency of the more distal common carotid artery. 50% stenosis of the ICA origin as shown on CTA. Vertebral arteries: The vertebral arteries are patent and codominant with antegrade flow  bilaterally and without evidence of a significant stenosis on the left. There is moderate proximal right V2 stenosis which is due to extrinsic compression from uncovertebral spurring based on CTA. IMPRESSION: 1. Small acute infarct in the right caudate body. 2. Questionable punctate acute white matter infarct in the posterior right frontal lobe. 3. 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 4. 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 5. 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 Electronically Signed   By: Logan Bores M.D.   On: 06/13/2021 17:41   MR ANGIO NECK W WO CONTRAST  Result Date: 06/13/2021 CLINICAL DATA:  Stroke, follow-up.  Right-sided vision loss. EXAM: MRI HEAD WITHOUT CONTRAST MRA HEAD WITHOUT CONTRAST MRA OF THE NECK WITHOUT AND WITH CONTRAST TECHNIQUE: Multiplanar, multi-echo pulse sequences of the brain and surrounding structures were acquired without intravenous contrast. Angiographic images of the Circle of Willis were acquired using MRA technique without intravenous contrast. Angiographic images of the neck were acquired using MRA technique without and with intravenous contrast. Carotid stenosis measurements (when applicable) are obtained utilizing NASCET criteria, using the distal internal carotid diameter as the denominator. CONTRAST:  20mL GADAVIST GADOBUTROL 1 MMOL/ML IV SOLN COMPARISON:  Head and neck CTA 06/13/2021 FINDINGS: MR HEAD FINDINGS Some sequences are mildly to moderately motion degraded. Brain: There is a small acute infarct involving the right caudate body, and there is question of a punctate acute subcortical white matter infarct in the posterior right frontal lobe. T2 hyperintensities in the cerebral white matter nonspecific but compatible with minimal chronic small vessel ischemic disease, not advanced for age. Mild cerebral atrophy is within normal limits for age. There are chronic lacunar infarcts in the left  lentiform nucleus and left cerebellar hemisphere. Vascular: Known right ICA occlusion. Skull and upper cervical spine: Unremarkable bone marrow signal. Sinuses/Orbits: Unremarkable orbits. Paranasal sinuses and mastoid air cells are clear. Other: None. MRA HEAD FINDINGS Anterior circulation: The intracranial right ICA is occluded proximally with reconstitution of the supraclinoid segment and terminus. The intracranial left ICA is widely patent. ACAs and MCAs are patent without evidence of a proximal branch occlusion or significant M1 or left A1 stenosis. The right A1 segment is hypoplastic. No aneurysm is identified. Posterior circulation: The intracranial vertebral arteries are widely patent to the basilar. The basilar artery is widely patent. There is a patent right posterior communicating artery. Both PCAs are patent  without evidence of a significant proximal stenosis. No aneurysm is identified. Anatomic variants:None. MRA NECK FINDINGS Aortic arch: Common origin of the brachiocephalic and left common carotid arteries. Widely patent arch vessel origins. Signal loss in the proximal right subclavian artery, likely artifactual due to vessel tortuosity and motion. Right carotid system: Patent common carotid artery. Unchanged proximal occlusion of the ICA without reconstitution in the neck. Left carotid system: Signal loss in the proximal common carotid artery, likely artifactual due to motion given normal appearance on the earlier CTA. Wide patency of the more distal common carotid artery. 50% stenosis of the ICA origin as shown on CTA. Vertebral arteries: The vertebral arteries are patent and codominant with antegrade flow bilaterally and without evidence of a significant stenosis on the left. There is moderate proximal right V2 stenosis which is due to extrinsic compression from uncovertebral spurring based on CTA. IMPRESSION: 1. Small acute infarct in the right caudate body. 2. Questionable punctate acute white  matter infarct in the posterior right frontal lobe. 3. 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 4. 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 5. 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 Electronically Signed   By: Logan Bores M.D.   On: 06/13/2021 17:41   MR BRAIN WO CONTRAST  Result Date: 06/13/2021 CLINICAL DATA:  Stroke, follow-up.  Right-sided vision loss. EXAM: MRI HEAD WITHOUT CONTRAST MRA HEAD WITHOUT CONTRAST MRA OF THE NECK WITHOUT AND WITH CONTRAST TECHNIQUE: Multiplanar, multi-echo pulse sequences of the brain and surrounding structures were acquired without intravenous contrast. Angiographic images of the Circle of Willis were acquired using MRA technique without intravenous contrast. Angiographic images of the neck were acquired using MRA technique without and with intravenous contrast. Carotid stenosis measurements (when applicable) are obtained utilizing NASCET criteria, using the distal internal carotid diameter as the denominator. CONTRAST:  43mL GADAVIST GADOBUTROL 1 MMOL/ML IV SOLN COMPARISON:  Head and neck CTA 06/13/2021 FINDINGS: MR HEAD FINDINGS Some sequences are mildly to moderately motion degraded. Brain: There is a small acute infarct involving the right caudate body, and there is question of a punctate acute subcortical white matter infarct in the posterior right frontal lobe. T2 hyperintensities in the cerebral white matter nonspecific but compatible with minimal chronic small vessel ischemic disease, not advanced for age. Mild cerebral atrophy is within normal limits for age. There are chronic lacunar infarcts in the left lentiform nucleus and left cerebellar hemisphere. Vascular: Known right ICA occlusion. Skull and upper cervical spine: Unremarkable bone marrow signal. Sinuses/Orbits: Unremarkable orbits. Paranasal sinuses and mastoid air cells are clear. Other: None. MRA HEAD FINDINGS Anterior circulation: The  intracranial right ICA is occluded proximally with reconstitution of the supraclinoid segment and terminus. The intracranial left ICA is widely patent. ACAs and MCAs are patent without evidence of a proximal branch occlusion or significant M1 or left A1 stenosis. The right A1 segment is hypoplastic. No aneurysm is identified. Posterior circulation: The intracranial vertebral arteries are widely patent to the basilar. The basilar artery is widely patent. There is a patent right posterior communicating artery. Both PCAs are patent without evidence of a significant proximal stenosis. No aneurysm is identified. Anatomic variants:None. MRA NECK FINDINGS Aortic arch: Common origin of the brachiocephalic and left common carotid arteries. Widely patent arch vessel origins. Signal loss in the proximal right subclavian artery, likely artifactual due to vessel tortuosity and motion. Right carotid system: Patent common carotid artery. Unchanged proximal occlusion of the ICA without reconstitution in  the neck. Left carotid system: Signal loss in the proximal common carotid artery, likely artifactual due to motion given normal appearance on the earlier CTA. Wide patency of the more distal common carotid artery. 50% stenosis of the ICA origin as shown on CTA. Vertebral arteries: The vertebral arteries are patent and codominant with antegrade flow bilaterally and without evidence of a significant stenosis on the left. There is moderate proximal right V2 stenosis which is due to extrinsic compression from uncovertebral spurring based on CTA. IMPRESSION: 1. Small acute infarct in the right caudate body. 2. Questionable punctate acute white matter infarct in the posterior right frontal lobe. 3. 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 4. 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 5. 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 Electronically Signed   By: Logan Bores M.D.   On:  06/13/2021 17:41   ECHOCARDIOGRAM COMPLETE  Result Date: 06/13/2021    ECHOCARDIOGRAM REPORT   Patient Name:   ELIAV JUMA Date of Exam: 06/13/2021 Medical Rec #:  [id number]         Height:       71.0 in Accession #:    [id number]        Weight:       255.0 lb Date of Birth:  04-13-1946        BSA:          2.338 m Patient Age:    47 years          BP:           149/91 mmHg Patient Gender: M                 HR:           83 bpm. Exam Location:  [facility] Procedure: 2D Echo, Cardiac Doppler and Color Doppler Indications:    Stroke  History:        Patient has no prior history of Echocardiogram examinations.                 Abnormal ECG, Stroke; Risk Factors:Hypertension.  Sonographer:    Wenda Low Referring Phys: [place]  1. Left ventricular ejection fraction, by estimation, is 55 to 60%. The left ventricle has normal function. The left ventricle has no regional wall motion abnormalities. Left ventricular diastolic parameters are indeterminate.  2. Right ventricular systolic function is normal. The right ventricular size is normal. There is normal pulmonary artery systolic pressure. The estimated right ventricular systolic pressure is 0000000 mmHg.  3. Left atrial size was mildly dilated.  4. The mitral valve is grossly normal. Trivial mitral valve regurgitation. No evidence of mitral stenosis.  5. The aortic valve is tricuspid. Aortic valve regurgitation is not visualized. No aortic stenosis is present.  6. The inferior vena cava is normal in size with greater than 50% respiratory variability, suggesting right atrial pressure of 3 mmHg. Conclusion(s)/Recommendation(s): No intracardiac source of embolism detected on this transthoracic study. Consider a transesophageal echocardiogram to exclude cardiac source of embolism if clinically indicated. FINDINGS  Left Ventricle: Left ventricular ejection fraction, by estimation, is 55 to 60%. The left ventricle has normal function.  The left ventricle has no regional wall motion abnormalities. The left ventricular internal cavity size was normal in size. There is  no left ventricular hypertrophy. Left ventricular diastolic parameters are indeterminate. Right Ventricle: The right ventricular size is normal. No increase in right ventricular wall thickness. Right ventricular  systolic function is normal. There is normal pulmonary artery systolic pressure. The tricuspid regurgitant velocity is 2.54 m/s, and  with an assumed right atrial pressure of 3 mmHg, the estimated right ventricular systolic pressure is 0000000 mmHg. Left Atrium: Left atrial size was mildly dilated. Right Atrium: Right atrial size was normal in size. Pericardium: Trivial pericardial effusion is present. Presence of epicardial fat layer. Mitral Valve: The mitral valve is grossly normal. Trivial mitral valve regurgitation. No evidence of mitral valve stenosis. MV peak gradient, 4.8 mmHg. The mean mitral valve gradient is 2.0 mmHg. Tricuspid Valve: The tricuspid valve is grossly normal. Tricuspid valve regurgitation is trivial. No evidence of tricuspid stenosis. Aortic Valve: The aortic valve is tricuspid. Aortic valve regurgitation is not visualized. No aortic stenosis is present. Aortic valve mean gradient measures 2.7 mmHg. Aortic valve peak gradient measures 5.7 mmHg. Aortic valve area, by VTI measures 2.78 cm. Pulmonic Valve: The pulmonic valve was grossly normal. Pulmonic valve regurgitation is trivial. No evidence of pulmonic stenosis. Aorta: The aortic root and ascending aorta are structurally normal, with no evidence of dilitation. Venous: The inferior vena cava is normal in size with greater than 50% respiratory variability, suggesting right atrial pressure of 3 mmHg. IAS/Shunts: The atrial septum is grossly normal.  LEFT VENTRICLE PLAX 2D LVIDd:         5.10 cm     Diastology LVIDs:         3.50 cm     LV e' medial:    5.98 cm/s LV PW:         1.20 cm     LV E/e' medial:   16.5 LV IVS:        1.20 cm     LV e' lateral:   10.80 cm/s LVOT diam:     2.00 cm     LV E/e' lateral: 9.1 LV SV:         70 LV SV Index:   30 LVOT Area:     3.14 cm  LV Volumes (MOD) LV vol d, MOD A2C: 56.2 ml LV vol d, MOD A4C: 76.6 ml LV vol s, MOD A2C: 29.9 ml LV vol s, MOD A4C: 26.7 ml LV SV MOD A2C:     26.3 ml LV SV MOD A4C:     76.6 ml LV SV MOD BP:      39.2 ml RIGHT VENTRICLE RV Basal diam:  3.80 cm RV Mid diam:    3.10 cm RV S prime:     12.20 cm/s TAPSE (M-mode): 3.4 cm LEFT ATRIUM             Index        RIGHT ATRIUM           Index LA diam:        5.10 cm 2.18 cm/m   RA Area:     19.00 cm LA Vol (A2C):   79.0 ml 33.79 ml/m  RA Volume:   53.40 ml  22.84 ml/m LA Vol (A4C):   98.5 ml 42.13 ml/m LA Biplane Vol: 92.7 ml 39.65 ml/m  AORTIC VALVE                    PULMONIC VALVE AV Area (Vmax):    2.30 cm     PV Vmax:       0.90 m/s AV Area (Vmean):   2.47 cm     PV Peak grad:  3.2 mmHg AV Area (VTI):  2.78 cm AV Vmax:           119.33 cm/s AV Vmean:          76.200 cm/s AV VTI:            0.252 m AV Peak Grad:      5.7 mmHg AV Mean Grad:      2.7 mmHg LVOT Vmax:         87.50 cm/s LVOT Vmean:        59.900 cm/s LVOT VTI:          0.223 m LVOT/AV VTI ratio: 0.88  AORTA Ao Root diam: 3.30 cm Ao Asc diam:  3.00 cm MITRAL VALVE               TRICUSPID VALVE MV Area (PHT): 3.31 cm    TR Peak grad:   25.8 mmHg MV Area VTI:   1.90 cm    TR Vmax:        254.00 cm/s MV Peak grad:  4.8 mmHg MV Mean grad:  2.0 mmHg    SHUNTS MV Vmax:       1.09 m/s    Systemic VTI:  0.22 m MV Vmean:      59.9 cm/s   Systemic Diam: 2.00 cm MV Decel Time: 229 msec MV E velocity: 98.50 cm/s MV A velocity: 90.00 cm/s MV E/A ratio:  1.09 Eleonore Chiquito MD Electronically signed by Eleonore Chiquito MD Signature Date/Time: 06/13/2021/6:27:28 PM    Final        LOS: 0 days   [facility] Pager on [email or web address]  06/15/2021, 9:40 AM

## 2021-06-16 DIAGNOSIS — I6522 Occlusion and stenosis of left carotid artery: Secondary | ICD-10-CM | POA: Diagnosis not present

## 2021-06-16 DIAGNOSIS — H5461 Unqualified visual loss, right eye, normal vision left eye: Secondary | ICD-10-CM | POA: Diagnosis not present

## 2021-06-16 DIAGNOSIS — E669 Obesity, unspecified: Secondary | ICD-10-CM

## 2021-06-16 DIAGNOSIS — I6521 Occlusion and stenosis of right carotid artery: Secondary | ICD-10-CM | POA: Diagnosis not present

## 2021-06-16 DIAGNOSIS — H3411 Central retinal artery occlusion, right eye: Secondary | ICD-10-CM

## 2021-06-16 DIAGNOSIS — I639 Cerebral infarction, unspecified: Secondary | ICD-10-CM | POA: Diagnosis not present

## 2021-06-16 LAB — BASIC METABOLIC PANEL
Anion gap: 8 (ref 5–15)
BUN: 19 mg/dL (ref 8–23)
CO2: 24 mmol/L (ref 22–32)
Calcium: 8.8 mg/dL — ABNORMAL LOW (ref 8.9–10.3)
Chloride: 106 mmol/L (ref 98–111)
Creatinine, Ser: 1.32 mg/dL — ABNORMAL HIGH (ref 0.61–1.24)
GFR, Estimated: 57 mL/min — ABNORMAL LOW (ref >60)
Glucose, Bld: 100 mg/dL — ABNORMAL HIGH (ref 70–99)
Potassium: 3.5 mmol/L (ref 3.5–5.1)
Sodium: 138 mmol/L (ref 135–145)

## 2021-06-16 LAB — CBC
HCT: 47.1 % (ref 39.0–52.0)
Hemoglobin: 15.6 g/dL (ref 13.0–17.0)
MCH: 30.2 pg (ref 26.0–34.0)
MCHC: 33.1 g/dL (ref 30.0–36.0)
MCV: 91.1 fL (ref 80.0–100.0)
Platelets: 193 10*3/uL (ref 150–400)
RBC: 5.17 MIL/uL (ref 4.22–5.81)
RDW: 14.5 % (ref 11.5–15.5)
WBC: 13 10*3/uL — ABNORMAL HIGH (ref 4.0–10.5)
nRBC: 0 % (ref 0.0–0.2)

## 2021-06-16 MED ORDER — POTASSIUM CHLORIDE CRYS ER 20 MEQ PO TBCR
40.0000 meq | EXTENDED_RELEASE_TABLET | Freq: Once | ORAL | Status: AC
  Administered 2021-06-16: 09:00:00 40 meq via ORAL
  Filled 2021-06-16: qty 2
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
## 2021-06-16 NOTE — Progress Notes (Addendum)
[facility] PROGRESS NOTE   CHUKWUEMEKA ARTOLA OYD:[id number] DOB: 12/06/1945 DOA: 06/13/2021  PCP: Richmond Campbell., PA-C  Brief History/Interval Summary: Stephen Hull  is a 75 y.o. male who is a reformed smoker (quit in 1991) with past medical history relevant for HTN, HLD, PSVT as well as history of chronic asymptomatic bradycardia and history of left carotid artery stenosis presented to the ED with sudden onset of right eye vision loss and headache starting around 4:30 AM on day of admission. He reports intermittent loss of vision of the inferior visual field of the right eye for the past 2 days. Lately patient has been having intermittent right facial and right upper extremity numbness and tingling and he was undergoing work-up at the [facility] and with his PCP at [facility] EDP discussed case with on-call neurologist recommended transfer to [facility] for further neurology work-up and MRI. In ED ocular ultrasound was done by EDP see documentation in the EDP (Dr Acey Lav)  notes  Patient was transferred to [facility] for further work-up  Reason for Visit: Acute stroke  Consultants: Neurology  Procedures:  Transthoracic echocardiogram  Cerebral angiogram 11/25    Subjective/Interval History: Patient had a better night last night.  Slept well.  Denies any complaints this morning.  Still no vision in his right eye.      Assessment/Plan:  Acute vision loss/acute stroke Patient has undergone MRI brain, MRA head and neck, transthoracic echocardiogram.  Echocardiogram shows normal systolic function.  No embolic source identified. 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 Patient was initially placed on aspirin and Plavix.  Subsequently loaded with Brilinta.  Plavix was discontinued. Underwent cerebral angiogram which showed severe stenosis of proximal right ICA with antegrade flow to the terminal right ICA.  Questionable focal dissection just  distal to the above.  Patent distal right ICA intracranially.  Plan is for stenting of the ICA on Monday. LDL is 85.  Patient is on statin. HbA1c is 5.4.  TSH 3.2.  Urine drug screen unremarkable. PT and OT evaluation.  Leukocytosis was likely reactive.  He is afebrile.  Essential hypertension Blood pressure is reasonably well controlled.  Noted to be on acebutolol.  Replace potassium.  Magnesium 2.0.  History of PSVT Stable currently.  Continue his beta-blocker.  EKG shows ventricular bigeminy.  Asymptomatic.  CKD stage II Renal function at baseline.  Obesity Estimated body mass index is 35.57 kg/m as calculated from the following:   Height as of this encounter: 5\' 11"  (1.803 m).   Weight as of this encounter: 115.7 kg.    DVT Prophylaxis: Heparin subcutaneous Code Status: Full code Family Communication: Discussed with the patient Disposition Plan: Needs to stay till stenting is performed on Monday.  Status is: Inpatient  Remains inpatient appropriate because: Need for ICA stenting on Monday     Medications: Scheduled:  acebutolol  200 mg Oral BID   aspirin EC  81 mg Oral Q breakfast   atorvastatin  80 mg Oral Daily   cholecalciferol  2,000 Units Oral Daily   heparin  5,000 Units Subcutaneous Q8H   pantoprazole  40 mg Oral Daily   sodium chloride flush  3 mL Intravenous Q12H   sodium chloride flush  3 mL Intravenous Q12H   ticagrelor  90 mg Oral BID   Continuous:  sodium chloride     Wednesday chloride, acetaminophen **OR** acetaminophen, ALPRAZolam, bisacodyl, hydrALAZINE, ondansetron **OR** ondansetron (ZOFRAN) IV, polyethylene glycol, sodium  chloride flush, traZODone  Antibiotics: Anti-infectives (From admission, onward)    None       Objective:  Vital Signs  Vitals:   06/15/21 1704 06/15/21 2106 06/16/21 0520 06/16/21 0814  BP: 126/74 110/79 123/87 139/89  Pulse: 75 78 80 72  Resp: (!) 22 (!) 21 19 18   Temp: 98 F (36.7 C) 97.8 F (36.6 C)  97.9 F (36.6 C) 98.2 F (36.8 C)  TempSrc: Oral Oral Oral Oral  SpO2: 97% 97% 98% 98%  Weight:      Height:       No intake or output data in the 24 hours ending 06/16/21 0829  Filed Weights   06/13/21 0647  Weight: 115.7 kg    General appearance: Awake alert.  In no distress Resp: Clear to auscultation bilaterally.  Normal effort Cardio: S1-S2 is normal regular.  No S3-S4.  No rubs murmurs or bruit GI: Abdomen is soft.  Nontender nondistended.  Bowel sounds are present normal.  No masses organomegaly Extremities: No edema.  Full range of motion of lower extremities. Neurologic: Alert and oriented x3.  No focal neurological deficits.     Lab Results:  Data Reviewed: I have personally reviewed following labs and imaging studies  CBC: Recent Labs  Lab 06/13/21 0725 06/13/21 0731 06/14/21 0219 06/16/21 0152  WBC 11.6*  --  12.7* 13.0*  NEUTROABS 7.9*  --   --   --   HGB 15.0 16.0 14.4 15.6  HCT 45.9 47.0 44.6 47.1  MCV 92.7  --  92.1 91.1  PLT 192  --  188 193     Basic Metabolic Panel: Recent Labs  Lab 06/13/21 0725 06/13/21 0731 06/14/21 0219 06/14/21 2347 06/16/21 0152  NA 139 142 138  --  138  K 3.9 4.1 3.9  --  3.5  CL 106 104 106  --  106  CO2 25  --  26  --  24  GLUCOSE 106* 105* 103*  --  100*  BUN 23 23 19   --  19  CREATININE 1.25* 1.20 1.33*  --  1.32*  CALCIUM 8.7*  --  8.8*  --  8.8*  MG  --   --  1.9 2.0  --      GFR: Estimated Creatinine Clearance: 64.5 mL/min (A) (by C-G formula based on SCr of 1.32 mg/dL (H)).  Liver Function Tests: Recent Labs  Lab 06/13/21 0725  AST 23  ALT 27  ALKPHOS 61  BILITOT 1.0  PROT 6.7  ALBUMIN 3.8      Coagulation Profile: Recent Labs  Lab 06/13/21 0725  INR 1.0       Lipid Profile: Recent Labs    06/14/21 0219  CHOL 155  HDL 38*  LDLCALC 85  TRIG 158*  CHOLHDL 4.1     Thyroid Function Tests: Recent Labs    06/14/21 0219  TSH 3.297      Recent Results (from the  past 240 hour(s))  Resp Panel by RT-PCR (Flu A&B, Covid) Nasopharyngeal Swab     Status: None   Collection Time: 06/13/21  7:42 AM   Specimen: Nasopharyngeal Swab; Nasopharyngeal(NP) swabs in vial transport medium  Result Value Ref Range Status   SARS Coronavirus 2 by RT PCR NEGATIVE NEGATIVE Final    Comment: (NOTE) SARS-CoV-2 target nucleic acids are NOT DETECTED.  The SARS-CoV-2 RNA is generally detectable in upper respiratory specimens during the acute phase of infection. The lowest concentration of SARS-CoV-2 viral copies this assay can  detect is 138 copies/mL. A negative result does not preclude SARS-Cov-2 infection and should not be used as the sole basis for treatment or other patient management decisions. A negative result may occur with  improper specimen collection/handling, submission of specimen other than nasopharyngeal swab, presence of viral mutation(s) within the areas targeted by this assay, and inadequate number of viral copies(<138 copies/mL). A negative result must be combined with clinical observations, patient history, and epidemiological information. The expected result is Negative.  Fact Sheet for Patients:  EntrepreneurPulse.com.au  Fact Sheet for Healthcare Providers:  IncredibleEmployment.be  This test is no t yet approved or cleared by the [place] FDA and  has been authorized for detection and/or diagnosis of SARS-CoV-2 by FDA under an Emergency Use Authorization (EUA). This EUA will remain  in effect (meaning this test can be used) for the duration of the COVID-19 declaration under Section 564(b)(1) of the Act, 21 U.S.C.section 360bbb-3(b)(1), unless the authorization is terminated  or revoked sooner.       Influenza A by PCR NEGATIVE NEGATIVE Final   Influenza B by PCR NEGATIVE NEGATIVE Final    Comment: (NOTE) The Xpert Xpress SARS-CoV-2/FLU/RSV plus assay is intended as an aid in the diagnosis of  influenza from Nasopharyngeal swab specimens and should not be used as a sole basis for treatment. Nasal washings and aspirates are unacceptable for Xpert Xpress SARS-CoV-2/FLU/RSV testing.  Fact Sheet for Patients: EntrepreneurPulse.com.au  Fact Sheet for Healthcare Providers: IncredibleEmployment.be  This test is not yet approved or cleared by the [place] FDA and has been authorized for detection and/or diagnosis of SARS-CoV-2 by FDA under an Emergency Use Authorization (EUA). This EUA will remain in effect (meaning this test can be used) for the duration of the COVID-19 declaration under Section 564(b)(1) of the Act, 21 U.S.C. section 360bbb-3(b)(1), unless the authorization is terminated or revoked.  Performed at [facility], [address] [place], [place] [address]        Radiology Studies: No results found.     LOS: 1 day   Rodriguez Aguinaldo [employer] on [email or web address]  06/16/2021, 8:29 AM

## 2021-06-17 ENCOUNTER — Inpatient Hospital Stay (HOSPITAL_COMMUNITY): Payer: Medicare HMO

## 2021-06-17 ENCOUNTER — Inpatient Hospital Stay (HOSPITAL_COMMUNITY): Payer: Medicare HMO | Admitting: Certified Registered Nurse Anesthetist

## 2021-06-17 ENCOUNTER — Encounter (HOSPITAL_COMMUNITY): Payer: Self-pay | Admitting: Internal Medicine

## 2021-06-17 ENCOUNTER — Encounter (HOSPITAL_COMMUNITY)
Admission: EM | Disposition: A | Discharge status: Home / Self Care | Payer: Self-pay | Source: Home / Self Care | Attending: Internal Medicine

## 2021-06-17 DIAGNOSIS — E669 Obesity, unspecified: Secondary | ICD-10-CM | POA: Diagnosis not present

## 2021-06-17 DIAGNOSIS — I63231 Cerebral infarction due to unspecified occlusion or stenosis of right carotid arteries: Secondary | ICD-10-CM | POA: Diagnosis present

## 2021-06-17 DIAGNOSIS — H3411 Central retinal artery occlusion, right eye: Secondary | ICD-10-CM | POA: Diagnosis not present

## 2021-06-17 DIAGNOSIS — H5461 Unqualified visual loss, right eye, normal vision left eye: Secondary | ICD-10-CM | POA: Diagnosis not present

## 2021-06-17 DIAGNOSIS — I471 Supraventricular tachycardia: Secondary | ICD-10-CM

## 2021-06-17 HISTORY — PX: IR ANGIO INTRA EXTRACRAN SEL INTERNAL CAROTID UNI R MOD SED: IMG5362

## 2021-06-17 HISTORY — PX: IR CT HEAD LTD: IMG2386

## 2021-06-17 HISTORY — PX: IR PERCUTANEOUS ART THROMBECTOMY/INFUSION INTRACRANIAL INC DIAG ANGIO: IMG6087

## 2021-06-17 HISTORY — PX: IR INTRAVSC STENT CERV CAROTID W/EMB-PROT MOD SED INCL ANGIO: IMG2303

## 2021-06-17 HISTORY — PX: RADIOLOGY WITH ANESTHESIA: SHX6223

## 2021-06-17 LAB — PROTIME-INR
INR: 1.1 (ref 0.8–1.2)
Prothrombin Time: 14.2 seconds (ref 11.4–15.2)

## 2021-06-17 LAB — CBC
HCT: 47.1 % (ref 39.0–52.0)
Hemoglobin: 15.5 g/dL (ref 13.0–17.0)
MCH: 30 pg (ref 26.0–34.0)
MCHC: 32.9 g/dL (ref 30.0–36.0)
MCV: 91.1 fL (ref 80.0–100.0)
Platelets: 194 10*3/uL (ref 150–400)
RBC: 5.17 MIL/uL (ref 4.22–5.81)
RDW: 14.4 % (ref 11.5–15.5)
WBC: 11.9 10*3/uL — ABNORMAL HIGH (ref 4.0–10.5)
nRBC: 0 % (ref 0.0–0.2)

## 2021-06-17 LAB — SURGICAL PCR SCREEN
MRSA, PCR: NEGATIVE
Staphylococcus aureus: NEGATIVE

## 2021-06-17 LAB — POCT ACTIVATED CLOTTING TIME: Activated Clotting Time: 197 seconds

## 2021-06-17 IMAGING — XA IR INTRAVSC STENT CERV CAROTID W/ EMB-PROT
7 of 24 series · 7 of 24 positions shown · non-contrast
Comparison: Diagnostic catheter arteriogram [DATE].

CLINICAL DATA: Symptomatic high-grade tandem stenosis of the right
internal carotid artery proximally.

EXAM:
INTRAVSC STENT CERV CAROTID W/ EMB-PROT
TECHNIQUE: Informed written consent was obtained from the patient after a
thorough discussion of the procedural risks, benefits and
alternatives. All questions were addressed. Maximal Sterile Barrier
Technique was utilized including caps, mask, sterile gowns, sterile
gloves, sterile drape, hand hygiene and skin antiseptic. A timeout
was performed prior to the initiation of the procedure.

[Series 2: cerebral · 2 acquisitions, 1 frame shown (1 of 7)]
[im 1/2]
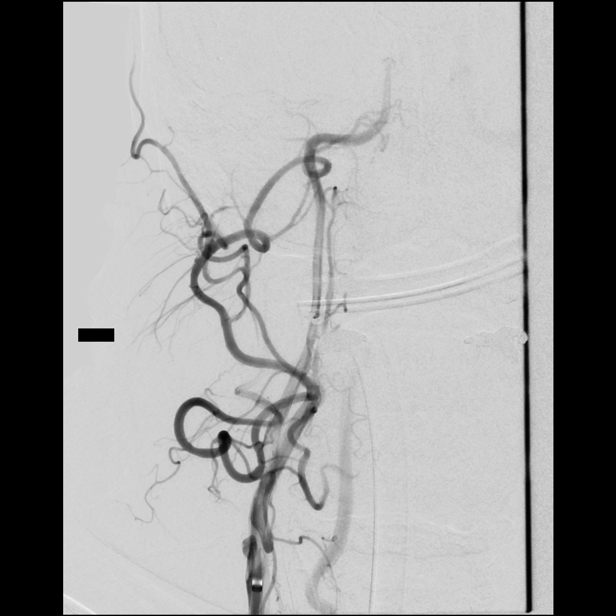

[Series 6: cerebral · 2 acquisitions, 1 frame shown (2 of 7)]
[im 1/2]
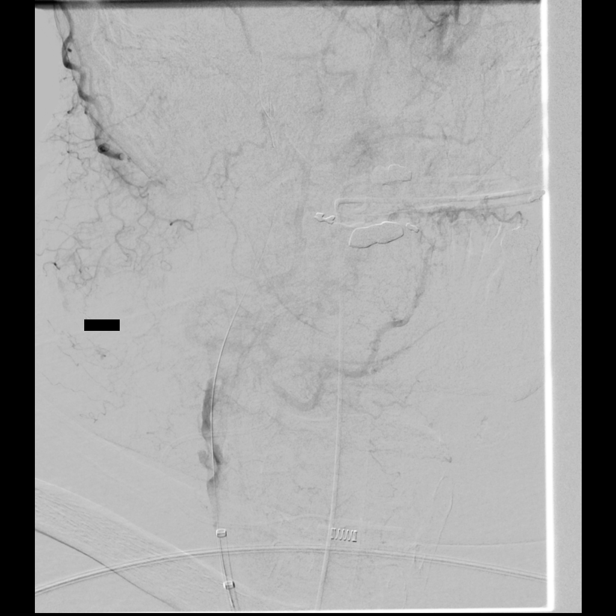

[Series 9: cerebral · 2 acquisitions, 1 frame shown (3 of 7)]
[im 1/2]
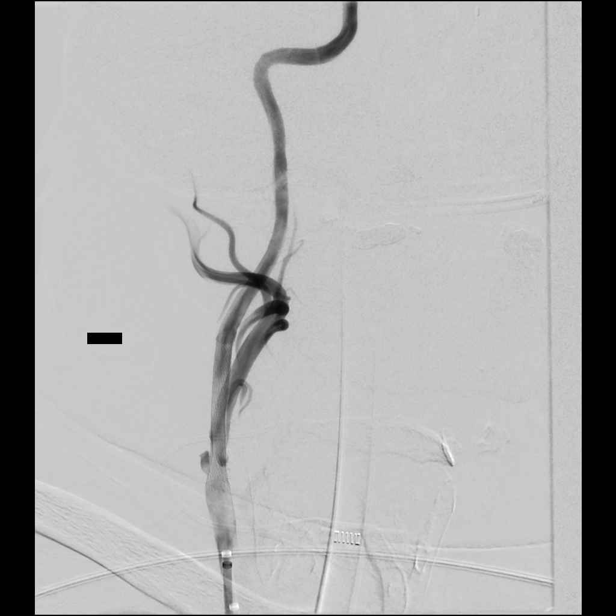

[Series 13: cerebral · 2 acquisitions, 1 frame shown (4 of 7)]
[im 1/2]
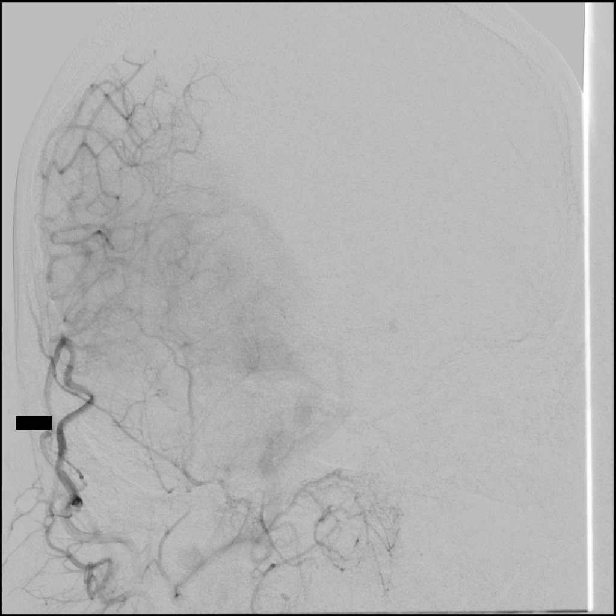

[Series 16: cerebral · 2 acquisitions, 1 frame shown (5 of 7)]
[im 1/2]
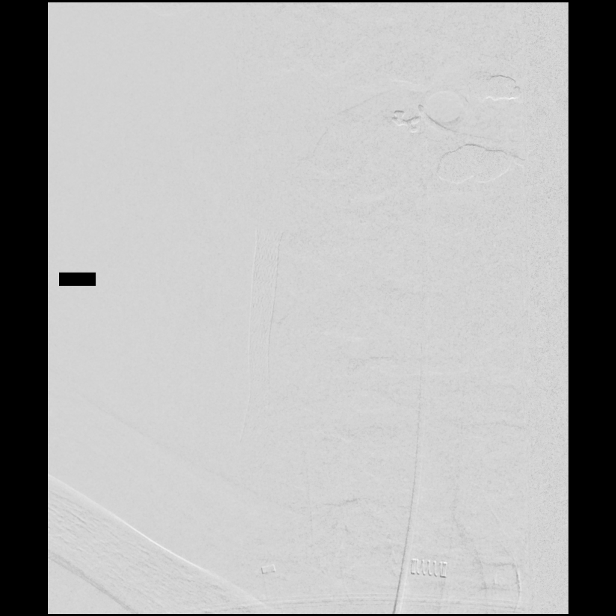

[Series 19: cerebral · 2 acquisitions, 1 frame shown (6 of 7)]
[im 1/2]
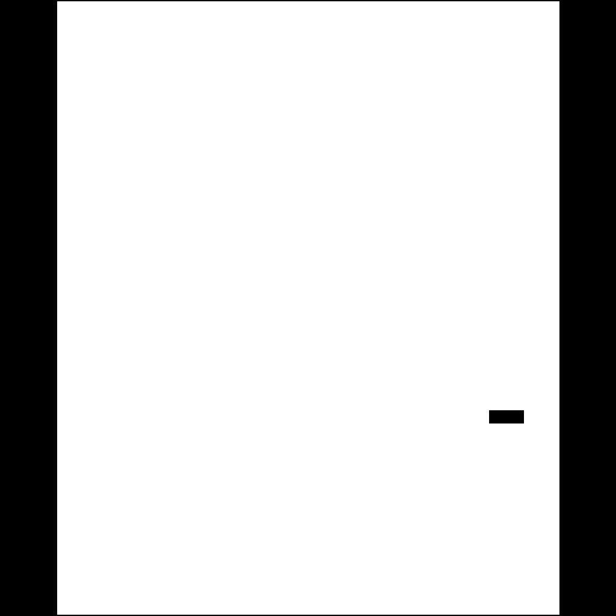

[Series 23: cerebral · 2 acquisitions, 1 frame shown (7 of 7)]
[im 1/2]
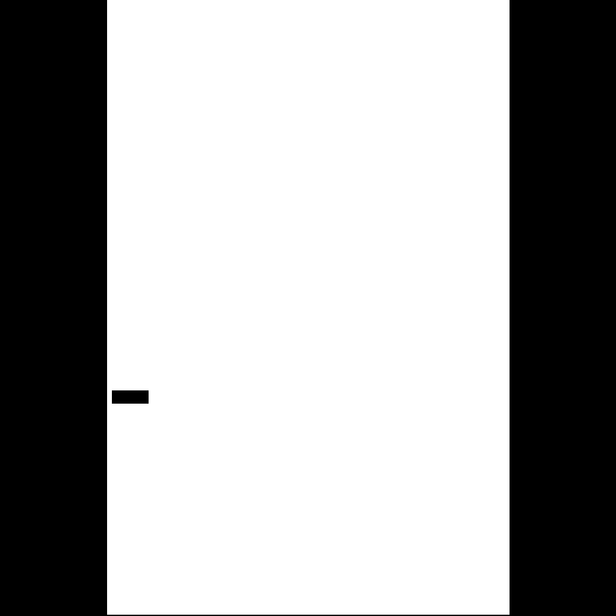

[7 of 24 positions shown; findings below may reference images not displayed]

MEDICATIONS:
Heparin 4,000 units IA and IV. Ancef 2 g IV antibiotic was
administered within 1 hour of the procedure.

ANESTHESIA/SEDATION:
General anesthesia.

CONTRAST:  Omnipaque 300 approximately 130 cc.

FLUOROSCOPY TIME:  Fluoroscopy Time: 27 minutes 12 seconds ([E6]
mGy).

COMPLICATIONS:
None immediate.
The right groin was prepped and draped in the usual sterile fashion.
Thereafter using modified Seldinger technique, transfemoral access
into the right common femoral artery was obtained without
difficulty. Over a 0.035 inch guidewire, an 8 French Pinnacle 25 cm
sheath was inserted. Through this, and also over 0.035 inch
guidewire, a 5 French JB 1 catheter was advanced to the aortic arch
region and selectively positioned in the right common carotid
artery, and the left common carotid artery.
FINDINGS: The innominate arteriogram demonstrates the origin of the right
common carotid artery and the right subclavian artery to be widely
patent.

The visualized right vertebral artery origin to the cranial skull
base is widely patent.

The right common carotid arteriogram demonstrates the right external
carotid artery and its major branches to be widely patent.

The right internal carotid artery at the bulb has a laterally
positioned moderate ulcerated plaque. Distal to this there is a
tight pre occlusive stenosis. Contrast is seen extending antegrade
to the cranial skull base.

Also seen is a thin flattened smooth homogeneous filling defect
extending from the focal severe stenosis proximally to the level of
C1. Distal to this more homogeneous opacification of the right
internal carotid artery is seen in the petrous, the cavernous and
the supraclinoid segments.

Unopacified blood is seen in the supraclinoid right ICA from the
circulation via the right posterior communicating artery, and also
from the contralateral left ICA via anterior communicating artery.

Opacification is seen also of the right middle cerebral artery.

ENDOVASCULAR REVASCULARIZATION OF SYMPTOMATIC SEVERE HIGH-GRADE
STENOSIS OF THE RIGHT INTERNAL CAROTID PROXIMALLY WITH DISTAL
PROTECTION.

Diagnostic JB 1 catheter in the right common carotid artery was
exchanged over a 0.035 inch 300 cm Rosen exchange guidewire for an
087 95 cm balloon guide catheter which been prepped with 50%
contrast and 50% heparinized saline infusion.

Guidewire was removed. Good aspiration obtained from the hub of the
balloon guide catheter which was advanced to proximal to the right
common carotid bifurcation.

Gentle control arteriogram performed through the balloon guide
catheter demonstrated again no change in the extracranial or
intracranial circulations.

At this time, a [DATE] NAV Emboshield distal protection device was
prepped and purged of air in its housing. The combination of the
delivery microcatheter, and the delivery micro guidewire was then
retrieved from the housing.

A moderate J configuration was given to the distal 014 inch micro
guidewire.

Using biplane roadmap technique and constant fluoroscopic guidance
in a coaxial manner with constant heparinized saline infusion,
combination of delivery microcatheter with the 014 inch micro
guidewire combination was advanced to the distal end of the balloon
guide catheter.

Using a torque device, the micro guidewire, and then the delivery
microcatheter were advanced to the distal cervical right ICA. The
filter device was then deployed in the usual fashion under
fluoroscopic guidance without difficulty.

The delivery microcatheter was retrieved. Gentle control arteriogram
performed through the balloon guide catheter in the right common
carotid artery demonstrated antegrade flow in the right internal
carotid artery and distally.

Given the high-grade stenosis, a 4 mm x 30 mm Viatrac 14 angioplasty
balloon catheter was prepped and purged with heparinized saline
retrogradely, and with 70% contrast and 25% heparinized saline
infusion antegradely.

Proximal and distal markers were then positioned adequate distant
from the site of severe stenosis. Control inflation was then
performed using micro inflation syringe device via micro tubing with
the balloon expanded 3.9 atmospheres to just over 4 mm where it was
maintained for approximately 25 seconds.

Balloon was then deflated and retrieved and removed. A control
arteriogram performed through the balloon guide catheter in the
right common carotid artery now demonstrated near complete occlusion
of the right internal carotid artery proximal to distally.

Delayed opacification was seen in the proximal third of the
angioplastied segment of the right internal carotid artery.

A 6 mm x 8 mm x 40 mm Xact stent was then prepped and purged with
heparinized saline infusion.

Using the rapid exchange technique, stent delivery apparatus was
advanced without difficulty and positioned with the distal and
proximal markers adequate distant from the site of the angioplasty.
This stent was then deployed in the usual manner without difficulty.

With the distal protection device stable, the stent delivery
apparatus was removed. A control arteriogram performed through the
balloon guide now demonstrated slow flow through the proximal
portion of the stent. Distal to this area no opacification was seen.

At this time, the retrieval capture device was now advanced again
using the rapid exchange technique to just proximal to the proximal
marker of the filter device.

Filter was then captured into the filter capture catheter and
retrieved and removed.

Upon removal, extensive clot was seen engaged in the filter device.

Control arteriogram performed through the balloon guide catheter in
the right common carotid artery, demonstrated complete
revascularization of the right internal carotid artery
intracranially and extra cranially.

A control arteriogram perfor[REDACTED]ed intracranially demonstrated
flow-limiting filling defect in the right middle cerebral artery
inferior division proximally.

At this time, over a 0.014 inch standard Synchro micro guidewire
with a moderate J configuration, the combination of a 152 cm
microcatheter inside of a 55 136 cm Zoom aspiration catheter was
advanced under constant fluoroscopic guidance proximally through the
stented segment of the right internal carotid artery and then to the
supraclinoid right ICA.

Micro guidewire was then gently manipulated to advance with the
micro guidewire into the inferior division of the right middle
cerebral artery to the distal M2 region followed by the
microcatheter. The guidewire was removed. Good aspiration obtained
from the hub of the microcatheter. A gentle control arteriogram
performed through the microcatheter demonstrated safe positioning of
the tip of the microcatheter.

This was then connected to continuous heparinized saline infusion.

A 4 mm x 40 mm Solitaire X retrieval device was then advanced to the
distal end of the microcatheter.

Retrieval was then followed by retrieving the microcatheter whilst
holding the micro guidewire with the right hand.

At this time the Zoom 55 catheter was advanced adjacent to the clot
in the proximal inferior division.

Thereafter, with proximal flow arrest in the right common carotid
artery and constant aspiration with a 20 mL syringe at the hub of
the balloon guide catheter, and the Penumbra aspiration at the hub
of the 55 Zoom aspiration catheter for approximately 2-1/2 minutes,
the combination of the retrieval device, the microcatheter, and the
Zoom aspiration catheter were retrieved and removed. A control
arteriogram performed through the balloon guide catheter following
reversal of flow arrest in the right common carotid artery
demonstrated complete revascularization of the right middle cerebral
artery achieving a TICI 3 revascularization. Spasm was noted in the
right internal carotid artery distal which responded to 2 aliquots
of 25 mcg nitroglycerin.

Control arteriograms were then performed at 15 and 30 minutes post
deployment of the proximal stent. These continued to demonstrate
excellent flow without intraluminal filling defects in the stented
segment.

More distally, the vascular remained widely patent as did the right
middle cerebral artery and the right anterior cerebral artery.

The patient was also given total of 4.5 mg of Integrilin
intra-arterially in order to prevent platelet aggregation in the
stented segment.

Balloon guide catheter was retrieved and removed. Diagnostic 5
French catheter was then advanced over an 035 inch guidewire and
positioned in the left common carotid artery.

Control arteriogram performed through the diagnostic catheter in the
left common carotid artery demonstrated the left external carotid
artery and its major branches to be widely patent.

The left internal carotid artery at the bulb to the cranial skull
base demonstrates wide patency.

The petrous, cavernous and supraclinoid segments demonstrated
excellent patency.

Left middle cerebral artery and the left anterior cerebral artery
opacified into the capillary and venous phases.

Prompt filling via the anterior communicating artery of the right
anterior cerebral A2 segment, and flash filling of the right A1
segment, and the right middle cerebral artery was evident.

The final diagnostic arteriogram in the right common carotid artery
demonstrated excellent apposition of flow through stented segment of
the right internal carotid artery and distally.

There was approximately 25% stenosis within the stented segment on
the lateral projection only.

The 8 French Pinnacle sheath was removed with successful hemostasis
at the right groin puncture site with an Angio-Seal closure device.
Distal pulses remained Dopplerable in both feet unchanged.

A flat panel CT of the brain demonstrated no evidence of gross mass
effect or midline shift.

Mild subarachnoid hyperattenuation was noted in the right
perisylvian subarachnoid space.

Patient's general anesthesia was reversed, and patient was
extubated. Upon recovery, the patient reported no headache or nausea
or vomiting.

He was able to move all fours spontaneously and to command equally.

Pupils were 3 mm round regular and equal. Patient was then
transferred to the PACU and then neuro ICU.

Later in the evening, the patient was more awake, alert and fully
oriented to place, space and year.

He denied any nausea, vomiting or visual changes. No change was seen
in the previously noted loss of vision in the right eye.

Right groin appeared soft.  Distal pulses were noted.

The following morning, the patient demonstrated normal neurologic
examination. The right groin appeared soft. Distal pulses were
present.

Patient was switched to aspirin 81 mg a day, and Brilinta 90 mg
b.i.d.

He was then transferred to the admitting service.
IMPRESSION: Status post endovascular revascularization of symptomatic high-grade
stenosis of the right internal carotid artery proximally with stent
assisted angioplasty with distal protection.

Status post rescue revascularization of near occlusive thrombus in
the inferior division of the right middle cerebral artery proximally
using 1 pass of a 4 mm x 40 mm Solitaire X retrieval device with
contact aspiration achieving a TICI 3 revascularization.

PLAN:
Follow-up in clinic 2 weeks post discharge.

In the meantime, the patient advised to refrain from lifting weights
above 10 pounds for 2 weeks. He was advised to use a walker as need
be. Patient also advised not to drive for 2 weeks and maintain
adequate hydration.

## 2021-06-17 SURGERY — IR WITH ANESTHESIA
Anesthesia: General

## 2021-06-17 MED ORDER — CLEVIDIPINE BUTYRATE 0.5 MG/ML IV EMUL
INTRAVENOUS | Status: AC
  Filled 2021-06-17: qty 50

## 2021-06-17 MED ORDER — CEFAZOLIN SODIUM-DEXTROSE 2-4 GM/100ML-% IV SOLN
INTRAVENOUS | Status: AC
  Filled 2021-06-17: qty 100

## 2021-06-17 MED ORDER — FENTANYL CITRATE (PF) 250 MCG/5ML IJ SOLN
INTRAMUSCULAR | Status: DC | PRN
  Administered 2021-06-17: 100 ug via INTRAVENOUS

## 2021-06-17 MED ORDER — FENTANYL CITRATE (PF) 100 MCG/2ML IJ SOLN: 25.0000 ug | INTRAMUSCULAR | Status: DC | PRN

## 2021-06-17 MED ORDER — ACETAMINOPHEN 650 MG RE SUPP: 650.0000 mg | RECTAL | Status: DC | PRN

## 2021-06-17 MED ORDER — SODIUM CHLORIDE 0.9 % IV SOLN: INTRAVENOUS | Status: DC

## 2021-06-17 MED ORDER — CLEVIDIPINE BUTYRATE 0.5 MG/ML IV EMUL
INTRAVENOUS | Status: DC | PRN
  Administered 2021-06-17: 4 mg/h via INTRAVENOUS

## 2021-06-17 MED ORDER — CLEVIDIPINE BUTYRATE 0.5 MG/ML IV EMUL
0.0000 mg/h | INTRAVENOUS | Status: DC
  Administered 2021-06-17: 19:00:00 6 mg/h via INTRAVENOUS
  Administered 2021-06-17: 17:00:00 2 mg/h via INTRAVENOUS
  Administered 2021-06-18 (×2): 4 mg/h via INTRAVENOUS
  Filled 2021-06-17 (×4): qty 50

## 2021-06-17 MED ORDER — ACETAMINOPHEN 160 MG/5ML PO SOLN: 650.0000 mg | ORAL | Status: DC | PRN

## 2021-06-17 MED ORDER — CHLORHEXIDINE GLUCONATE CLOTH 2 % EX PADS
6.0000 | MEDICATED_PAD | Freq: Every day | CUTANEOUS | Status: DC
  Administered 2021-06-17 – 2021-06-19 (×3): 6 via TOPICAL

## 2021-06-17 MED ORDER — CEFAZOLIN SODIUM-DEXTROSE 2-3 GM-%(50ML) IV SOLR
INTRAVENOUS | Status: DC | PRN
  Administered 2021-06-17: 2 g via INTRAVENOUS

## 2021-06-17 MED ORDER — TICAGRELOR 90 MG PO TABS
90.0000 mg | ORAL_TABLET | Freq: Two times a day (BID) | ORAL | Status: DC
  Administered 2021-06-17 – 2021-06-19 (×4): 90 mg via ORAL
  Filled 2021-06-17 (×3): qty 1

## 2021-06-17 MED ORDER — ACETAMINOPHEN 325 MG PO TABS
650.0000 mg | ORAL_TABLET | ORAL | Status: DC | PRN
  Administered 2021-06-17: 20:00:00 650 mg via ORAL
  Filled 2021-06-17: qty 2

## 2021-06-17 MED ORDER — FENTANYL CITRATE (PF) 100 MCG/2ML IJ SOLN
INTRAMUSCULAR | Status: AC
  Filled 2021-06-17: qty 2

## 2021-06-17 MED ORDER — EPHEDRINE SULFATE-NACL 50-0.9 MG/10ML-% IV SOSY
PREFILLED_SYRINGE | INTRAVENOUS | Status: DC | PRN
  Administered 2021-06-17: 10 mg via INTRAVENOUS
  Administered 2021-06-17: 5 mg via INTRAVENOUS

## 2021-06-17 MED ORDER — TICAGRELOR 90 MG PO TABS: 90.0000 mg | ORAL_TABLET | Freq: Two times a day (BID) | ORAL | Status: DC

## 2021-06-17 MED ORDER — ONDANSETRON HCL 4 MG/2ML IJ SOLN: 4.0000 mg | Freq: Once | INTRAMUSCULAR | Status: DC | PRN

## 2021-06-17 MED ORDER — LACTATED RINGERS IV SOLN: INTRAVENOUS | Status: DC

## 2021-06-17 MED ORDER — EPTIFIBATIDE 20 MG/10ML IV SOLN
INTRAVENOUS | Status: AC | PRN
  Administered 2021-06-17 (×3): 1.5 mg via INTRAVENOUS

## 2021-06-17 MED ORDER — EPTIFIBATIDE 20 MG/10ML IV SOLN
INTRAVENOUS | Status: AC
  Filled 2021-06-17: qty 10

## 2021-06-17 MED ORDER — NITROGLYCERIN 1 MG/10 ML FOR IR/CATH LAB
INTRA_ARTERIAL | Status: AC
  Filled 2021-06-17: qty 10

## 2021-06-17 MED ORDER — SODIUM CHLORIDE (PF) 0.9 % IJ SOLN
INTRAVENOUS | Status: AC | PRN
  Administered 2021-06-17 (×3): 25 ug via INTRA_ARTERIAL

## 2021-06-17 MED ORDER — DEXAMETHASONE SODIUM PHOSPHATE 10 MG/ML IJ SOLN
INTRAMUSCULAR | Status: DC | PRN
  Administered 2021-06-17: 10 mg via INTRAVENOUS

## 2021-06-17 MED ORDER — ORAL CARE MOUTH RINSE: 15.0000 mL | Freq: Once | OROMUCOSAL | Status: AC

## 2021-06-17 MED ORDER — ROCURONIUM BROMIDE 10 MG/ML (PF) SYRINGE
PREFILLED_SYRINGE | INTRAVENOUS | Status: DC | PRN
  Administered 2021-06-17: 60 mg via INTRAVENOUS

## 2021-06-17 MED ORDER — GLYCOPYRROLATE PF 0.2 MG/ML IJ SOSY
PREFILLED_SYRINGE | INTRAMUSCULAR | Status: DC | PRN
  Administered 2021-06-17: .2 mg via INTRAVENOUS

## 2021-06-17 MED ORDER — PHENYLEPHRINE 40 MCG/ML (10ML) SYRINGE FOR IV PUSH (FOR BLOOD PRESSURE SUPPORT)
PREFILLED_SYRINGE | INTRAVENOUS | Status: DC | PRN
Start: 2021-06-17 — End: 2021-06-17
  Administered 2021-06-17: 120 ug via INTRAVENOUS
  Administered 2021-06-17: 40 ug via INTRAVENOUS

## 2021-06-17 MED ORDER — EPHEDRINE SULFATE-NACL 50-0.9 MG/10ML-% IV SOSY
PREFILLED_SYRINGE | INTRAVENOUS | Status: DC | PRN
  Administered 2021-06-17: 14:00:00 10 mg via INTRAVENOUS

## 2021-06-17 MED ORDER — PROPOFOL 10 MG/ML IV BOLUS
INTRAVENOUS | Status: DC | PRN
  Administered 2021-06-17: 150 mg via INTRAVENOUS

## 2021-06-17 MED ORDER — FENTANYL CITRATE (PF) 100 MCG/2ML IJ SOLN: INTRAMUSCULAR | Status: DC | PRN

## 2021-06-17 MED ORDER — HEPARIN SODIUM (PORCINE) 5000 UNIT/ML IJ SOLN
5000.0000 [IU] | Freq: Three times a day (TID) | INTRAMUSCULAR | Status: DC
  Administered 2021-06-17: 14:00:00 3000 [IU] via SUBCUTANEOUS
  Administered 2021-06-17: 15:00:00 1000 [IU] via SUBCUTANEOUS
  Administered 2021-06-18 – 2021-06-19 (×4): 5000 [IU] via SUBCUTANEOUS
  Filled 2021-06-17 (×2): qty 1

## 2021-06-17 MED ORDER — LIDOCAINE 2% (20 MG/ML) 5 ML SYRINGE
INTRAMUSCULAR | Status: DC | PRN
  Administered 2021-06-17: 60 mg via INTRAVENOUS

## 2021-06-17 MED ORDER — AMISULPRIDE (ANTIEMETIC) 5 MG/2ML IV SOLN: 10.0000 mg | Freq: Once | INTRAVENOUS | Status: DC | PRN

## 2021-06-17 MED ORDER — PHENYLEPHRINE HCL-NACL 20-0.9 MG/250ML-% IV SOLN
INTRAVENOUS | Status: DC | PRN
  Administered 2021-06-17: 25 ug/min via INTRAVENOUS

## 2021-06-17 MED ORDER — ASPIRIN 81 MG PO CHEW
81.0000 mg | CHEWABLE_TABLET | Freq: Every day | ORAL | Status: DC
  Filled 2021-06-17: qty 1

## 2021-06-17 MED ORDER — IOHEXOL 300 MG/ML  SOLN
100.0000 mL | Freq: Once | INTRAMUSCULAR | Status: AC | PRN
  Administered 2021-06-17: 16:00:00 150 mL via INTRA_ARTERIAL

## 2021-06-17 MED ORDER — ASPIRIN 81 MG PO CHEW
81.0000 mg | CHEWABLE_TABLET | Freq: Every day | ORAL | Status: DC
  Administered 2021-06-18 – 2021-06-19 (×2): 81 mg via ORAL
  Filled 2021-06-17: qty 1

## 2021-06-17 MED ORDER — CHLORHEXIDINE GLUCONATE 0.12 % MT SOLN
OROMUCOSAL | Status: AC
  Administered 2021-06-17: 12:00:00 15 mL via OROMUCOSAL
  Filled 2021-06-17: qty 15

## 2021-06-17 MED ORDER — CHLORHEXIDINE GLUCONATE 0.12 % MT SOLN: 15.0000 mL | Freq: Once | OROMUCOSAL | Status: AC

## 2021-06-17 NOTE — Sedation Documentation (Signed)
Stent deployed at 1418

## 2021-06-17 NOTE — Sedation Documentation (Signed)
ACT 197 notified Dr Deveshwar °

## 2021-06-17 NOTE — Sedation Documentation (Signed)
Soft neck collar applied prior to transport to PACU.

## 2021-06-17 NOTE — Anesthesia Procedure Notes (Signed)
Procedure Name: Intubation Date/Time: 06/17/2021 1:30 PM Performed by: Bryson Corona, CRNA Pre-anesthesia Checklist: Patient identified, Emergency Drugs available, Suction available and Patient being monitored Patient Re-evaluated:Patient Re-evaluated prior to induction Oxygen Delivery Method: Circle System Utilized Preoxygenation: Pre-oxygenation with 100% oxygen Induction Type: IV induction Ventilation: Two handed mask ventilation required Laryngoscope Size: Mac and 4 Grade View: Grade I Tube type: Oral Tube size: 7.0 mm Number of attempts: 1 Airway Equipment and Method: Stylet Placement Confirmation: ETT inserted through vocal cords under direct vision, positive ETCO2 and breath sounds checked- equal and bilateral Secured at: 22 cm Tube secured with: Tape Dental Injury: Teeth and Oropharynx as per pre-operative assessment

## 2021-06-17 NOTE — Transfer of Care (Signed)
Immediate Anesthesia Transfer of Care Note  Patient: Stephen Hull  Procedure(s) Performed: IR WITH ANESTHESIA  Patient Location: PACU  Anesthesia Type:General  Level of Consciousness: awake, alert  and patient cooperative  Airway & Oxygen Therapy: Patient Spontanous Breathing and Patient connected to face mask oxygen  Post-op Assessment: Report given to RN and Post -op Vital signs reviewed and stable  Post vital signs: Reviewed and stable  Last Vitals:  Vitals Value Taken Time  BP 121/74 06/17/21 1609  Temp 36.6 C 06/17/21 1609  Pulse 78 06/17/21 1616  Resp 15 06/17/21 1616  SpO2 94 % 06/17/21 1616  Vitals shown include unvalidated device data.  Last Pain:  Vitals:   06/17/21 1143  TempSrc: Oral  PainSc: 0-No pain         Complications: No notable events documented.

## 2021-06-17 NOTE — Progress Notes (Signed)
[facility] PROGRESS NOTE   Stephen Hull UKG:[id number] DOB: Dec 08, 1945 DOA: 06/13/2021  PCP: Richmond Campbell., PA-C  Brief History/Interval Summary: Stephen Hull  is a 75 y.o. male who is a reformed smoker (quit in 1991) with past medical history relevant for HTN, HLD, PSVT as well as history of chronic asymptomatic bradycardia and history of left carotid artery stenosis presented to the ED with sudden onset of right eye vision loss and headache starting around 4:30 AM on day of admission. He reports intermittent loss of vision of the inferior visual field of the right eye for the past 2 days. Lately patient has been having intermittent right facial and right upper extremity numbness and tingling and he was undergoing work-up at the [facility] and with his PCP at [facility]. EDP discussed case with on-call neurologist recommended transfer to [facility] for further neurology work-up and MRI. In ED ocular ultrasound was done by EDP see documentation in the EDP (Dr Acey Lav)  notes  Patient was transferred to [facility] for further work-up  Reason for Visit: Acute stroke  Consultants: Neurology  Procedures:  Transthoracic echocardiogram  Cerebral angiogram 11/25  Plan is for ICA stenting today    Subjective/Interval History: Patient feels well.  Slept well overnight.  Denies any further acute anxiety episodes.  Can only see light when flashed on his right eye.  Cannot sense any movements     Assessment/Plan:  Acute vision loss/acute stroke Patient has undergone MRI brain, MRA head and neck, transthoracic echocardiogram.  Echocardiogram shows normal systolic function.  No embolic source identified. 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 Patient was initially placed on aspirin and Plavix.  Subsequently loaded with Brilinta.  Plavix was discontinued. Underwent cerebral angiogram which showed severe stenosis of proximal right ICA with  antegrade flow to the terminal right ICA.  Questionable focal dissection just distal to the above.  Patent distal right ICA intracranially.  Plan is for stenting of the ICA today. LDL is 85.  Patient is on statin. HbA1c is 5.4.  TSH 3.2.  Urine drug screen unremarkable. PT and OT evaluation.  Leukocytosis was likely reactive.  He is afebrile. Patient to follow-up with an ophthalmologist.  This was discussed with him.  He mentions that he will follow-up with Dr. Nile Riggs.  Essential hypertension Blood pressure is reasonably well controlled.  Noted to be on acebutolol.  Replace potassium.  Magnesium 2.0.  History of PSVT Stable currently.  Continue his beta-blocker.  EKG shows ventricular bigeminy.  Asymptomatic.  CKD stage II Renal function at baseline.  Obesity Estimated body mass index is 35.57 kg/m as calculated from the following:   Height as of this encounter: 5\' 11"  (1.803 m).   Weight as of this encounter: 115.7 kg.    DVT Prophylaxis: Heparin subcutaneous Code Status: Full code Family Communication: Discussed with the patient Disposition Plan: Hopefully return home in 24 to 48 hours.  Status is: Inpatient  Remains inpatient appropriate because: Need for ICA stenting on Monday     Medications: Scheduled:  acebutolol  200 mg Oral BID   aspirin EC  81 mg Oral Q breakfast   atorvastatin  80 mg Oral Daily   cholecalciferol  2,000 Units Oral Daily   [START ON 06/18/2021] heparin  5,000 Units Subcutaneous Q8H   pantoprazole  40 mg Oral Daily   sodium chloride flush  3 mL Intravenous Q12H   sodium chloride flush  3 mL Intravenous  Q12H   ticagrelor  90 mg Oral BID   Continuous:  sodium chloride     FN:[id number] chloride, acetaminophen **OR** acetaminophen, ALPRAZolam, bisacodyl, hydrALAZINE, ondansetron **OR** ondansetron (ZOFRAN) IV, polyethylene glycol, sodium chloride flush, traZODone  Antibiotics: Anti-infectives (From admission, onward)    None        Objective:  Vital Signs  Vitals:   06/16/21 2138 06/17/21 0048 06/17/21 0515 06/17/21 0749  BP: 119/73 121/73 129/76 129/86  Pulse:  84 71 70  Resp: 18 19 18 14   Temp: 97.8 F (36.6 C) 98.9 F (37.2 C) 98 F (36.7 C) 98 F (36.7 C)  TempSrc: Oral Axillary Oral Oral  SpO2: 96% 97% 97% 97%  Weight:      Height:       No intake or output data in the 24 hours ending 06/17/21 0922  Filed Weights   06/13/21 0647  Weight: 115.7 kg    General appearance: Awake alert.  In no distress Resp: Clear to auscultation bilaterally.  Normal effort Cardio: S1-S2 is normal regular.  No S3-S4.  No rubs murmurs or bruit GI: Abdomen is soft.  Nontender nondistended.  Bowel sounds are present normal.  No masses organomegaly Extremities: No edema.  Full range of motion of lower extremities. Neurologic: Alert and oriented x3.  No focal neurological deficits.     Lab Results:  Data Reviewed: I have personally reviewed following labs and imaging studies  CBC: Recent Labs  Lab 06/13/21 0725 06/13/21 0731 06/14/21 0219 06/16/21 0152  WBC 11.6*  --  12.7* 13.0*  NEUTROABS 7.9*  --   --   --   HGB 15.0 16.0 14.4 15.6  HCT 45.9 47.0 44.6 47.1  MCV 92.7  --  92.1 91.1  PLT 192  --  188 193     Basic Metabolic Panel: Recent Labs  Lab 06/13/21 0725 06/13/21 0731 06/14/21 0219 06/14/21 2347 06/16/21 0152  NA 139 142 138  --  138  K 3.9 4.1 3.9  --  3.5  CL 106 104 106  --  106  CO2 25  --  26  --  24  GLUCOSE 106* 105* 103*  --  100*  BUN 23 23 19   --  19  CREATININE 1.25* 1.20 1.33*  --  1.32*  CALCIUM 8.7*  --  8.8*  --  8.8*  MG  --   --  1.9 2.0  --      GFR: Estimated Creatinine Clearance: 64.5 mL/min (A) (by C-G formula based on SCr of 1.32 mg/dL (H)).  Liver Function Tests: Recent Labs  Lab 06/13/21 0725  AST 23  ALT 27  ALKPHOS 61  BILITOT 1.0  PROT 6.7  ALBUMIN 3.8      Coagulation Profile: Recent Labs  Lab 06/13/21 0725  INR 1.0        Recent Results (from the past 240 hour(s))  Resp Panel by RT-PCR (Flu A&B, Covid) Nasopharyngeal Swab     Status: None   Collection Time: 06/13/21  7:42 AM   Specimen: Nasopharyngeal Swab; Nasopharyngeal(NP) swabs in vial transport medium  Result Value Ref Range Status   SARS Coronavirus 2 by RT PCR NEGATIVE NEGATIVE Final    Comment: (NOTE) SARS-CoV-2 target nucleic acids are NOT DETECTED.  The SARS-CoV-2 RNA is generally detectable in upper respiratory specimens during the acute phase of infection. The lowest concentration of SARS-CoV-2 viral copies this assay can detect is 138 copies/mL. A negative result does not preclude SARS-Cov-2 infection and  should not be used as the sole basis for treatment or other patient management decisions. A negative result may occur with  improper specimen collection/handling, submission of specimen other than nasopharyngeal swab, presence of viral mutation(s) within the areas targeted by this assay, and inadequate number of viral copies(<138 copies/mL). A negative result must be combined with clinical observations, patient history, and epidemiological information. The expected result is Negative.  Fact Sheet for Patients:  EntrepreneurPulse.com.au  Fact Sheet for Healthcare Providers:  IncredibleEmployment.be  This test is no t yet approved or cleared by the [place] FDA and  has been authorized for detection and/or diagnosis of SARS-CoV-2 by FDA under an Emergency Use Authorization (EUA). This EUA will remain  in effect (meaning this test can be used) for the duration of the COVID-19 declaration under Section 564(b)(1) of the Act, 21 U.S.C.section 360bbb-3(b)(1), unless the authorization is terminated  or revoked sooner.       Influenza A by PCR NEGATIVE NEGATIVE Final   Influenza B by PCR NEGATIVE NEGATIVE Final    Comment: (NOTE) The Xpert Xpress SARS-CoV-2/FLU/RSV plus assay is intended  as an aid in the diagnosis of influenza from Nasopharyngeal swab specimens and should not be used as a sole basis for treatment. Nasal washings and aspirates are unacceptable for Xpert Xpress SARS-CoV-2/FLU/RSV testing.  Fact Sheet for Patients: EntrepreneurPulse.com.au  Fact Sheet for Healthcare Providers: IncredibleEmployment.be  This test is not yet approved or cleared by the [place] FDA and has been authorized for detection and/or diagnosis of SARS-CoV-2 by FDA under an Emergency Use Authorization (EUA). This EUA will remain in effect (meaning this test can be used) for the duration of the COVID-19 declaration under Section 564(b)(1) of the Act, 21 U.S.C. section 360bbb-3(b)(1), unless the authorization is terminated or revoked.  Performed at [facility], [address] [place], [place] [address]   Surgical pcr screen     Status: None   Collection Time: 06/17/21  5:20 AM   Specimen: Nasal Mucosa; Nasal Swab  Result Value Ref Range Status   MRSA, PCR NEGATIVE NEGATIVE Final   Staphylococcus aureus NEGATIVE NEGATIVE Final    Comment: (NOTE) The Xpert SA Assay (FDA approved for NASAL specimens in patients 76 years of age and older), is one component of a comprehensive surveillance program. It is not intended to diagnose infection nor to guide or monitor treatment. Performed at [facility], [place] [address] [place], [place] [address]        Radiology Studies: No results found.     LOS: 2 days   [facility] Pager on [email or web address]  06/17/2021, 9:22 AM
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
## 2021-06-17 NOTE — H&P (Signed)
HPI:  The patient has had a H&P performed within the last 30 days, all history, medications, and exam have been reviewed. The patient denies any interval changes since the H&P.  Patient denies any new complaints today, has been appropriately NPO and is ready to proceed with cerebral angiogram with possible intervention of right ICA stenosis under general anesthesia.  Medications: Prior to Admission medications   Medication Sig Start Date End Date Taking? Authorizing Provider  acebutolol (SECTRAL) 200 MG capsule Take 1 capsule by mouth 2 (two) times daily. 06/18/16  Yes [provider]  acetaminophen (TYLENOL) 325 MG tablet Take 650 mg by mouth every 6 (six) hours as needed for headache.   Yes [provider]  aspirin 81 MG chewable tablet Chew 81 mg by mouth daily.   Yes [provider]  atorvastatin (LIPITOR) 40 MG tablet Take 1 tablet (40 mg total) by mouth daily. 06/06/21 09/04/21 Yes Marjie Skiff E, PA-C  Cholecalciferol (VITAMIN D3) 2000 units TABS Take 2,000 Units by mouth daily.   Yes [provider]  dextromethorphan-guaiFENesin (MUCINEX DM) 30-600 MG 12hr tablet Take 1 tablet by mouth daily as needed for cough.   Yes [provider]  losartan (COZAAR) 50 MG tablet Take 1 tablet by mouth daily. 06/18/16  Yes [provider]  omeprazole (PRILOSEC) 40 MG capsule Take 1 capsule by mouth daily. 06/18/16  Yes [provider]  tamsulosin (FLOMAX) 0.4 MG CAPS capsule Take 0.4 mg by mouth daily. 06/12/21  Yes [provider]  triamcinolone ointment (KENALOG) 0.5 % Apply 1 application topically 2 (two) times daily as needed (Irritation). 01/27/20  Yes [provider]     Vital Signs: BP 129/86 (BP Location: Right Arm)   Pulse 70   Temp 98 F (36.7 C) (Oral)   Resp 14   Ht 5\' 11"  (1.803 m)   Wt 255 lb (115.7 kg)   SpO2 97%   BMI 35.57 kg/m   Physical Exam Vitals and nursing note reviewed.   Constitutional:      General: He is not in acute distress. HENT:     Head: Normocephalic.     Mouth/Throat:     Mouth: Mucous membranes are moist.     Pharynx: Oropharynx is clear. No oropharyngeal exudate or posterior oropharyngeal erythema.  Cardiovascular:     Rate and Rhythm: Normal rate and regular rhythm.  Pulmonary:     Effort: Pulmonary effort is normal.     Breath sounds: Normal breath sounds.  Abdominal:     Palpations: Abdomen is soft.  Skin:    General: Skin is warm and dry.  Neurological:     Mental Status: He is alert.  Psychiatric:        Mood and Affect: Mood normal.        Thought Content: Thought content normal.        Judgment: Judgment normal.  Alert, awake, and oriented x 4 Speech and comprehension intact PERRL bilaterally EOMs without nystagmus or subjective diplopia. Visual fields grossly intact no facial asymmetry. Tongue midline Motor power full all 4 extremities   Mallampati Score:  MD Evaluation Airway: WNL Heart: WNL Abdomen: WNL Chest/ Lungs: WNL ASA  Classification: Per MD or Designee Mallampati/Airway Score: One  Labs:  CBC: Recent Labs    06/13/21 0725 06/13/21 0731 06/14/21 0219 06/16/21 0152  WBC 11.6*  --  12.7* 13.0*  HGB 15.0 16.0 14.4 15.6  HCT 45.9 47.0 44.6 47.1  PLT 192  --  188 193    COAGS: Recent Labs    06/13/21 0725  INR 1.0  APTT 29    BMP: Recent Labs    06/13/21 0725 06/13/21 0731 06/14/21 0219 06/16/21 0152  NA 139 142 138 138  K 3.9 4.1 3.9 3.5  CL 106 104 106 106  CO2 25  --  26 24  GLUCOSE 106* 105* 103* 100*  BUN 23 23 19 19   CALCIUM 8.7*  --  8.8* 8.8*  CREATININE 1.25* 1.20 1.33* 1.32*  GFRNONAA >60  --  56* 57*    LIVER FUNCTION TESTS: Recent Labs    06/13/21 0725  BILITOT 1.0  AST 23  ALT 27  ALKPHOS 61  PROT 6.7  ALBUMIN 3.8    Assessment/Plan:   75 y/o M with recently noted symptomatic right ICA stenosis seen today for image guided cerebral angiogram with  possible angioplasty/stent placement within right ICA under general anesthesia.  Risks and benefits of cerebral arteriogram with intervention were discussed with the patient including, but not limited to bleeding, infection, vascular injury, contrast induced renal failure, stroke, reperfusion hemorrhage, or even death.  This interventional procedure involves the use of X-rays and because of the nature of the planned procedure, it is possible that we will have prolonged use of X-ray fluoroscopy. Potential radiation risks to you include (but are not limited to) the following: - A slightly elevated risk for cancer  several years later in life. This risk is typically less than 0.5% percent. This risk is low in comparison to the normal incidence of human cancer, which is 33% for women and 50% for men according to the American Cancer Society. - Radiation induced injury can include skin redness, resembling a rash, tissue breakdown / ulcers and hair loss (which can be temporary or permanent).  The likelihood of either of these occurring depends on the difficulty of the procedure and whether you are sensitive to radiation due to previous procedures, disease, or genetic conditions.  IF your procedure requires a prolonged use of radiation, you will be notified and given written instructions for further action.  It is your responsibility to monitor the irradiated area for the 2 weeks following the procedure and to notify your physician if you are concerned that you have suffered a radiation induced injury.    All of the patient's questions were answered, patient is agreeable to proceed.  Consent signed and in chart.  Signed: 67 06/17/2021, 8:34 AM

## 2021-06-17 NOTE — Anesthesia Procedure Notes (Signed)
Arterial Line Insertion Start/End11/28/2022 12:45 PM, 06/17/2021 1:10 PM Performed by: Jodell Cipro, CRNA, CRNA  Patient location: Pre-op. Preanesthetic checklist: patient identified, IV checked, site marked, risks and benefits discussed, surgical consent, monitors and equipment checked, pre-op evaluation, timeout performed and anesthesia consent Lidocaine 1% used for infiltration Left, radial was placed Catheter size: 20 G Hand hygiene performed  and maximum sterile barriers used  Allen's test indicative of satisfactory collateral circulation Attempts: 2 Procedure performed without using ultrasound guided technique. Following insertion, dressing applied and Biopatch. Post procedure assessment: normal  Patient tolerated the procedure well with no immediate complications.

## 2021-06-17 NOTE — Sedation Documentation (Addendum)
Balloon deployment time 1401

## 2021-06-17 NOTE — Procedures (Signed)
S/P RT common carotid arteriogram. RT CFA approach. S/P revascularization of o=near occlkuded RT ICA with syeny assisted angioplasty with distal protection..  S/P rescue revascularization of acute RT MCA distal M1 occlusion due to distal migration of clot with x1 pass with solitaire X46mmx 40 mm retriever and contact aspiration acieving aTICI 3 revascularization. Post CT brain Mild RT post parietal subarachnoid hyperdensity.No mass effect.  Extubated. Pupils 2 to 3 mm Rt = LT moves all 4s spontaneously and to command. 28F angioseal for hemostasis  at CFA site. Distal pulses dollperable unchained.  Dr .Roda Shutters  and Dr. Osvaldo Shipper informed. S.Emmalena Canny MD
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
## 2021-06-17 NOTE — Anesthesia Preprocedure Evaluation (Addendum)
Anesthesia Evaluation  Patient identified by MRN, date of birth, ID band Patient awake    Reviewed: Allergy & Precautions, NPO status , Patient's Chart, lab work & pertinent test results  Airway Mallampati: III  TM Distance: >3 FB Neck ROM: Full    Dental no notable dental hx.    Pulmonary former smoker,    Pulmonary exam normal breath sounds clear to auscultation       Cardiovascular hypertension, Pt. on medications Normal cardiovascular exam Rhythm:Regular Rate:Normal  ECG: SR, rate 90   ECHO: Left ventricular ejection fraction, by estimation, is 55 to 60%. The left ventricle has normal function. The left ventricle has no regional wall motion abnormalities. Left ventricular diastolic parameters are indeterminate. Right ventricular systolic function is normal. The right ventricular size is normal. There is normal pulmonary artery systolic pressure. The estimated right ventricular systolic pressure is 28.8 mmHg. Left atrial size was mildly dilated. The mitral valve is grossly normal. Trivial mitral valve regurgitation. No evidence of mitral stenosis. The aortic valve is tricuspid. Aortic valve regurgitation is not visualized. No aortic stenosis is present. The inferior vena cava is normal in size with greater than 50% respiratory variability, suggesting right atrial pressure of 3 mmHg.   Neuro/Psych CVA (right eye blindness), Residual Symptoms    GI/Hepatic negative GI ROS, Neg liver ROS,   Endo/Other  negative endocrine ROS  Renal/GU negative Renal ROS     Musculoskeletal negative musculoskeletal ROS (+)   Abdominal (+) + obese,   Peds  Hematology negative hematology ROS (+)   Anesthesia Other Findings Right ICA stenosis   Reproductive/Obstetrics                            Anesthesia Physical Anesthesia Plan  ASA: 3  Anesthesia Plan: General   Post-op Pain Management:     Induction:   PONV Risk Score and Plan: 2 and Ondansetron, Dexamethasone and Treatment may vary due to age or medical condition  Airway Management Planned: Oral ETT  Additional Equipment: Arterial line  Intra-op Plan:   Post-operative Plan: Extubation in OR  Informed Consent: I have reviewed the patients History and Physical, chart, labs and discussed the procedure including the risks, benefits and alternatives for the proposed anesthesia with the patient or authorized representative who has indicated his/her understanding and acceptance.     Dental advisory given  Plan Discussed with: CRNA and Surgeon  Anesthesia Plan Comments:         Anesthesia Quick Evaluation

## 2021-06-18 ENCOUNTER — Encounter (HOSPITAL_COMMUNITY): Payer: Self-pay | Admitting: Interventional Radiology

## 2021-06-18 ENCOUNTER — Other Ambulatory Visit (HOSPITAL_COMMUNITY): Payer: Self-pay

## 2021-06-18 LAB — CBC WITH DIFFERENTIAL/PLATELET
Abs Immature Granulocytes: 0.18 10*3/uL — ABNORMAL HIGH (ref 0.00–0.07)
Basophils Absolute: 0 10*3/uL (ref 0.0–0.1)
Basophils Relative: 0 %
Eosinophils Absolute: 0 10*3/uL (ref 0.0–0.5)
Eosinophils Relative: 0 %
HCT: 44.1 % (ref 39.0–52.0)
Hemoglobin: 14.2 g/dL (ref 13.0–17.0)
Immature Granulocytes: 1 %
Lymphocytes Relative: 7 %
Lymphs Abs: 1.1 10*3/uL (ref 0.7–4.0)
MCH: 29.4 pg (ref 26.0–34.0)
MCHC: 32.2 g/dL (ref 30.0–36.0)
MCV: 91.3 fL (ref 80.0–100.0)
Monocytes Absolute: 0.7 10*3/uL (ref 0.1–1.0)
Monocytes Relative: 5 %
Neutro Abs: 13.1 10*3/uL — ABNORMAL HIGH (ref 1.7–7.7)
Neutrophils Relative %: 87 %
Platelets: 209 10*3/uL (ref 150–400)
RBC: 4.83 MIL/uL (ref 4.22–5.81)
RDW: 14.2 % (ref 11.5–15.5)
WBC: 15.1 10*3/uL — ABNORMAL HIGH (ref 4.0–10.5)
nRBC: 0 % (ref 0.0–0.2)

## 2021-06-18 LAB — BASIC METABOLIC PANEL
Anion gap: 8 (ref 5–15)
BUN: 21 mg/dL (ref 8–23)
CO2: 20 mmol/L — ABNORMAL LOW (ref 22–32)
Calcium: 8.3 mg/dL — ABNORMAL LOW (ref 8.9–10.3)
Chloride: 106 mmol/L (ref 98–111)
Creatinine, Ser: 1.14 mg/dL (ref 0.61–1.24)
GFR, Estimated: >60 mL/min (ref >60)
Glucose, Bld: 119 mg/dL — ABNORMAL HIGH (ref 70–99)
Potassium: 3.9 mmol/L (ref 3.5–5.1)
Sodium: 134 mmol/L — ABNORMAL LOW (ref 135–145)

## 2021-06-18 MED ORDER — LORAZEPAM 2 MG/ML IJ SOLN
1.0000 mg | Freq: Once | INTRAMUSCULAR | Status: AC
  Administered 2021-06-19: 1 mg via INTRAVENOUS

## 2021-06-18 MED ORDER — POTASSIUM CHLORIDE CRYS ER 20 MEQ PO TBCR
EXTENDED_RELEASE_TABLET | ORAL | Status: AC
  Filled 2021-06-18: qty 2

## 2021-06-18 MED ORDER — POTASSIUM CHLORIDE CRYS ER 20 MEQ PO TBCR
20.0000 meq | EXTENDED_RELEASE_TABLET | Freq: Once | ORAL | Status: AC
Start: 2021-06-18 — End: 2021-06-18
  Administered 2021-06-18: 20 meq via ORAL

## 2021-06-18 NOTE — TOC Benefit Eligibility Note (Signed)
Patient Product/process development scientist completed.    The patient is currently admitted and upon discharge could be taking Brilinta 90 mg.  The current 30 day co-pay is, $195.00 due to a $95.00 deductible remaining.   The patient is insured through [place] Medicare Part D     Stephen Hull, CPhT Pharmacy Patient Advocate Specialist [facility] Patient Advocate Team Direct Number: [phone]  Fax: [phone]

## 2021-06-18 NOTE — Anesthesia Postprocedure Evaluation (Signed)
Anesthesia Post Note  Patient: Stephen Hull  Procedure(s) Performed: IR WITH ANESTHESIA     Patient location during evaluation: PACU Anesthesia Type: General Level of consciousness: awake and alert Pain management: pain level controlled Vital Signs Assessment: post-procedure vital signs reviewed and stable Respiratory status: spontaneous breathing, nonlabored ventilation, respiratory function stable and patient connected to nasal cannula oxygen Cardiovascular status: blood pressure returned to baseline and stable Postop Assessment: no apparent nausea or vomiting Anesthetic complications: no   No notable events documented.  Last Vitals:  Vitals:   06/18/21 0700 06/18/21 0800  BP: 132/76 126/71  Pulse: 83 75  Resp: 18 (!) 22  Temp:  36.6 C  SpO2: 95% 97%    Last Pain:  Vitals:   06/18/21 0800  TempSrc: Oral  PainSc:                  Beryle Lathe

## 2021-06-18 NOTE — Progress Notes (Signed)
[facility] PROGRESS NOTE   ZACHARIA SOWLES OVF:[id number] DOB: 1945-11-14 DOA: 06/13/2021  PCP: Richmond Campbell., PA-C  Brief History/Interval Summary: Sherif Millspaugh  is a 75 y.o. male who is a reformed smoker (quit in 1991) with past medical history relevant for HTN, HLD, PSVT as well as history of chronic asymptomatic bradycardia and history of left carotid artery stenosis presented to the ED with sudden onset of right eye vision loss and headache starting around 4:30 AM on day of admission. He reports intermittent loss of vision of the inferior visual field of the right eye for the past 2 days. Lately patient has been having intermittent right facial and right upper extremity numbness and tingling and he was undergoing work-up at the [facility] and with his PCP at [facility]. EDP discussed case with on-call neurologist recommended transfer to [facility] for further neurology work-up and MRI. In ED ocular ultrasound was done by EDP see documentation in the EDP (Dr Acey Lav)  notes  Patient was transferred to [facility] for further work-up  Reason for Visit: Acute stroke  Consultants: Neurology  Procedures:  Transthoracic echocardiogram  Cerebral angiogram 11/25  Right ICA stenting 11/28    Subjective/Interval History: Patient slept well overnight.  Denies any headaches.  No change in vision in his right eye.  No change in strength in his upper or lower extremities.  Feels well.  His wife is at the bedside.    Assessment/Plan:  Acute vision loss/acute stroke Patient has undergone MRI brain, MRA head and neck, transthoracic echocardiogram.  Echocardiogram shows normal systolic function.  No embolic source identified. 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 Patient was initially placed on aspirin and Plavix.  Subsequently loaded with Brilinta.  Plavix was discontinued. Currently on aspirin and Brilinta. Underwent cerebral angiogram which  showed severe stenosis of proximal right ICA with antegrade flow to the terminal right ICA.  Questionable focal dissection just distal to the above.  Patent distal right ICA intracranially.  Patient underwent stenting of the right ICA on 11/28.   LDL is 85.  Patient is on statin. HbA1c is 5.4.  TSH 3.2.  Urine drug screen unremarkable. Leukocytosis is likely reactive.  He is afebrile.  Patient to follow-up with an ophthalmologist.  This was discussed with him.  He mentions that he will follow-up with Dr. Nile Riggs. Seen by physical therapy and does not require any kind of follow-up.  Essential hypertension Blood pressure is reasonably well controlled.  Noted to be on acebutolol.  Electrolytes are stable.  History of PSVT Stable currently.  Continue beta-blocker.  EKG shows ventricular bigeminy.  Asymptomatic.  CKD stage II Renal function at baseline.  Obesity Estimated body mass index is 35.57 kg/m as calculated from the following:   Height as of this encounter: 5\' 11"  (1.803 m).   Weight as of this encounter: 115.7 kg.    DVT Prophylaxis: Heparin subcutaneous Code Status: Full code Family Communication: Discussed with the patient Disposition Plan: Discharge when cleared by neurology.  Status is: Inpatient  Remains inpatient appropriate because: Acute stroke.  ICA stenosis status post stenting.     Medications: Scheduled:  acebutolol  200 mg Oral BID   aspirin  81 mg Oral Daily   Or   aspirin  81 mg Per Tube Daily   atorvastatin  80 mg Oral Daily   Chlorhexidine Gluconate Cloth  6 each Topical Daily   cholecalciferol  2,000 Units Oral Daily  heparin  5,000 Units Subcutaneous Q8H   pantoprazole  40 mg Oral Daily   potassium chloride  20 mEq Oral Once   sodium chloride flush  3 mL Intravenous Q12H   ticagrelor  90 mg Oral BID   Or   ticagrelor  90 mg Per Tube BID   Continuous:  sodium chloride     sodium chloride 10 mL/hr at 06/17/21 1157   sodium chloride 75 mL/hr  at 06/18/21 0700   clevidipine 4 mg/hr (06/18/21 0817)   [id number] chloride, acetaminophen **OR** acetaminophen (TYLENOL) oral liquid 160 mg/5 mL **OR** acetaminophen, ALPRAZolam, bisacodyl, hydrALAZINE, ondansetron **OR** ondansetron (ZOFRAN) IV, polyethylene glycol, sodium chloride flush, traZODone  Antibiotics: Anti-infectives (From admission, onward)    Start     Dose/Rate Route Frequency Ordered Stop   06/17/21 1326  ceFAZolin (ANCEF) 2-4 GM/100ML-% IVPB       Note to Pharmacy: Kandy Garrison   : cabinet override      06/17/21 1326 06/18/21 0129       Objective:  Vital Signs  Vitals:   06/18/21 0545 06/18/21 0600 06/18/21 0700 06/18/21 0800  BP: 120/75 121/77 132/76 126/71  Pulse: 81 83 83 75  Resp: (!) 21 16 18  (!) 22  Temp:    97.9 F (36.6 C)  TempSrc:    Oral  SpO2: 96% 96% 95% 97%  Weight:      Height:        Intake/Output Summary (Last 24 hours) at 06/18/2021 0845 Last data filed at 06/18/2021 0700 Gross per 24 hour  Intake 1642.63 ml  Output 550 ml  Net 1092.63 ml    Filed Weights   06/13/21 0647  Weight: 115.7 kg    General appearance: Awake alert.  In no distress Resp: Clear to auscultation bilaterally.  Normal effort Cardio: S1-S2 is normal regular.  No S3-S4.  No rubs murmurs or bruit GI: Abdomen is soft.  Nontender nondistended.  Bowel sounds are present normal.  No masses organomegaly Extremities: No edema.  Full range of motion of lower extremities. Neurologic: Alert and oriented x3.  No focal neurological deficits.     Lab Results:  Data Reviewed: I have personally reviewed following labs and imaging studies  CBC: Recent Labs  Lab 06/13/21 0725 06/13/21 0731 06/14/21 0219 06/16/21 0152 06/17/21 0919 06/18/21 0538  WBC 11.6*  --  12.7* 13.0* 11.9* 15.1*  NEUTROABS 7.9*  --   --   --   --  13.1*  HGB 15.0 16.0 14.4 15.6 15.5 14.2  HCT 45.9 47.0 44.6 47.1 47.1 44.1  MCV 92.7  --  92.1 91.1 91.1 91.3  PLT 192  --  188 193  194 209     Basic Metabolic Panel: Recent Labs  Lab 06/13/21 0725 06/13/21 0731 06/14/21 0219 06/14/21 2347 06/16/21 0152 06/18/21 0538  NA 139 142 138  --  138 134*  K 3.9 4.1 3.9  --  3.5 3.9  CL 106 104 106  --  106 106  CO2 25  --  26  --  24 20*  GLUCOSE 106* 105* 103*  --  100* 119*  BUN 23 23 19   --  19 21  CREATININE 1.25* 1.20 1.33*  --  1.32* 1.14  CALCIUM 8.7*  --  8.8*  --  8.8* 8.3*  MG  --   --  1.9 2.0  --   --      GFR: Estimated Creatinine Clearance: 74.7 mL/min (by C-G formula based on SCr  of 1.14 mg/dL).  Liver Function Tests: Recent Labs  Lab 06/13/21 0725  AST 23  ALT 27  ALKPHOS 61  BILITOT 1.0  PROT 6.7  ALBUMIN 3.8      Coagulation Profile: Recent Labs  Lab 06/13/21 0725 06/17/21 0919  INR 1.0 1.1       Recent Results (from the past 240 hour(s))  Resp Panel by RT-PCR (Flu A&B, Covid) Nasopharyngeal Swab     Status: None   Collection Time: 06/13/21  7:42 AM   Specimen: Nasopharyngeal Swab; Nasopharyngeal(NP) swabs in vial transport medium  Result Value Ref Range Status   SARS Coronavirus 2 by RT PCR NEGATIVE NEGATIVE Final    Comment: (NOTE) SARS-CoV-2 target nucleic acids are NOT DETECTED.  The SARS-CoV-2 RNA is generally detectable in upper respiratory specimens during the acute phase of infection. The lowest concentration of SARS-CoV-2 viral copies this assay can detect is 138 copies/mL. A negative result does not preclude SARS-Cov-2 infection and should not be used as the sole basis for treatment or other patient management decisions. A negative result may occur with  improper specimen collection/handling, submission of specimen other than nasopharyngeal swab, presence of viral mutation(s) within the areas targeted by this assay, and inadequate number of viral copies(<138 copies/mL). A negative result must be combined with clinical observations, patient history, and epidemiological information. The expected result is  Negative.  Fact Sheet for Patients:  BloggerCourse.comhttps://www.fda.gov/media/152166/download  Fact Sheet for Healthcare Providers:  SeriousBroker.ithttps://www.fda.gov/media/152162/download  This test is no t yet approved or cleared by the [place] FDA and  has been authorized for detection and/or diagnosis of SARS-CoV-2 by FDA under an Emergency Use Authorization (EUA). This EUA will remain  in effect (meaning this test can be used) for the duration of the COVID-19 declaration under Section 564(b)(1) of the Act, 21 U.S.C.section 360bbb-3(b)(1), unless the authorization is terminated  or revoked sooner.       Influenza A by PCR NEGATIVE NEGATIVE Final   Influenza B by PCR NEGATIVE NEGATIVE Final    Comment: (NOTE) The Xpert Xpress SARS-CoV-2/FLU/RSV plus assay is intended as an aid in the diagnosis of influenza from Nasopharyngeal swab specimens and should not be used as a sole basis for treatment. Nasal washings and aspirates are unacceptable for Xpert Xpress SARS-CoV-2/FLU/RSV testing.  Fact Sheet for Patients: BloggerCourse.comhttps://www.fda.gov/media/152166/download  Fact Sheet for Healthcare Providers: SeriousBroker.ithttps://www.fda.gov/media/152162/download  This test is not yet approved or cleared by the [place] FDA and has been authorized for detection and/or diagnosis of SARS-CoV-2 by FDA under an Emergency Use Authorization (EUA). This EUA will remain in effect (meaning this test can be used) for the duration of the COVID-19 declaration under Section 564(b)(1) of the Act, 21 U.S.C. section 360bbb-3(b)(1), unless the authorization is terminated or revoked.  Performed at [facility], [address] [place], [place] [address]   Surgical pcr screen     Status: None   Collection Time: 06/17/21  5:20 AM   Specimen: Nasal Mucosa; Nasal Swab  Result Value Ref Range Status   MRSA, PCR NEGATIVE NEGATIVE Final   Staphylococcus aureus NEGATIVE NEGATIVE Final    Comment: (NOTE) The Xpert SA Assay (FDA approved for  NASAL specimens in patients 73 years of age and older), is one component of a comprehensive surveillance program. It is not intended to diagnose infection nor to guide or monitor treatment. Performed at [facility], [address] [place], [place] [address]        Radiology Studies: No results  found.     LOS: 3 days   Elfida Shimada [employer] Pager on [email or web address]  06/18/2021, 8:45 AM
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
## 2021-06-18 NOTE — Discharge Instructions (Addendum)
Information about your medication: Brilinta (anti-platelet agent)  Generic Name (Brand): ticagrelor (Brilinta), twice daily medication  PURPOSE: You are taking this medication along with aspirin to lower your chance of having a heart attack, stroke, or blood clots in your stent. These can be fatal. Brilinta and aspirin help prevent platelets from sticking together and forming a clot that can block an artery or your stent.   Common SIDE EFFECTS you may experience include: bruising or bleeding more easily, shortness of breath  Do not stop taking BRILINTA without talking to the doctor who prescribes it for you. People who are treated with a stent and stop taking Brilinta too soon, have a higher risk of getting a blood clot in the stent, having a heart attack, or dying. If you stop Brilinta because of bleeding, or for other reasons, your risk of a heart attack or stroke may increase.   Avoid taking NSAID agents or anti-inflammatory medications such as ibuprofen, naproxen given increased bleed risk with plavix - can use acetaminophen (Tylenol) if needed for pain.  Tell all of your doctors and dentists that you are taking Brilinta. They should talk to the doctor who prescribed Brilinta for you before you have any surgery or invasive procedure.   Contact your health care provider if you experience: severe or uncontrollable bleeding, pink/red/brown urine, vomiting blood or vomit that looks like "coffee grounds", red or black stools (looks like tar), coughing up blood or blood clots ----------------------------------------------------------------------------------------------------------------------    Femoral Site Care This sheet gives you information about how to care for yourself after your procedure. Your health care provider may also give you more specific instructions. If you have problems or questions, contact your health care provider. What can I expect after the procedure? After the procedure,  it is common to have: Bruising that usually fades within 1-2 weeks. Tenderness at the site. Follow these instructions at home: Wound care Follow instructions from your health care provider about how to take care of your insertion site. Make sure you: Wash your hands with soap and water before you change your bandage (dressing). If soap and water are not available, use hand sanitizer. Change your dressing as directed- pressure dressing removed 24 hours post-procedure (and switch for bandaid), bandaid removed 72 hours post-procedure Do not take baths, swim, or use a hot tub for 7 days post-procedure. You may shower 48 hours after the procedure or as told by your health care provider. Gently wash the site with plain soap and water. Pat the area dry with a clean towel. Do not rub the site. This may cause bleeding. Check your site every day for signs of infection. Check for: Redness, swelling, or pain. Fluid or blood. Warmth. Pus or a bad smell. Activity Do not stoop, bend, or lift anything that is heavier than 10 lb (4.5 kg) for 2 weeks post-procedure. Do not drive self for 2 weeks post-procedure. Contact a health care provider if you have: A fever or chills. You have redness, swelling, or pain around your insertion site. Get help right away if: The catheter insertion area swells very fast. You pass out. You suddenly start to sweat or your skin gets clammy. The catheter insertion area is bleeding, and the bleeding does not stop when you hold steady pressure on the area. The area near or just beyond the catheter insertion site becomes pale, cool, tingly, or numb. These symptoms may represent a serious problem that is an emergency. Do not wait to see if the symptoms will  go away. Get medical help right away. Call your local emergency services (911 in the [place] Do not drive yourself to the hospital.  This information is not intended to replace advice given to you by your health care  provider. Make sure you discuss any questions you have with your health care provider. Document Revised: 07/20/2017 Document Reviewed: 07/20/2017 Elsevier Patient Education  2020 [place] have been placed on aspirin and Brilinta, please take that daily, you were given a month worth of medication, please reach out to the neurologist and PCP regarding further refills.  The duration of treatment will be determined by neurology as an outpatient

## 2021-06-18 NOTE — Evaluation (Signed)
Physical Therapy Evaluation Patient Details Name: Stephen Hull MRN: [id number] DOB: [birth year] Today's Date: 06/18/2021  History of Present Illness  Pt is a 75 y/o male who presents with R eye vision loss. MRI revealed a small acute infarct in the right caudate body, questionable punctate acute white matter infarct in the posterior right frontal lobe. Pt is s/p R ICA stent on 11/28. Had a clot formation that migrated to R MCA so subsequently right MCA thrombectomy was done with TICI3 revascularization.  PMH significant for bradycardia, HTN, paroxysmal SVT.  Clinical Impression  Pt re-evaluated post procedure at MD request. Continues to demonstrate R eye deficits, but compensated well. Used R visual scanning techniques without cues. Mod I to independent with all mobility tasks. No LOB noted. Reviewed safety with mobility at home. No further acute PT needs at this time. Will sign off. If needs change, please re-consult.        Recommendations for follow up therapy are one component of a multi-disciplinary discharge planning process, led by the attending physician.  Recommendations may be updated based on patient status, additional functional criteria and insurance authorization.  Follow Up Recommendations No PT follow up    Assistance Recommended at Discharge PRN  Functional Status Assessment Patient has had a recent decline in their functional status and demonstrates the ability to make significant improvements in function in a reasonable and predictable amount of time.  Equipment Recommendations  None recommended by PT    Recommendations for Other Services       Precautions / Restrictions Precautions Precautions: Fall Restrictions Weight Bearing Restrictions: No      Mobility  Bed Mobility Overal bed mobility: Independent                  Transfers Overall transfer level: Independent Equipment used: None                    Ambulation/Gait Ambulation/Gait  assistance: Modified independent (Device/Increase time) Gait Distance (Feet): 200 Feet Assistive device: None Gait Pattern/deviations: WFL(Within Functional Limits) Gait velocity: Mildly decreased     General Gait Details: Pt familiar with R sided scanning technique and performed throughout mobility in hallway without cues. No LOB noted. Educated about cutting lights on at home to increase safety whenever up and moving.  Stairs            Wheelchair Mobility    Modified Rankin (Stroke Patients Only) Modified Rankin (Stroke Patients Only) Pre-Morbid Rankin Score: No symptoms Modified Rankin: Slight disability     Balance Overall balance assessment: Mild deficits observed, not formally tested                                           Pertinent Vitals/Pain Pain Assessment: No/denies pain    Home Living Family/patient expects to be discharged to:: Private residence Living Arrangements: Spouse/significant other Available Help at Discharge: Family;Available 24 hours/day Type of Home: House Home Access: Ramped entrance       Home Layout: One level Home Equipment: Agricultural consultant (2 wheels);Shower seat - built in;Grab bars - toilet;Grab bars - tub/shower;Adaptive equipment;Cane - single point;Toilet riser      Prior Function Prior Level of Function : Independent/Modified Independent;Driving;Working/employed;History of Falls (last six months)                     Hand Dominance  Dominant Hand: Right    Extremity/Trunk Assessment   Upper Extremity Assessment Upper Extremity Assessment: Overall WFL for tasks assessed    Lower Extremity Assessment Lower Extremity Assessment: Overall WFL for tasks assessed    Cervical / Trunk Assessment Cervical / Trunk Assessment: Normal  Communication   Communication: No difficulties  Cognition Arousal/Alertness: Awake/alert Behavior During Therapy: WFL for tasks assessed/performed Overall Cognitive  Status: Within Functional Limits for tasks assessed                                          General Comments      Exercises     Assessment/Plan    PT Assessment Patient does not need any further PT services  PT Problem List         PT Treatment Interventions      PT Goals (Current goals can be found in the Care Plan section)  Acute Rehab PT Goals Patient Stated Goal: to go home PT Goal Formulation: With patient Time For Goal Achievement: 06/18/21 Potential to Achieve Goals: Good    Frequency     Barriers to discharge        Co-evaluation               AM-PAC PT "6 Clicks" Mobility  Outcome Measure Help needed turning from your back to your side while in a flat bed without using bedrails?: None Help needed moving from lying on your back to sitting on the side of a flat bed without using bedrails?: None Help needed moving to and from a bed to a chair (including a wheelchair)?: None Help needed standing up from a chair using your arms (e.g., wheelchair or bedside chair)?: None Help needed to walk in hospital room?: None Help needed climbing 3-5 steps with a railing? : A Little 6 Click Score: 23    End of Session   Activity Tolerance: Patient tolerated treatment well Patient left: in bed;with call bell/phone within reach;with family/visitor present Nurse Communication: Mobility status PT Visit Diagnosis: Other symptoms and signs involving the nervous system (N27.782)    Time: 4235-3614 PT Time Calculation (min) (ACUTE ONLY): 17 min   Charges:   PT Evaluation $PT Eval Low Complexity: 1 Low          Cindee Salt, DPT  Acute Rehabilitation Services  Pager: [phone] Office: [phone]   [facility] Prom 06/18/2021, 2:13 PM

## 2021-06-18 NOTE — Progress Notes (Signed)
Orthopedic Tech Progress Note Patient Details:  Stephen Hull 07-26-1945 [id number]  RN stated" patient has SOFT COLLAR"  Patient ID: Stephen Hull, male   DOB: [birth year], 75 y.o.   MRN: [id number]  Stephen Hull 06/18/2021, 8:32 AM
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
## 2021-06-19 ENCOUNTER — Inpatient Hospital Stay (HOSPITAL_COMMUNITY): Payer: Medicare HMO

## 2021-06-19 ENCOUNTER — Other Ambulatory Visit (HOSPITAL_COMMUNITY): Payer: Self-pay

## 2021-06-19 DIAGNOSIS — I63231 Cerebral infarction due to unspecified occlusion or stenosis of right carotid arteries: Secondary | ICD-10-CM

## 2021-06-19 IMAGING — MR MR HEAD W/O CM
6 of 10 series · 28 of 48 positions shown · non-contrast
Comparison: MR/MRA head [DATE], intra procedural images [WQ]

CLINICAL DATA: Status post right ICA stent complicated by migrating
right MCA thrombus following thrombectomy

EXAM:
MRI HEAD WITHOUT CONTRAST
TECHNIQUE: Multiplanar, multiecho pulse sequences of the brain and surrounding
structures were obtained without intravenous contrast.

[Series 2: DWI · axial · 3.0mm · 0.94mm/px · z∈[-48,+97]mm · 8 of 100 slices shown (1 of 2)]
[im 1/100]
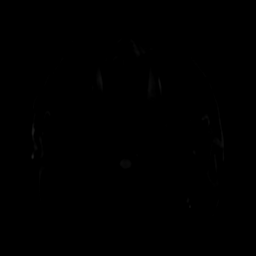
[im 12/100]
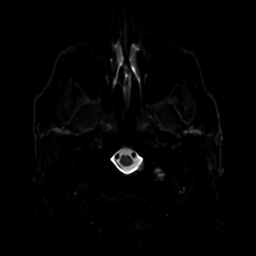
[im 34/100]
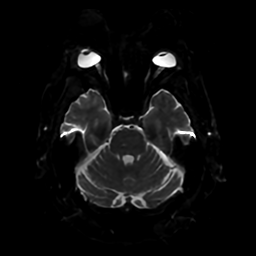
[im 45/100]
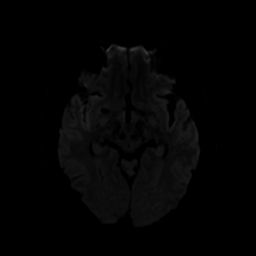
[im 56/100]
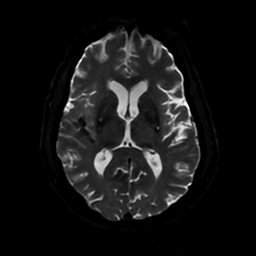
[im 67/100]
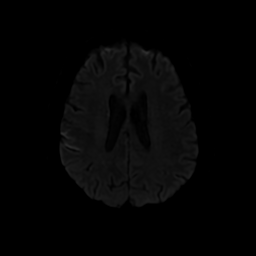
[im 89/100]
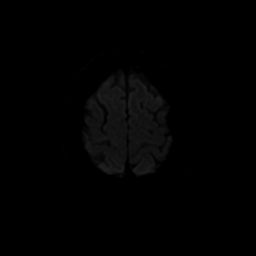
[im 100/100]
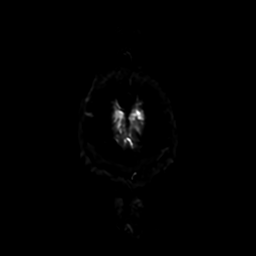

[Series 3: DWI · coronal · 4.0mm · 0.94mm/px · 7 of 72 slices shown (2 of 2)]
[im 1/72]
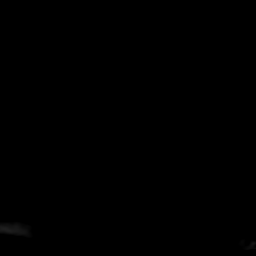
[im 12/72]
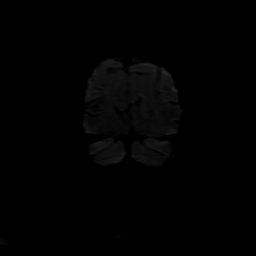
[im 24/72]
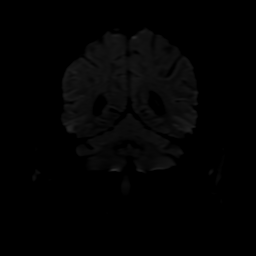
[im 36/72]
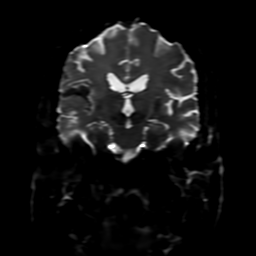
[im 48/72]
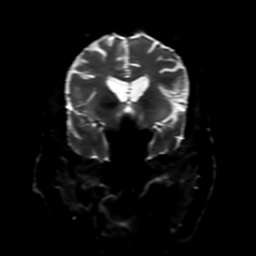
[im 60/72]
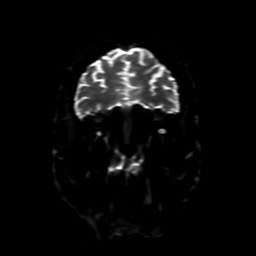
[im 72/72]
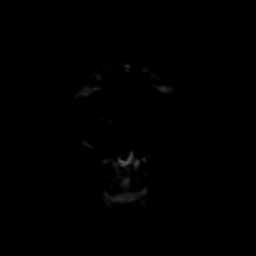

[Series 4: FLAIR · sagittal · 5.0mm · 0.23mm/px · 2 of 24 slices shown (1 of 2)]
[im 1/24]
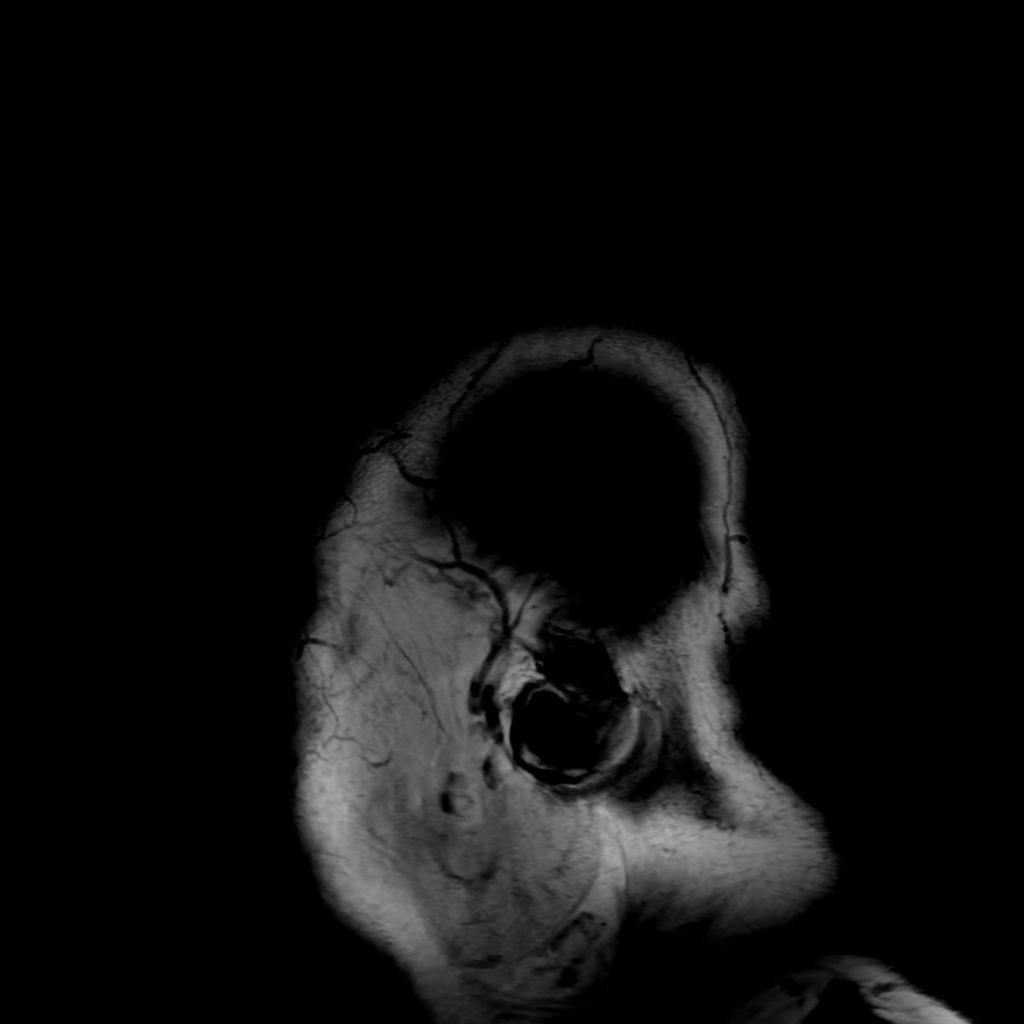
[im 24/24]
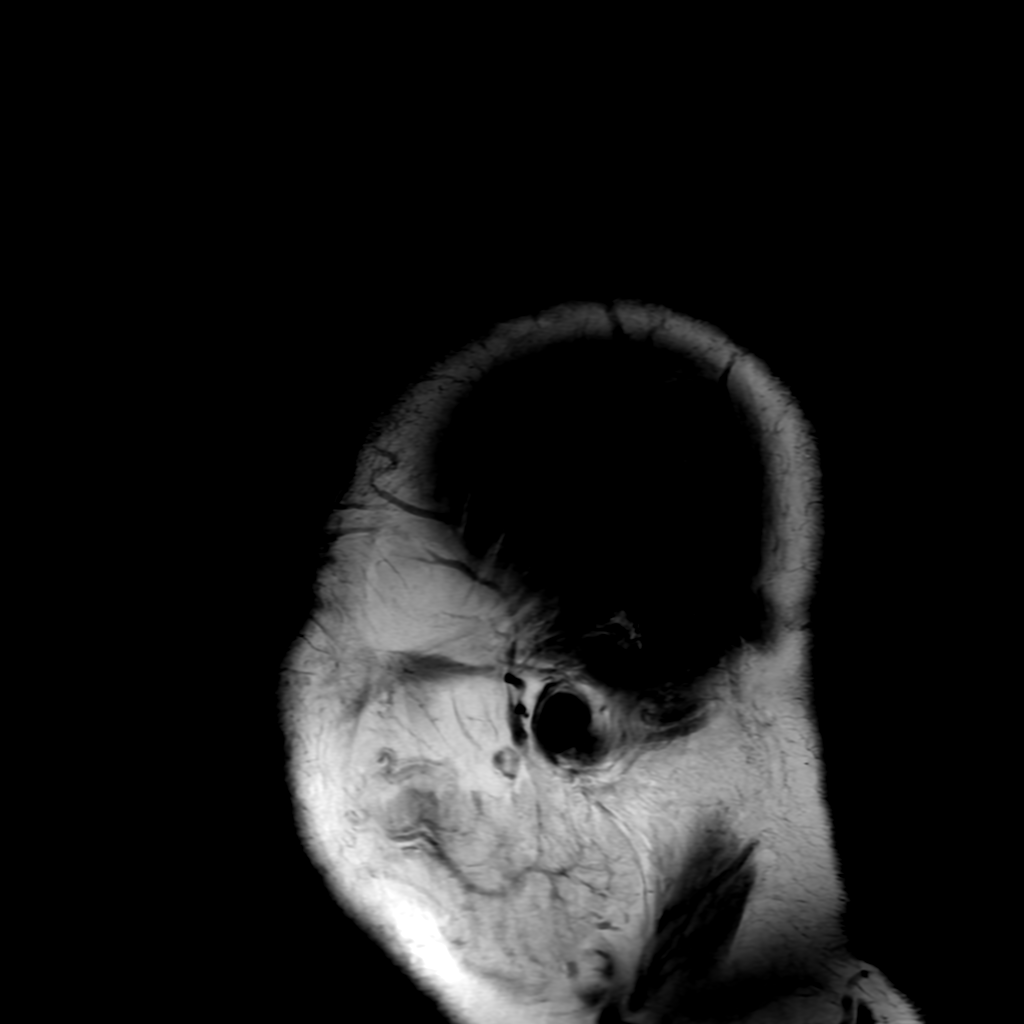

[Series 6: FLAIR · axial · 4.0mm · 0.45mm/px · z∈[-48,+100]mm · 3 of 35 slices shown (2 of 2)]
[im 1/35]
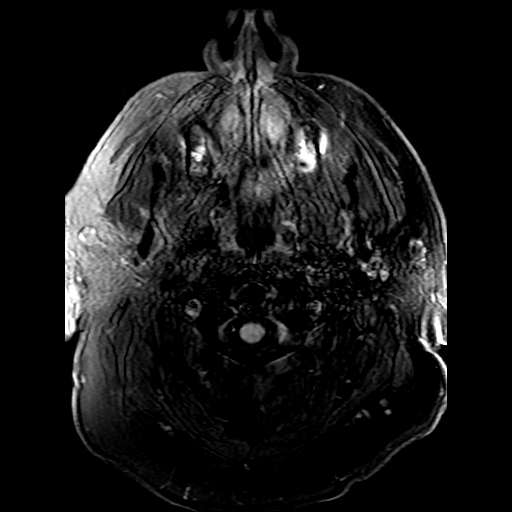
[im 18/35]
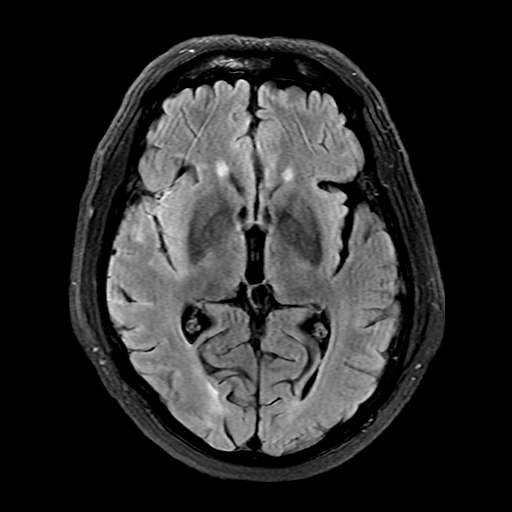
[im 35/35]
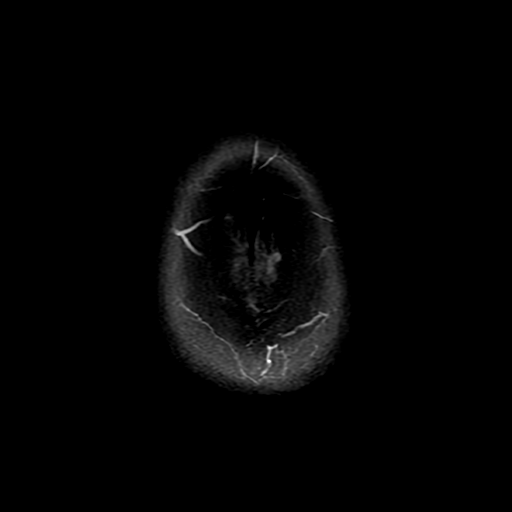

[Series 250: ADC · axial · 3.0mm · 0.94mm/px · z∈[-48,+97]mm · 5 of 50 slices shown (1 of 2)]
[im 1/50]
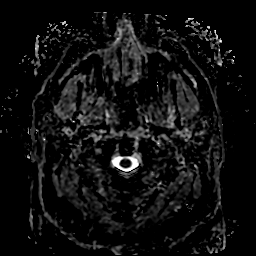
[im 13/50]
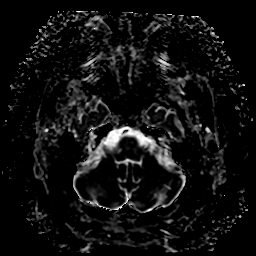
[im 25/50]
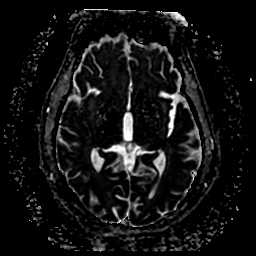
[im 37/50]
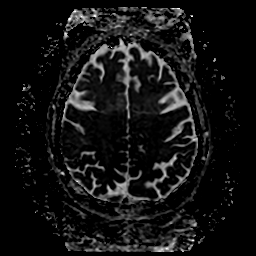
[im 50/50]
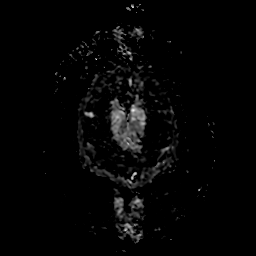

[Series 350: ADC · coronal · 4.0mm · 0.94mm/px · 3 of 36 slices shown (2 of 2)]
[im 1/36]
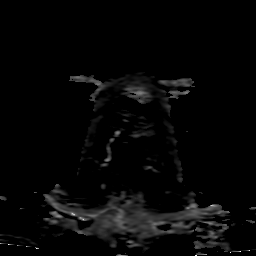
[im 18/36]
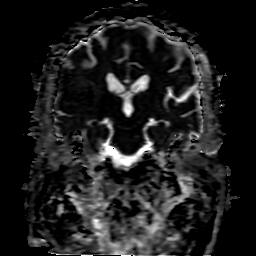
[im 36/36]
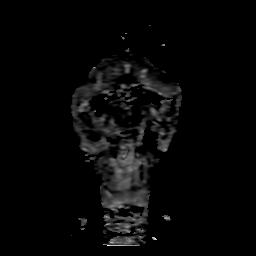

[28 of 48 positions shown; findings below may reference images not displayed]

FINDINGS: Brain: There is a punctate focus of diffusion restriction in the
right caudate body consistent with small evolving infarct as seen on
the prior MRI from [DATE]. Additional punctate cortical infarcts
are seen in the right frontal, parietal, and occipital lobes, new
since [DATE]. Faint diffusion restriction and FLAIR signal
abnormality is also seen involving the insular cortex and right
posterolateral frontal lobe cortex consistent with ischemia

There is sulcal FLAIR signal abnormality and SWI signal dropout
overlying the right frontal and parietal lobes and sylvian fissure
consistent with subarachnoid hemorrhage as seen on the intra
procedural CT, likely not significantly changed in volume.

The ventricles are normal in size. There is mild chronic white
matter microangiopathy. Remote lacunar infarcts in the left
lentiform nucleus and cerebellar hemisphere are unchanged.

There is no solid mass lesion.  There is no midline shift.

Vascular: The major intracranial flow voids are present.
Specifically, the right ICA flow void is now present.

Skull and upper cervical spine: Normal marrow signal.

Sinuses/Orbits: The paranasal sinuses are clear. The globes and
orbits are unremarkable.

Other: None.
IMPRESSION: 1. Mild cortical diffusion restriction and FLAIR signal abnormality
in the right MCA distribution as described above consistent with
evolving ischemia.
2. The punctate infarct in the right caudate body seen on the study
from [DATE] is not significantly changed. Multiple additional
punctate cortical infarcts are seen in the right frontal, parietal,
and occipital lobes consistent with additional punctate infarcts,
new since [DATE].
[DATE]. Small volume subarachnoid hemorrhage over the right cerebral
hemisphere, likely not significantly changed compared to the intra
procedural images allowing for difference in modality.
4. Interval recanalization of the right ICA which now demonstrates a
normal flow void.

## 2021-06-19 MED ORDER — ASPIRIN 81 MG PO CHEW
81.0000 mg | CHEWABLE_TABLET | Freq: Every day | ORAL | 0 refills | Status: DC
Start: 2021-06-19 — End: 2023-10-02
  Filled 2021-06-19: qty 30, 30d supply, fill #0

## 2021-06-19 MED ORDER — TICAGRELOR 90 MG PO TABS
90.0000 mg | ORAL_TABLET | Freq: Two times a day (BID) | ORAL | 0 refills | Status: DC
  Filled 2021-06-19: qty 60, 30d supply, fill #0

## 2021-06-19 MED ORDER — ATORVASTATIN CALCIUM 80 MG PO TABS
80.0000 mg | ORAL_TABLET | Freq: Every day | ORAL | 0 refills | Status: AC
  Filled 2021-06-19: qty 30, 30d supply, fill #0

## 2021-06-19 NOTE — TOC Transition Note (Signed)
Transition of Care [facility]) - CM/SW Discharge Note   Patient Details  Name: Stephen Hull MRN: [id number] Date of Birth: 11-11-1945  [facility] CM/SW Contact:  Kermit Balo, RN Phone Number: 06/19/2021, 9:09 AM   Clinical Narrative:    Patient is discharging home with self care. No f/u per PT/OT and no DME needs.  Pt discharging on Brilinta. TOC pharmacy to deliver his medications for d/c to the room and will apply the 30 day coupon.  Pt has transportation home.   Final next level of care: Home/Self Care Barriers to Discharge: No Barriers Identified   Patient Goals and CMS Choice        Discharge Placement                       Discharge Plan and Services                                     Social Determinants of Health (SDOH) Interventions     Readmission Risk Interventions No flowsheet data found.
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
## 2021-06-19 NOTE — Discharge Summary (Signed)
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 and older), is one component of a comprehensive surveillance program. It is not intended to diagnose infection nor to guide or monitor treatment. Performed at [facility], [place]  [place], [place] [address]      Labs: Basic Metabolic Panel: Recent Labs  Lab 06/13/21 0725 06/13/21 0731 06/14/21 0219 06/14/21 2347 06/16/21 0152 06/18/21 0538  NA 139 142 138  --  138 134*  K 3.9 4.1 3.9  --  3.5 3.9  CL 106 104 106  --  106 106  CO2 25  --  26  --  24 20*  GLUCOSE 106* 105* 103*  --  100* 119*  BUN 23 23 19   --  19 21  CREATININE 1.25* 1.20 1.33*  --  1.32* 1.14  CALCIUM 8.7*  --  8.8*  --  8.8* 8.3*  MG  --   --  1.9 2.0  --   --    Liver Function Tests: Recent Labs  Lab 06/13/21 0725  AST 23  ALT 27  ALKPHOS 61  BILITOT 1.0  PROT 6.7  ALBUMIN 3.8   CBC: Recent Labs  Lab 06/13/21 0725 06/13/21 0731 06/14/21 0219 06/16/21 0152 06/17/21 0919 06/18/21 0538  WBC 11.6*  --  12.7* 13.0* 11.9* 15.1*  NEUTROABS 7.9*  --   --   --   --  13.1*  HGB 15.0 16.0 14.4 15.6 15.5 14.2  HCT 45.9 47.0 44.6 47.1 47.1 44.1  MCV 92.7  --  92.1 91.1 91.1 91.3  PLT 192  --  188 193 194 209   CBG: No results for input(s): GLUCAP in the last 168 hours. Hgb A1c No results for input(s): HGBA1C in the last 72 hours. Lipid Profile No results for input(s): CHOL, HDL, LDLCALC, TRIG, CHOLHDL, LDLDIRECT in the last 72 hours. Thyroid function studies No results for input(s): TSH, T4TOTAL, T3FREE, THYROIDAB in the last 72 hours.  Invalid input(s): FREET3 Urinalysis    Component Value Date/Time   COLORURINE YELLOW 06/13/2021 1130   APPEARANCEUR CLEAR 06/13/2021 1130   LABSPEC <1.005 (L) 06/13/2021 1130   PHURINE 7.0 06/13/2021 1130   GLUCOSEU NEGATIVE 06/13/2021 1130   HGBUR NEGATIVE 06/13/2021 1130   BILIRUBINUR NEGATIVE 06/13/2021 1130   KETONESUR NEGATIVE 06/13/2021 1130   PROTEINUR NEGATIVE 06/13/2021 1130   NITRITE NEGATIVE 06/13/2021 1130   LEUKOCYTESUR NEGATIVE 06/13/2021 1130    FURTHER DISCHARGE INSTRUCTIONS:   Get Medicines reviewed and adjusted: Please take all your medications with you for your next visit with your Primary MD    Laboratory/radiological data: Please request your Primary MD to go over all hospital tests and procedure/radiological results at the follow up, please ask your Primary MD to get all Hospital records sent to his/her office.   In some cases, they will be blood work, cultures and biopsy results pending at the time of your discharge. Please request that your primary care M.D. goes through all the records of your hospital data and follows up on  these results.   Also Note the following: If you experience worsening of your admission symptoms, develop shortness of breath, life threatening emergency, suicidal or homicidal thoughts you must seek medical attention immediately by calling 911 or calling your MD immediately  if symptoms less severe.   You must read complete instructions/literature along with all the possible adverse reactions/side effects for all the Medicines you take and that have been prescribed to you. Take any new Medicines after you have completely understood and accpet all the possible adverse reactions/side effects.    Do not drive when taking Pain medications or sleeping medications (Benzodaizepines)   Do not take more than prescribed Pain, Sleep and Anxiety Medications. It is not advisable to combine anxiety,sleep and pain medications without talking with your primary care practitioner   Special Instructions: If you have smoked or chewed Tobacco  in the last 2 yrs please stop smoking, stop any regular Alcohol  and or any Recreational drug use.   Wear Seat belts while driving.   Please note: You were cared for by a hospitalist during your hospital stay. Once you are discharged, your primary care physician will handle any further medical issues. Please note that NO REFILLS for any discharge medications will be authorized once you are discharged, as it is imperative that you return to your primary care physician (or establish a relationship with a primary care physician if you do not  have one) for your post hospital discharge needs so that they can reassess your need for medications and monitor your lab values.  Time coordinating discharge: 40 minutes  SIGNED:  Pamella Pert, MD, PhD 06/19/2021, 8:52 AM

## 2021-06-19 NOTE — Progress Notes (Signed)
2000- Called MRI to ask when they would come get pt. Per MRI, it would be anytime after midnight. Ativan held for MRI.  0515- Called MRI to ask if they would be coming soon to get pt. Per MRI they were putting him on the schedule and will be up to get him soon. Message relayed to patient, ativan given.

## 2021-06-19 NOTE — Care Management Important Message (Signed)
Important Message  Patient Details  Name: Stephen Hull MRN: [id number] Date of Birth: 06-16-1946   Medicare Important Message Given:  Yes   Patient left prior to IM delivery will mail to the patient home address.   Ronita Hargreaves 06/19/2021, 2:28 PM

## 2021-06-26 ENCOUNTER — Other Ambulatory Visit: Payer: Self-pay

## 2021-06-26 ENCOUNTER — Emergency Department (HOSPITAL_COMMUNITY): Payer: Medicare HMO

## 2021-06-26 ENCOUNTER — Emergency Department (HOSPITAL_COMMUNITY)
Admission: EM | Admit: 2021-06-26 | Discharge: 2021-06-26 | Disposition: A | Discharge status: Home / Self Care | Payer: Medicare HMO | Attending: Emergency Medicine | Admitting: Emergency Medicine

## 2021-06-26 ENCOUNTER — Encounter (HOSPITAL_COMMUNITY): Payer: Self-pay

## 2021-06-26 DIAGNOSIS — Z87891 Personal history of nicotine dependence: Secondary | ICD-10-CM | POA: Diagnosis not present

## 2021-06-26 DIAGNOSIS — I639 Cerebral infarction, unspecified: Secondary | ICD-10-CM | POA: Diagnosis not present

## 2021-06-26 DIAGNOSIS — I10 Essential (primary) hypertension: Secondary | ICD-10-CM | POA: Diagnosis not present

## 2021-06-26 DIAGNOSIS — Z7982 Long term (current) use of aspirin: Secondary | ICD-10-CM | POA: Diagnosis not present

## 2021-06-26 DIAGNOSIS — Z79899 Other long term (current) drug therapy: Secondary | ICD-10-CM | POA: Diagnosis not present

## 2021-06-26 DIAGNOSIS — R202 Paresthesia of skin: Secondary | ICD-10-CM | POA: Diagnosis present

## 2021-06-26 LAB — CBC WITH DIFFERENTIAL/PLATELET
Abs Immature Granulocytes: 0.11 10*3/uL — ABNORMAL HIGH (ref 0.00–0.07)
Basophils Absolute: 0.1 10*3/uL (ref 0.0–0.1)
Basophils Relative: 1 %
Eosinophils Absolute: 0.5 10*3/uL (ref 0.0–0.5)
Eosinophils Relative: 4 %
HCT: 42.9 % (ref 39.0–52.0)
Hemoglobin: 14.1 g/dL (ref 13.0–17.0)
Immature Granulocytes: 1 %
Lymphocytes Relative: 12 %
Lymphs Abs: 1.4 10*3/uL (ref 0.7–4.0)
MCH: 30.8 pg (ref 26.0–34.0)
MCHC: 32.9 g/dL (ref 30.0–36.0)
MCV: 93.7 fL (ref 80.0–100.0)
Monocytes Absolute: 0.9 10*3/uL (ref 0.1–1.0)
Monocytes Relative: 7 %
Neutro Abs: 9 10*3/uL — ABNORMAL HIGH (ref 1.7–7.7)
Neutrophils Relative %: 75 %
Platelets: 216 10*3/uL (ref 150–400)
RBC: 4.58 MIL/uL (ref 4.22–5.81)
RDW: 14.4 % (ref 11.5–15.5)
WBC: 12 10*3/uL — ABNORMAL HIGH (ref 4.0–10.5)
nRBC: 0 % (ref 0.0–0.2)

## 2021-06-26 LAB — BASIC METABOLIC PANEL
Anion gap: 7 (ref 5–15)
BUN: 13 mg/dL (ref 8–23)
CO2: 26 mmol/L (ref 22–32)
Calcium: 9 mg/dL (ref 8.9–10.3)
Chloride: 106 mmol/L (ref 98–111)
Creatinine, Ser: 1.31 mg/dL — ABNORMAL HIGH (ref 0.61–1.24)
GFR, Estimated: 57 mL/min — ABNORMAL LOW (ref >60)
Glucose, Bld: 105 mg/dL — ABNORMAL HIGH (ref 70–99)
Potassium: 4.4 mmol/L (ref 3.5–5.1)
Sodium: 139 mmol/L (ref 135–145)

## 2021-06-26 IMAGING — CT CT HEAD W/O CM
3 series · 14 of 47 positions shown, 16 images · non-contrast
Comparison: Brain MRI [DATE].

CLINICAL DATA: Neuro deficit, acute, stroke suspected. Additional
history provided: Numbness/tingling left side of body, beginning
this morning, recent stroke.

EXAM:
CT HEAD WITHOUT CONTRAST
TECHNIQUE: Contiguous axial images were obtained from the base of the skull
through the vertex without intravenous contrast.

[Series 2: head w o · axial · 0.44mm/px · z∈[+72,+197]mm · 8 of 30 slices shown, 10 images]
[im 3/30  brain]
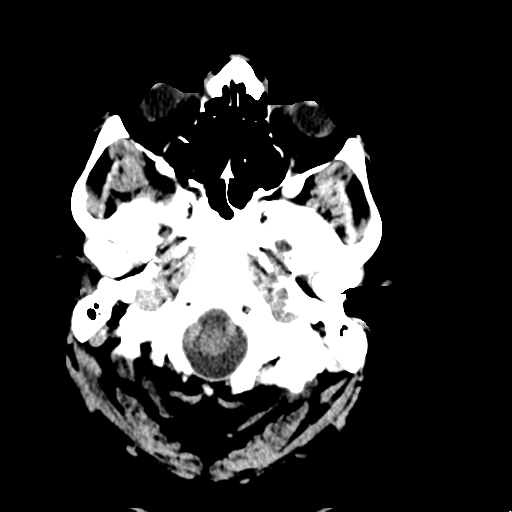
[im 3/30  bone]
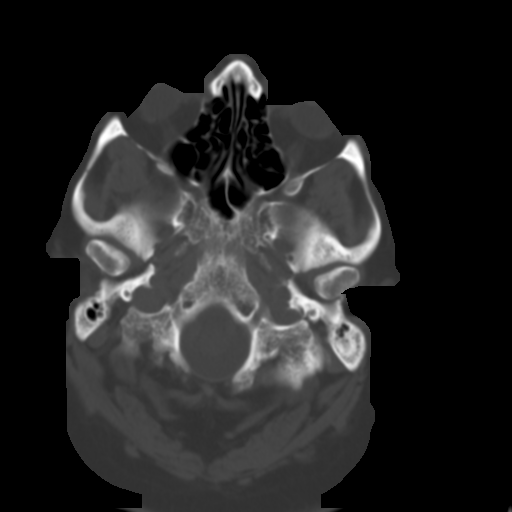
[im 7/30  brain]
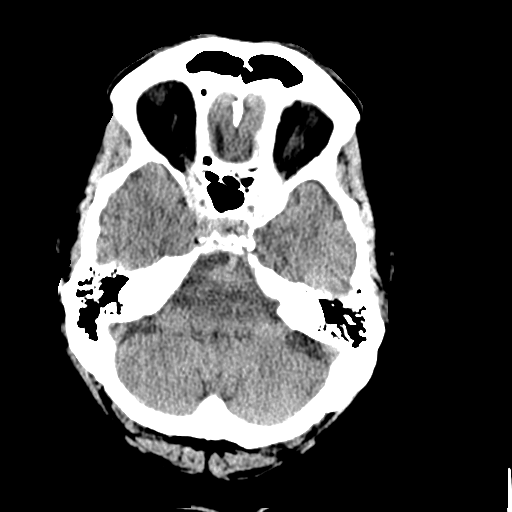
[im 10/30  brain]
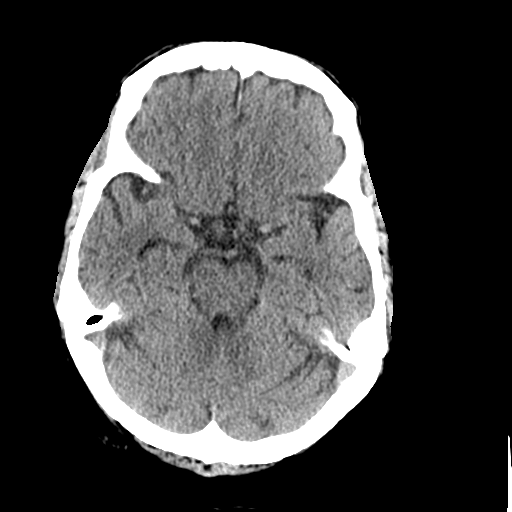
[im 14/30  brain]
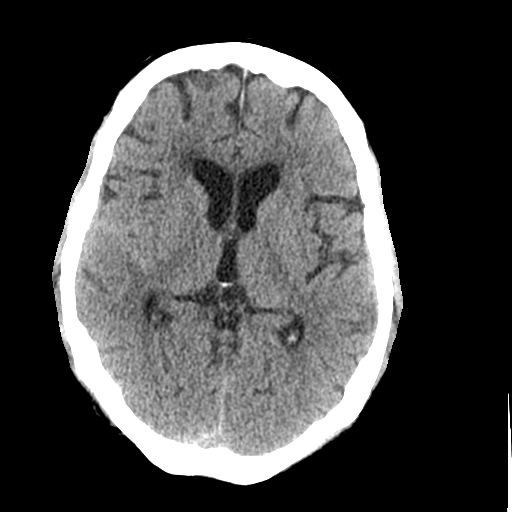
[im 17/30  brain]
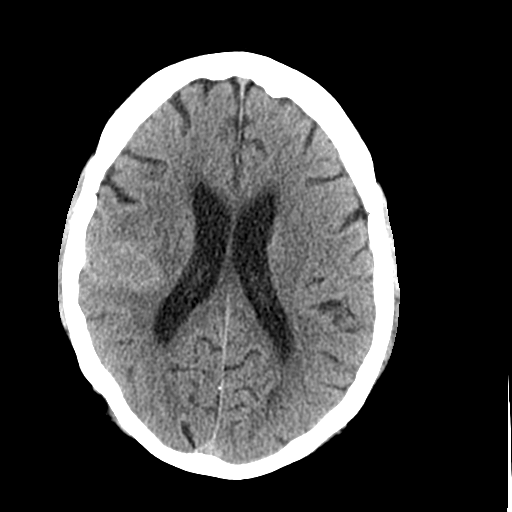
[im 17/30  bone]
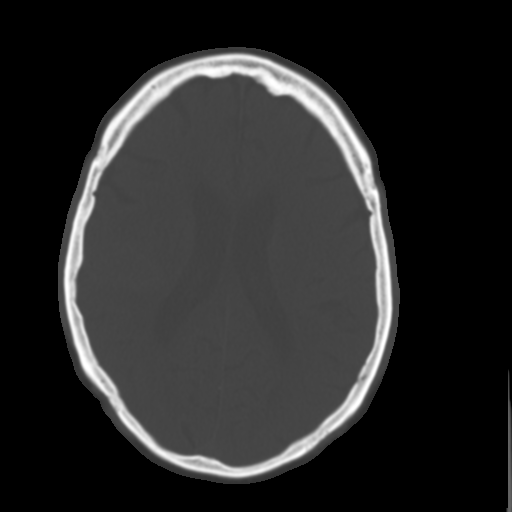
[im 21/30  brain]
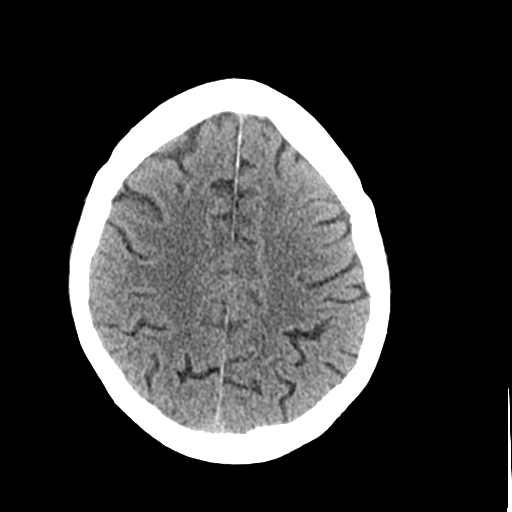
[im 24/30  brain]
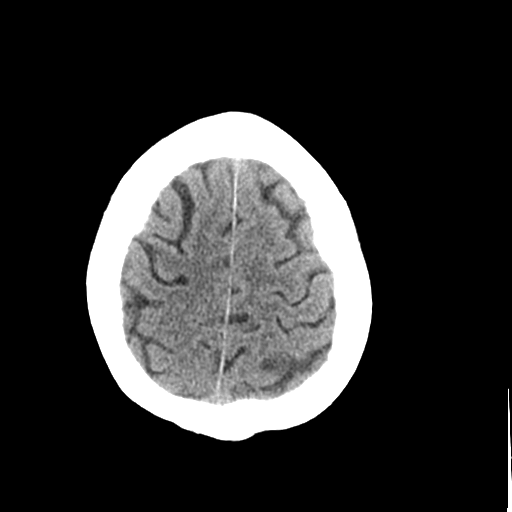
[im 28/30  brain]
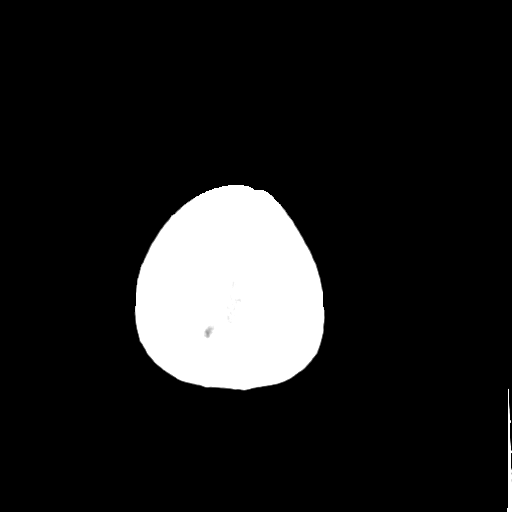

[Series 4: coronal soft · coronal · 0.32mm/px · 3 of 71 slices shown]
[im 24/71  brain]
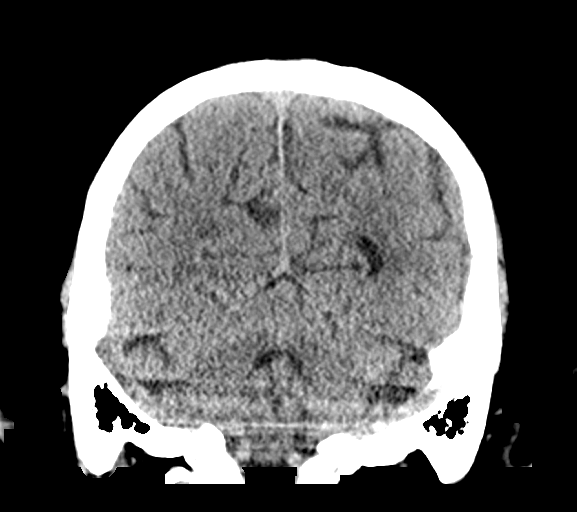
[im 32/71  brain]
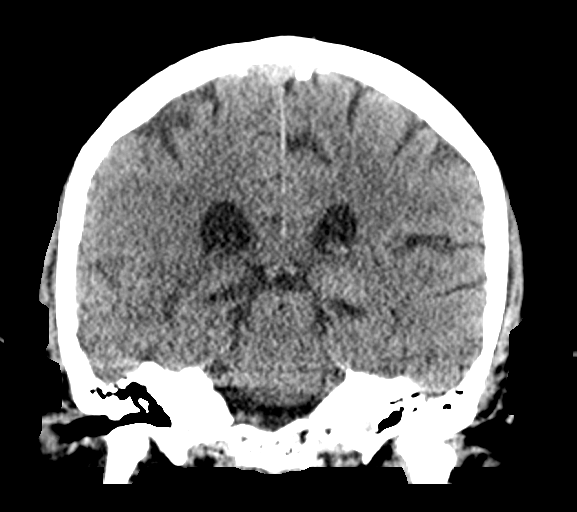
[im 39/71  brain]
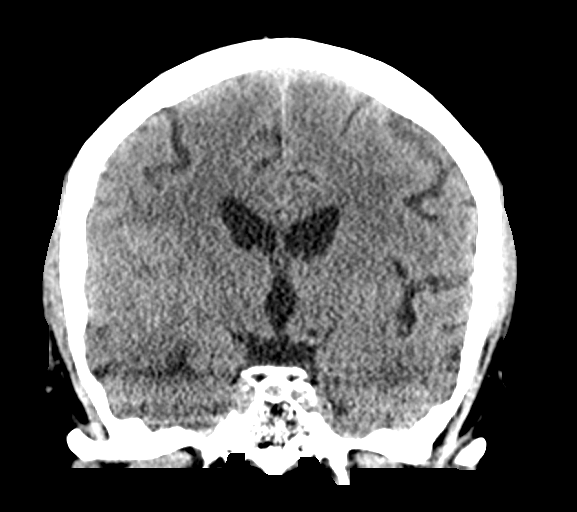

[Series 5: sagittal soft · sagittal · 0.34mm/px · 3 of 60 slices shown]
[im 20/60  brain]
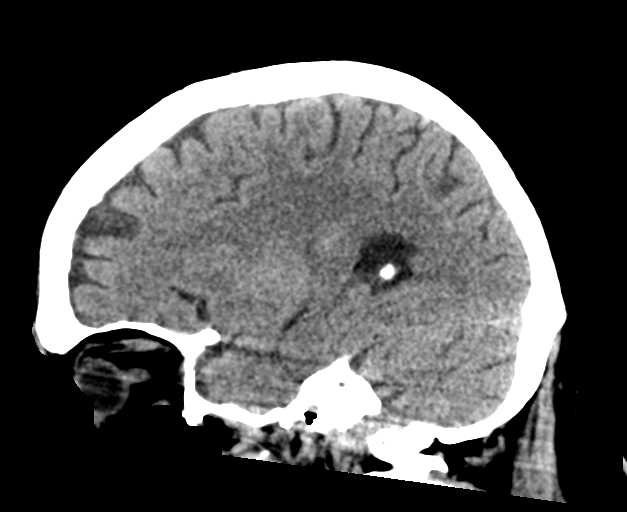
[im 30/60  brain]
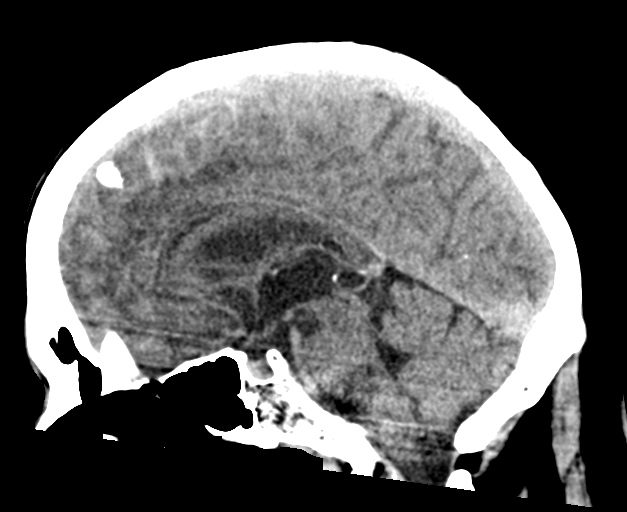
[im 40/60  brain]
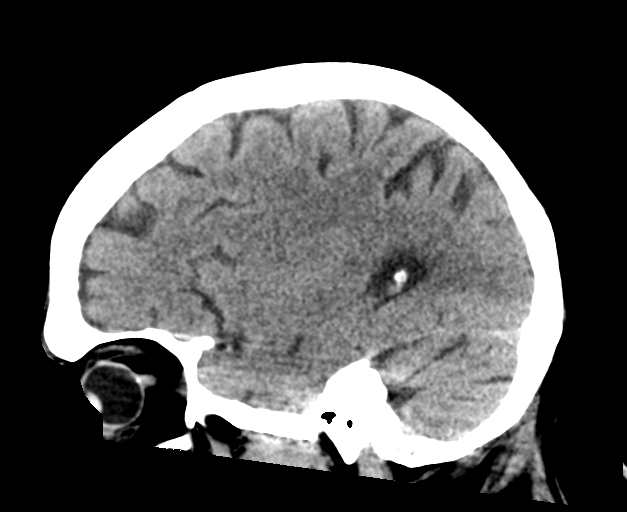

[14 of 47 positions shown; findings below may reference images not displayed]

Report from catheter based
angiography and intervention [DATE]. MRI brain and MRA head
[DATE].
FINDINGS: Brain:

Cerebral volume appears normal for age.

Persistent gyriform and curvilinear hyperdensity along the right
frontal, parietal and temporal lobes as well as right insula, along
the right sylvian fissure. This likely reflects a combination of
persistent small-volume subacute subarachnoid hemorrhage and
contrast staining/petechial hemorrhage at site of subacute infarcts.
The subarachnoid hemorrhage appears decreased in volume as compared
to the brain MRI of [DATE].

Additional known subacute infarcts within the right cerebral
hemisphere were better appreciated on the prior MRI.

Background mild patchy and ill-defined hypoattenuation within the
white matter, nonspecific but compatible with chronic small vessel
ischemic disease.

No evidence of an intracranial mass.

No midline shift or hydrocephalus.

Vascular: No hyperdense vessel.  Atherosclerotic calcifications.

Skull: Normal. Negative for fracture or focal lesion.

Sinuses/Orbits: Visualized orbits show no acute finding. No
significant paranasal sinus disease at the imaged levels.
IMPRESSION: Persistent gyriform and curvilinear hyperdensity along the right
frontal, parietal and temporal lobes as well as right insula, along
the right sylvian fissure. This likely reflects a combination of
persistent small-volume subacute subarachnoid hemorrhage and
contrast staining/petechial hemorrhage at site of subacute right MCA
territory infarcts. The subarachnoid hemorrhage appears decreased in
volume as compared to the brain MRI of [DATE].

Additional known subacute infarcts within the right cerebral
hemisphere were better appreciated on the prior MRI.

Please note if there is clinical concern for an interval acute
infarction, a brain MRI would have greater sensitivity for this
indication.

Background mild chronic small vessel ischemic changes within the
cerebral white matter.

## 2021-06-26 IMAGING — MR MR HEAD W/O CM
11 of 12 series · 42 of 48 positions shown · non-contrast
Comparison: Same-day CT head, MR head [DATE]

CLINICAL DATA: Neuro deficit, left sided tingling

EXAM:
MRI HEAD WITHOUT CONTRAST
TECHNIQUE: Multiplanar, multiecho pulse sequences of the brain and surrounding
structures were obtained without intravenous contrast.

[Series 5: DWI · axial · 4.0mm · 0.92mm/px · z∈[-111,+44]mm · 4 of 40 slices shown (1 of 4)]
[im 1/40]
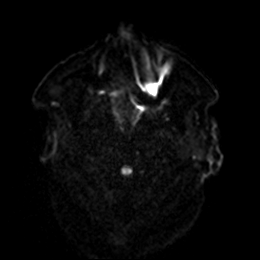
[im 14/40]
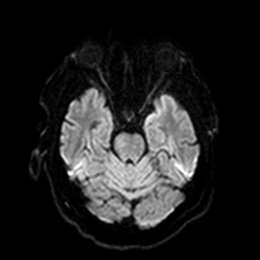
[im 27/40]
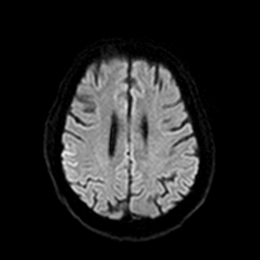
[im 40/40]
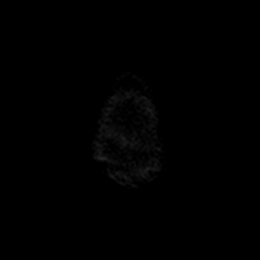

[Series 6: DWI · axial · 4.0mm · 0.92mm/px · z∈[-111,+44]mm · 4 of 40 slices shown (2 of 4)]
[im 1/40]
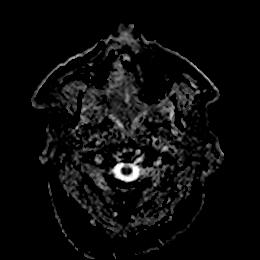
[im 14/40]
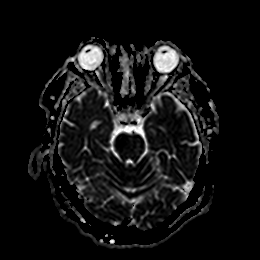
[im 27/40]
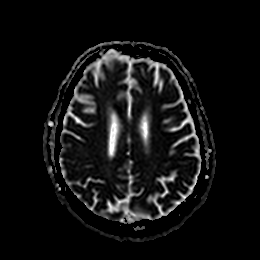
[im 40/40]
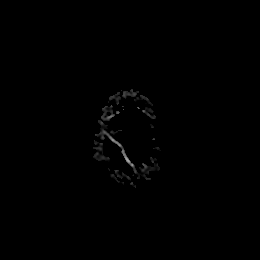

[Series 7: DWI · coronal · 5.0mm · 0.88mm/px · 3 of 32 slices shown (3 of 4)]
[im 1/32]
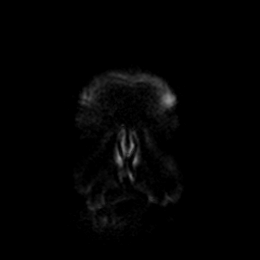
[im 16/32]
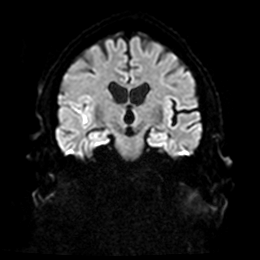
[im 32/32]
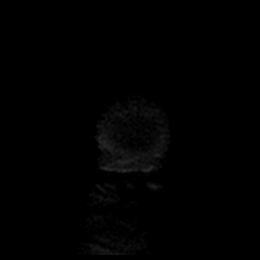

[Series 8: DWI · coronal · 5.0mm · 0.88mm/px · 3 of 32 slices shown (4 of 4)]
[im 1/32]
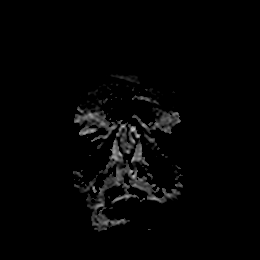
[im 16/32]
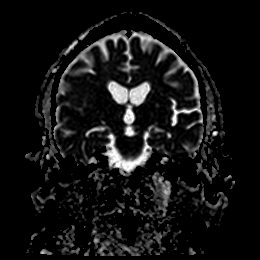
[im 32/32]
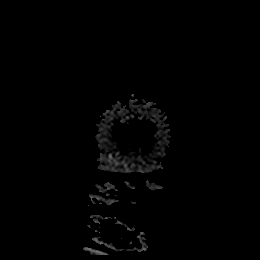

[Series 9: T1 · sagittal · 5.0mm · 0.75mm/px · 2 of 23 slices shown]
[im 1/23]
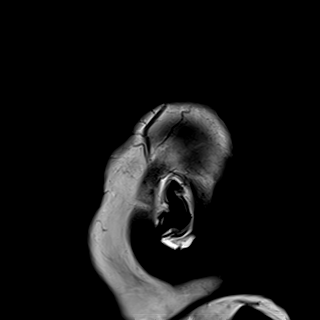
[im 23/23]
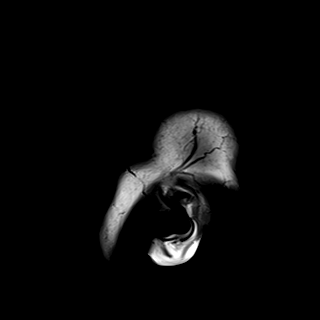

[Series 10: T2 · axial · 5.0mm · 0.72mm/px · z∈[-105,+41]mm · 2 of 22 slices shown (1 of 2)]
[im 1/22]
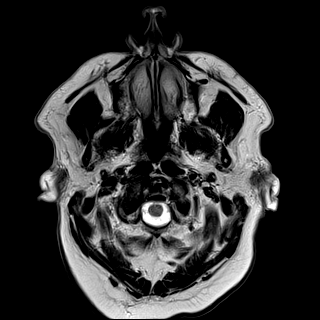
[im 22/22]
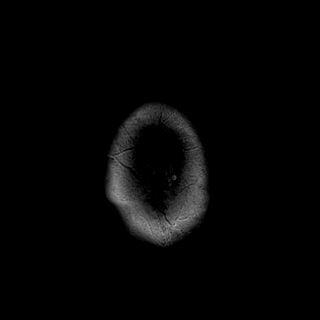

[Series 11: mag_images · axial · 3.0mm · 0.90mm/px · z∈[-112,+52]mm · 6 of 56 slices shown]
[im 1/56]
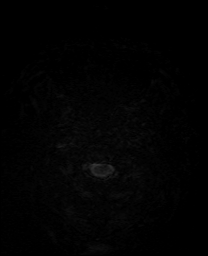
[im 12/56]
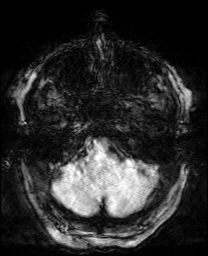
[im 23/56]
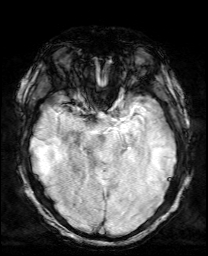
[im 34/56]
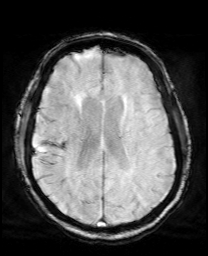
[im 45/56]
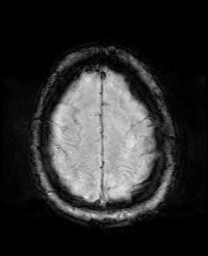
[im 56/56]
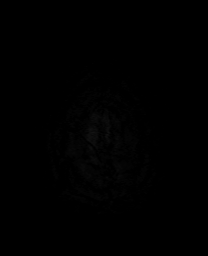

[Series 12: pha_images · axial · 3.0mm · 0.90mm/px · z∈[-109,+52]mm · 5 of 55 slices shown]
[im 1/55]
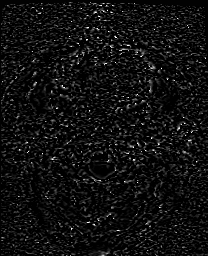
[im 14/55]
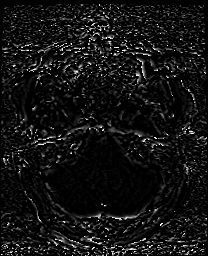
[im 28/55]
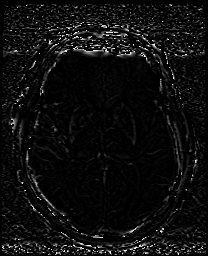
[im 41/55]
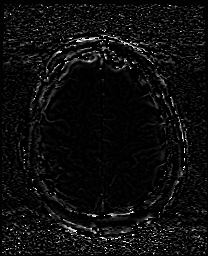
[im 55/55]
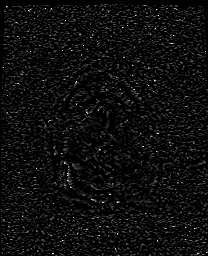

[Series 13: swi_images · axial · 3.0mm · 0.90mm/px · z∈[-112,+52]mm · 6 of 56 slices shown]
[im 1/56]
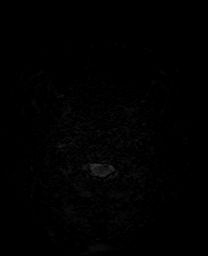
[im 12/56]
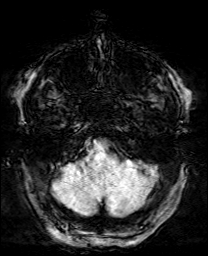
[im 23/56]
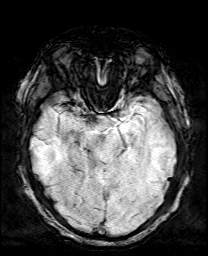
[im 34/56]
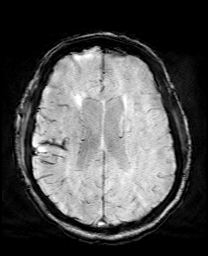
[im 45/56]
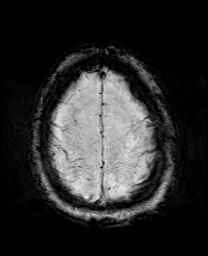
[im 56/56]
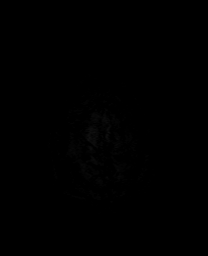

[Series 15: FLAIR · axial · 4.0mm · 0.45mm/px · z∈[-108,+47]mm · 4 of 40 slices shown]
[im 1/40]
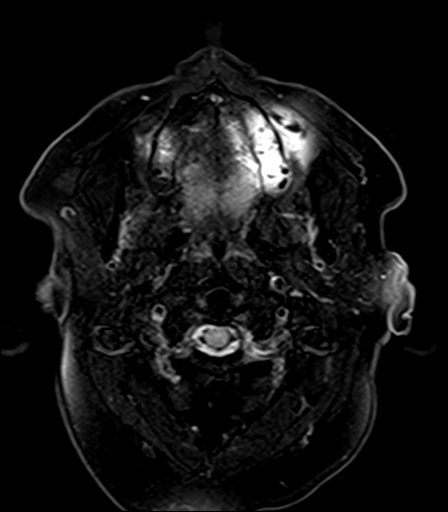
[im 14/40]
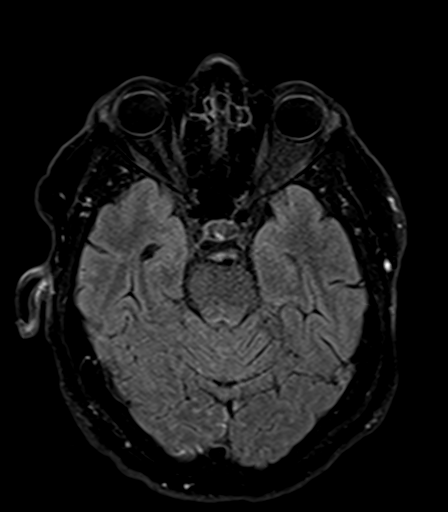
[im 27/40]
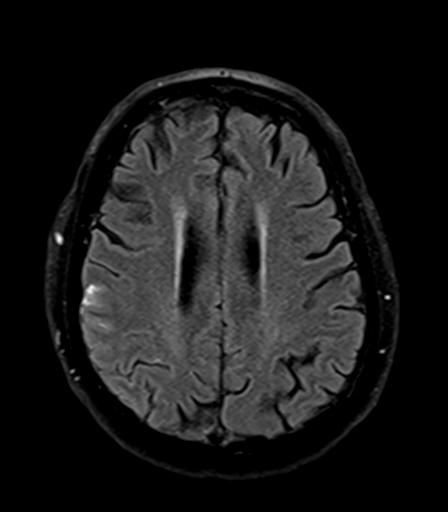
[im 40/40]
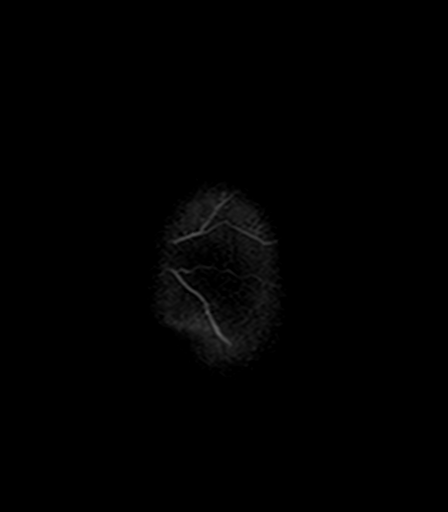

[Series 17: T2 · coronal · 5.0mm · 0.72mm/px · 3 of 30 slices shown (2 of 2)]
[im 1/30]
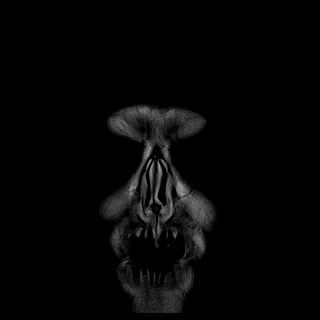
[im 15/30]
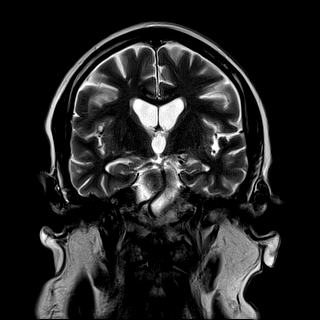
[im 30/30]
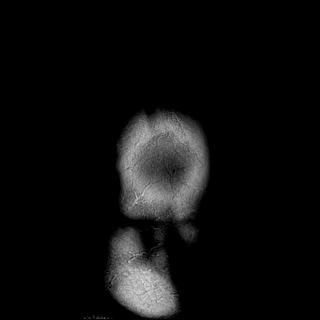

[42 of 48 positions shown; findings below may reference images not displayed]

FINDINGS: Brain: There is FLAIR signal abnormality with corresponding SWI
signal dropout and T1 hyperintensity in the right sylvian fissure
and right parietal lobe sulci corresponding to hyperdensity seen on
the same-day CT, likely reflecting residual subarachnoid blood. The
FLAIR signal abnormality is increased in conspicuity compared to the
MRI from [DATE]. Corresponding diffusion restriction in the
sylvian fissure and over the right parietal lobe is most likely due
to blood products.

Previously seen foci of diffusion restriction in the right caudate
body and scattered through the right cerebral hemisphere cortex have
decreased in conspicuity, consistent with expected evolution of
small infarcts. There is no new diffusion restriction to suggest
interval acute infarct.

The background of mild parenchymal volume loss and chronic white
matter microangiopathy is unchanged. There is no solid mass lesion.
There is no midline shift.

Vascular: The major flow voids are present. Specifically, the right
ICA and MCA flow voids remain present.

Skull and upper cervical spine: Normal marrow signal.

Sinuses/Orbits: The paranasal sinuses are clear. The globes and
orbits are unremarkable.

Other: None.
IMPRESSION: 1. Increased conspicuity of FLAIR signal abnormality with associated
SWI signal dropout and T1 hyperintensity in the right sylvian
fissure and right parietal sulci compared to the MRI of [DATE].
Findings suspicious for increased subarachnoid hemorrhage since that
MRI.
2. Expected evolution of punctate infarcts in the right cerebral
hemisphere seen on the prior MRI. No evidence of new acute infarct.

## 2021-06-26 MED ORDER — LORAZEPAM 2 MG/ML IJ SOLN
0.5000 mg | Freq: Once | INTRAMUSCULAR | Status: DC | PRN
  Filled 2021-06-26: qty 1

## 2021-06-26 NOTE — Discharge Instructions (Addendum)
Call your primary care doctor or specialist as discussed in the next 2-3 days.   Return immediately back to the ER if:  Your symptoms worsen within the next 12-24 hours. You develop new symptoms such as new fevers, persistent vomiting, new pain, shortness of breath, or new weakness or numbness, or if you have any other concerns.  

## 2021-06-26 NOTE — ED Provider Notes (Signed)
[facility] EMERGENCY DEPARTMENT Provider Note   CSN: [id number] Arrival date & time: 06/26/21  [id number]     History Chief Complaint  Patient presents with   Tingling    Stephen Hull is a 75 y.o. male.  Patient presents to ER chief complaint of recurrent tingling and numbness sensation in the left face left arm and left leg.  Patient states symptoms started this morning when he woke up.  Last known well was last night before going to bed around 9:51 PM.  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  Discharge from the hospital just 2 to 3 days ago.  Denies any other new trauma or fall.  Denies any fevers or cough or vomiting or diarrhea.  Denies any new weakness other than the tingling sensation in his left side.  He is continuing to take his Plavix and aspirin as written he states.      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, right ([place]) 06/17/2021   Acute CVA (cerebrovascular accident) ([place]) 06/13/2021   ICAO (internal carotid artery occlusion), right with Rt Eye Vision Loss 06/13/2021   Carotid stenosis, left--- 50 % 06/13/2021   Obesity (BMI 30-39.9) 06/13/2021   Hypertension 07/23/2016   PSVT (paroxysmal supraventricular tachycardia) ([place]) 07/23/2016   Bradycardia 07/23/2016    Past Surgical History:  Procedure Laterality Date   COLONOSCOPY     COLONOSCOPY N/A 11/14/2016   Procedure: COLONOSCOPY;  Surgeon: Daneil Dolin, MD;  Location: [facility];  Service: Endoscopy;  Laterality: N/A;  730    IR ANGIO INTRA EXTRACRAN SEL COM CAROTID INNOMINATE BILAT MOD SED  06/14/2021   IR ANGIO INTRA EXTRACRAN SEL INTERNAL CAROTID UNI R MOD SED  06/17/2021   IR ANGIO  VERTEBRAL SEL VERTEBRAL UNI R MOD SED  06/14/2021   IR CT HEAD LTD  06/17/2021   IR INTRAVSC STENT CERV CAROTID W/EMB-PROT MOD SED INCL ANGIO  06/17/2021   IR PERCUTANEOUS ART THROMBECTOMY/INFUSION INTRACRANIAL INC DIAG ANGIO  06/17/2021   IR US GUIDE VASC ACCESS RIGHT  06/14/2021   RADIOLOGY WITH ANESTHESIA N/A 06/17/2021   Procedure: IR WITH ANESTHESIA;  Surgeon: Luanne Bras, MD;  Location: [place];  Service: Radiology;  Laterality: N/A;   TONSILLECTOMY     as a kid       Family History  Problem Relation Age of Onset   Cancer Mother        Stomach   Cancer - Prostate Father    Heart attack Father    Colon cancer Neg Hx     Social History   Tobacco Use   Smoking status: Former    Types: Cigarettes    Quit date: 07/21/1989    Years since quitting: 31.9   Smokeless tobacco: Former    Quit date: 07/21/1989  Vaping Use   Vaping Use: Never used  Substance Use Topics   Alcohol use: No   Drug use: No    Home Medications Prior to Admission medications   Medication Sig Start Date End Date Taking? Authorizing Provider  acebutolol (SECTRAL) 200 MG capsule Take 1 capsule by mouth 2 (two) times daily.  06/18/16  Yes [provider]  acetaminophen (TYLENOL) 325 MG tablet Take 650 mg by mouth every 6 (six) hours as needed for headache.   Yes [provider]  aspirin 81 MG chewable tablet Chew 1 tablet (81 mg total) by mouth daily. 06/19/21  Yes Leatha Gilding, MD  atorvastatin (LIPITOR) 80 MG tablet Take 1 tablet (80 mg total) by mouth daily. 06/19/21  Yes Leatha Gilding, MD  Cholecalciferol (VITAMIN D3) 2000 units TABS Take 2,000 Units by mouth daily.   Yes [provider]  dextromethorphan-guaiFENesin (MUCINEX DM) 30-600 MG 12hr tablet Take 1 tablet by mouth daily as needed for cough.   Yes [provider]  losartan (COZAAR) 50 MG tablet Take 1 tablet by mouth daily. 06/18/16  Yes [provider]  omeprazole (PRILOSEC) 40 MG  capsule Take 1 capsule by mouth daily. 06/18/16  Yes [provider]  tamsulosin (FLOMAX) 0.4 MG CAPS capsule Take 0.4 mg by mouth daily. 06/12/21  Yes [provider]  ticagrelor (BRILINTA) 90 MG TABS tablet Take 1 tablet (90 mg total) by mouth 2 (two) times daily. 06/19/21  Yes Gherghe, Daylene Katayama, MD  triamcinolone ointment (KENALOG) 0.5 % Apply 1 application topically 2 (two) times daily as needed (Irritation). 01/27/20  Yes [provider]    Allergies    Cashew nut (anacardium occidentale) skin test  Review of Systems   Review of Systems  Constitutional:  Negative for fever.  HENT:  Negative for ear pain and sore throat.   Eyes:  Negative for pain.  Respiratory:  Negative for cough.   Cardiovascular:  Negative for chest pain.  Gastrointestinal:  Negative for abdominal pain.  Genitourinary:  Negative for flank pain.  Musculoskeletal:  Negative for back pain.  Skin:  Negative for color change and rash.  Neurological:  Negative for syncope.  All other systems reviewed and are negative.  Physical Exam Updated Vital Signs BP 132/86   Pulse 68   Temp 98.6 F (37 C)   Resp 15   Ht 5\' 11"  (1.803 m)   Wt 115 kg   SpO2 98%   BMI 35.36 kg/m   Physical Exam Constitutional:      Appearance: He is well-developed.  HENT:     Head: Normocephalic.     Nose: Nose normal.  Eyes:     Extraocular Movements: Extraocular movements intact.  Cardiovascular:     Rate and Rhythm: Normal rate.  Pulmonary:     Effort: Pulmonary effort is normal.  Skin:    Coloration: Skin is not jaundiced.  Neurological:     Mental Status: He is alert.     Comments: 5/5 strength bilateral upper and lower extremities.  Gait is normal without assistance.  Cranial nerves II through XII intact.  Subjective sense and chin patient reports sensation change to the left side of face left hand and left lower extremity without motor deficit.    ED Results / Procedures / Treatments    Labs (all labs ordered are listed, but only abnormal results are displayed) Labs Reviewed  CBC WITH DIFFERENTIAL/PLATELET - Abnormal; Notable for the following components:      Result Value   WBC 12.0 (*)    Neutro Abs 9.0 (*)    Abs Immature Granulocytes 0.11 (*)    All other components within normal limits  BASIC METABOLIC PANEL - Abnormal; Notable for the following components:   Glucose, Bld 105 (*)    Creatinine, Ser 1.31 (*)  GFR, Estimated 57 (*)    All other components within normal limits    EKG None  Radiology CT Head Wo Contrast  Result Date: 06/26/2021 CLINICAL DATA:  Neuro deficit, acute, stroke suspected. Additional history provided: Numbness/tingling left side of body, beginning this morning, recent stroke. EXAM: CT HEAD WITHOUT CONTRAST TECHNIQUE: Contiguous axial images were obtained from the base of the skull through the vertex without intravenous contrast. COMPARISON:  Brain MRI 06/19/2021. Report from catheter based angiography and intervention 06/17/2021. MRI brain and MRA head 06/13/2021. FINDINGS: Brain: Cerebral volume appears normal for age. Persistent gyriform and curvilinear hyperdensity along the right frontal, parietal and temporal lobes as well as right insula, along the right sylvian fissure. This likely reflects a combination of persistent small-volume subacute subarachnoid hemorrhage and contrast staining/petechial hemorrhage at site of subacute infarcts. The subarachnoid hemorrhage appears decreased in volume as compared to the brain MRI of 06/19/2021. Additional known subacute infarcts within the right cerebral hemisphere were better appreciated on the prior MRI. Background mild patchy and ill-defined hypoattenuation within the white matter, nonspecific but compatible with chronic small vessel ischemic disease. No evidence of an intracranial mass. No midline shift or hydrocephalus. Vascular: No hyperdense vessel.  Atherosclerotic calcifications. Skull:  Normal. Negative for fracture or focal lesion. Sinuses/Orbits: Visualized orbits show no acute finding. No significant paranasal sinus disease at the imaged levels. IMPRESSION: Persistent gyriform and curvilinear hyperdensity along the right frontal, parietal and temporal lobes as well as right insula, along the right sylvian fissure. This likely reflects a combination of persistent small-volume subacute subarachnoid hemorrhage and contrast staining/petechial hemorrhage at site of subacute right MCA territory infarcts. The subarachnoid hemorrhage appears decreased in volume as compared to the brain MRI of 06/19/2021. Additional known subacute infarcts within the right cerebral hemisphere were better appreciated on the prior MRI. Please note if there is clinical concern for an interval acute infarction, a brain MRI would have greater sensitivity for this indication. Background mild chronic small vessel ischemic changes within the cerebral white matter. Electronically Signed   By: Jackey LogeKyle  Golden D.O.   On: 06/26/2021 10:21   MR BRAIN WO CONTRAST  Result Date: 06/26/2021 CLINICAL DATA:  Neuro deficit, left sided tingling EXAM: MRI HEAD WITHOUT CONTRAST TECHNIQUE: Multiplanar, multiecho pulse sequences of the brain and surrounding structures were obtained without intravenous contrast. COMPARISON:  Same-day CT head, MR head 06/19/2021 FINDINGS: Brain: There is FLAIR signal abnormality with corresponding SWI signal dropout and T1 hyperintensity in the right sylvian fissure and right parietal lobe sulci corresponding to hyperdensity seen on the same-day CT, likely reflecting residual subarachnoid blood. The FLAIR signal abnormality is increased in conspicuity compared to the MRI from 06/19/2021. Corresponding diffusion restriction in the sylvian fissure and over the right parietal lobe is most likely due to blood products. Previously seen foci of diffusion restriction in the right caudate body and scattered through the  right cerebral hemisphere cortex have decreased in conspicuity, consistent with expected evolution of small infarcts. There is no new diffusion restriction to suggest interval acute infarct. The background of mild parenchymal volume loss and chronic white matter microangiopathy is unchanged. There is no solid mass lesion. There is no midline shift. Vascular: The major flow voids are present. Specifically, the right ICA and MCA flow voids remain present. Skull and upper cervical spine: Normal marrow signal. Sinuses/Orbits: The paranasal sinuses are clear. The globes and orbits are unremarkable. Other: None. IMPRESSION: 1. Increased conspicuity of FLAIR signal abnormality with associated SWI signal dropout and  T1 hyperintensity in the right sylvian fissure and right parietal sulci compared to the MRI of 06/19/2021. Findings suspicious for increased subarachnoid hemorrhage since that MRI. 2. Expected evolution of punctate infarcts in the right cerebral hemisphere seen on the prior MRI. No evidence of new acute infarct. Electronically Signed   By: Valetta Mole M.D.   On: 06/26/2021 11:36    Procedures Procedures   Medications Ordered in ED Medications  LORazepam (ATIVAN) injection 0.5 mg (has no administration in time range)    ED Course  I have reviewed the triage vital signs and the nursing notes.  Pertinent labs & imaging results that were available during my care of the patient were reviewed by me and considered in my medical decision making (see chart for details).    MDM Rules/Calculators/A&P                           Patient states that he had some left-sided sensation deficits off and on in the past around Thanksgiving as well as his last prior admission for stroke.  The symptoms had resolved until they recurred again this morning.  Repeat CT head unremarkable.  MRI pursued with findings suggestive of some increase in subarachnoid hemorrhage size.  Images and case discussed with  neurology, recommending continued dual antiplatelet therapy on outpatient basis.  Recommending outpatient follow-up with his neurologist within the week.  Patient informed of plan.  Advised immediate return for worsening or new symptoms or any additional concerns to return immediately to the ER otherwise follow-up with his neurology team within the week.  Final Clinical Impression(s) / ED Diagnoses Final diagnoses:  Cerebrovascular accident (CVA), unspecified mechanism [facility])    Rx / [place] Orders ED Discharge Orders     None        Luna Fuse, MD 06/26/21 1234

## 2021-06-26 NOTE — ED Triage Notes (Signed)
Pt woke up this am around 6am this morning with tingling to L side of body from head to feet.  Reports recently d/c from [facility] for stroke on Friday.  Pt is able to move all extremities.  Neg stroke screen by ems.

## 2021-06-28 ENCOUNTER — Encounter (HOSPITAL_COMMUNITY): Payer: Medicare HMO

## 2021-07-03 ENCOUNTER — Inpatient Hospital Stay: Payer: Self-pay | Admitting: Adult Health

## 2021-07-04 NOTE — Patient Outreach (Signed)
[name] had a Red Flag on the [facility] program report.  I have sent a referral to Marval Regal, RN for follow up.

## 2021-07-05 ENCOUNTER — Telehealth (HOSPITAL_COMMUNITY): Payer: Self-pay

## 2021-07-05 ENCOUNTER — Encounter: Payer: Self-pay | Admitting: *Deleted

## 2021-07-05 ENCOUNTER — Other Ambulatory Visit (HOSPITAL_COMMUNITY): Payer: Self-pay

## 2021-07-05 ENCOUNTER — Other Ambulatory Visit: Payer: Self-pay | Admitting: *Deleted

## 2021-07-05 NOTE — Patient Outreach (Signed)
[facility]) Care Management  07/05/2021  PINCHOS TOPEL 1945/08/09 [id number]   [facility] outreach for EMMI- stroke  RED ON EMMI ALERT Day #       13    Date: 07/03/21 Wednesday 10 am  Red Alert Reason: went to follow up appointment? No   Insurance: Paramedic  [facility] admissions x  1 ED visits x 1 in the last 6 months    Outreach attempt # 1 Patient is able to verify HIPAA identifiers [facility] Care Management RN reviewed and addressed red alert with patient  Consent: [facility] RN CM reviewed [facility] services with patient. Patient gave verbal consent for services [facility] telephonic RN CM.   Advised patient that there will be further automated EMMI- post discharge calls to assess how the patient is doing following the recent hospitalization Advised the patient that another call may be received from a nurse if any of their responses were abnormal. Patient voiced understanding and was appreciative of f/u call.    EMMI:  Appointments: Mr Glazier reports he now has all his needed follow up appointments scheduled but at the time of the outreach he was pending an ophthalmology appointment and his neurology appointment had been rescheduled by the office for an emergency  He reports he is awaiting a return call from neurology office RN CM shared with him that the neurology office generally places patients on a call waiting list during office rescheduled emergency and he may receive a call if another patient cancels an appointment. 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, right (HCC) 06/17/2021   Acute CVA (cerebrovascular accident) (HCC) 06/13/2021   ICAO (internal carotid artery occlusion), right with Rt Eye Vision Loss 06/13/2021   Carotid stenosis, left--- 50 % 06/13/2021   Obesity (BMI 30-39.9) 06/13/2021   Sciatica, right side 01/25/2018   Hypertension 07/23/2016   PSVT (paroxysmal supraventricular tachycardia) (HCC) 07/23/2016   Bradycardia 07/23/2016    Plan: Patient with no follow up at this time Not eligible for further services (only EMMI CVA) and no further identified needs reported by pt [facility] RN CM will closed [facility] case  Robbie Nangle L. Noelle Penner, RN, BSN, CCM [facility] Telephonic Care Management Care Coordinator Office number [phone] Mobile number [phone]  Main [facility] number [phone] Fax number [phone]

## 2021-07-05 NOTE — Telephone Encounter (Signed)
Pharmacy Transitions of Care Follow-up Telephone Call  Date of discharge: 06/19/21  Discharge Diagnosis: CVA  How have you been since you were released from the hospital? He had a second CVA early Dec but is now feeling better. He has not regained his sight in the affected eye though.    Medication changes made at discharge:  - START: Brilinta, ASA  - STOPPED:   - CHANGED: Atorvastatin  Medication changes verified by the patient? Yes   Medication Accessibility:  Home Pharmacy : He has obtained a 3 month supply of his Brilinta from his pharmacy  Was the patient provided with refills on discharged medications? no   Have all prescriptions been transferred from [facility] to home pharmacy? na   Is the patient able to afford medications? yes Notable copays:  Eligible patient assistance:     Medication Review: ( TICAGRELOR (BRILINTA) Ticagrelor 90 mg BID initiated on 06/19/21.  - Educated patient on expected duration of therapy of aspirin 81mg  with ticagrelor. Advised patient that dose of ticagrelor will be reduced after . Aspirin will be continued indefinitely/discontinued. - Discussed importance of taking medication around the same time every day, - Reviewed potential DDIs with patient - Advised patient of medications to avoid (NSAIDs, aspirin maintenance doses>100 mg daily) - Educated that Tylenol (acetaminophen) will be the preferred analgesic to prevent risk of bleeding  - Emphasized importance of monitoring for signs and symptoms of bleeding (abnormal bruising, prolonged bleeding, nose bleeds, bleeding from gums, discolored urine, black tarry stools)  - Educated patient to notify doctor if shortness of breath or abnormal heartbeat occur - Advised patient to alert all providers of antiplatelet therapy prior to starting a new medication or having a procedure   Follow-up Appointments:  PCP Hospital f/u appt confirmed? yes   Specialist Hospital f/u appt confirmed? yes  Neurology  follow up in Feb and Radiology follow up in a few days.   If their condition worsens, is the pt aware to call PCP or go to the Emergency Dept.? yes  Final Patient Assessment:Mr. Dillenbeck feels much better. 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 He is aware of his other follow up appts and plans to attend. He is taking his medication every day and has already obtained a 3 month supply on his Brilinta. He states he has no trouble affording his medications. He has some anxiety about having another stroke since he had 2 within a week of each other. I advised him that taking his Brilinta and Aspirin every day and not missing doses would lower his chances of having another episode. I counseled him on the blood thinners and advised him to get CoQ10 if he has increased muscle pain with the Lipitor 80mg .

## 2021-07-11 ENCOUNTER — Ambulatory Visit (HOSPITAL_COMMUNITY)
Admission: RE | Admit: 2021-07-11 | Discharge: 2021-07-11 | Disposition: A | Discharge status: Home / Self Care | Payer: Medicare HMO | Source: Ambulatory Visit | Attending: Physician Assistant | Admitting: Physician Assistant

## 2021-07-11 ENCOUNTER — Other Ambulatory Visit: Payer: Self-pay

## 2021-07-11 DIAGNOSIS — I6521 Occlusion and stenosis of right carotid artery: Secondary | ICD-10-CM

## 2021-07-13 HISTORY — PX: IR RADIOLOGIST EVAL & MGMT: IMG5224

## 2021-07-24 ENCOUNTER — Inpatient Hospital Stay: Payer: Self-pay | Admitting: Adult Health

## 2021-08-06 ENCOUNTER — Other Ambulatory Visit: Payer: Self-pay

## 2021-08-06 ENCOUNTER — Emergency Department (HOSPITAL_COMMUNITY)
Admission: EM | Admit: 2021-08-06 | Discharge: 2021-08-06 | Disposition: A | Discharge status: Home / Self Care | Payer: Medicare HMO | Attending: Emergency Medicine | Admitting: Emergency Medicine

## 2021-08-06 DIAGNOSIS — R531 Weakness: Secondary | ICD-10-CM | POA: Diagnosis not present

## 2021-08-06 DIAGNOSIS — Z7982 Long term (current) use of aspirin: Secondary | ICD-10-CM | POA: Diagnosis not present

## 2021-08-06 DIAGNOSIS — R200 Anesthesia of skin: Secondary | ICD-10-CM | POA: Diagnosis present

## 2021-08-06 NOTE — ED Provider Notes (Signed)
[facility] EMERGENCY DEPARTMENT Provider Note   CSN: [id number] Arrival date & time: 08/06/21  [id number]     History  Chief Complaint  Patient presents with   Tingling    Stephen Hull is a 76 y.o. male.  HPI He presents for evaluation of transient numbness of the left side of his face and arm that lasted about 1 hour today.  He thinks it might be because he was lying on his left side with his arm tucked under his cheek.  Symptoms present on awaking, and resolved spontaneously.  He has had recent CVA, both brain and right central artery occlusion.  He has followed up with ophthalmology and his PCP since that event.  He is also in the ED and had repeat MRI which showed mild progression of SAH.  He has been otherwise asymptomatic    Home Medications Prior to Admission medications   Medication Sig Start Date End Date Taking? Authorizing Provider  acebutolol (SECTRAL) 200 MG capsule Take 1 capsule by mouth 2 (two) times daily. 06/18/16  Yes [provider]  acetaminophen (TYLENOL) 325 MG tablet Take 650 mg by mouth every 6 (six) hours as needed for headache.   Yes [provider]  aspirin 81 MG chewable tablet Chew 1 tablet (81 mg total) by mouth daily. 06/19/21  Yes Caren Griffins, MD  atorvastatin (LIPITOR) 80 MG tablet Take 1 tablet (80 mg total) by mouth daily. 06/19/21  Yes Caren Griffins, MD  Cholecalciferol (VITAMIN D3) 2000 units TABS Take 2,000 Units by mouth daily.   Yes [provider]  dextromethorphan-guaiFENesin (MUCINEX DM) 30-600 MG 12hr tablet Take 1 tablet by mouth daily as needed for cough.   Yes [provider]  losartan (COZAAR) 50 MG tablet Take 1 tablet by mouth daily. 06/18/16  Yes [provider]  omeprazole (PRILOSEC) 40 MG capsule Take 1 capsule by mouth daily. 06/18/16  Yes [provider]  tamsulosin (FLOMAX) 0.4 MG CAPS capsule Take 0.4 mg by mouth daily. 06/12/21  Yes [provider]   ticagrelor (BRILINTA) 90 MG TABS tablet Take 1 tablet (90 mg total) by mouth 2 (two) times daily. 06/19/21  Yes Caren Griffins, MD  traZODone (DESYREL) 50 MG tablet Take 25-50 mg by mouth at bedtime as needed for sleep. 06/27/21  Yes [provider]  triamcinolone ointment (KENALOG) 0.5 % Apply 1 application topically 2 (two) times daily as needed (Irritation). 01/27/20  Yes [provider]      Allergies    Cashew nut (anacardium occidentale) skin test    Review of Systems   Review of Systems  All other systems reviewed and are negative.  Physical Exam Updated Vital Signs BP (!) 131/95 (BP Location: Left Arm)    Pulse 71    Temp 97.6 F (36.4 C) (Oral)    Resp (!) 22    Ht 5\' 11"  (1.803 m)    Wt 107 kg    SpO2 96%    BMI 32.92 kg/m  Physical Exam Vitals and nursing note reviewed.  Constitutional:      General: He is not in acute distress.    Appearance: He is well-developed. He is not ill-appearing or diaphoretic.  HENT:     Head: Normocephalic and atraumatic.     Right Ear: External ear normal.     Left Ear: External ear normal.  Eyes:     Conjunctiva/sclera: Conjunctivae normal.     Pupils: Pupils are equal, round, and  reactive to light.  Neck:     Trachea: Phonation normal.  Cardiovascular:     Rate and Rhythm: Normal rate and regular rhythm.     Heart sounds: Normal heart sounds.  Pulmonary:     Effort: Pulmonary effort is normal.     Breath sounds: Normal breath sounds.  Abdominal:     Palpations: Abdomen is soft.     Tenderness: There is no abdominal tenderness.  Musculoskeletal:        General: Normal range of motion.     Cervical back: Normal range of motion and neck supple.  Skin:    General: Skin is warm and dry.  Neurological:     Mental Status: He is alert and oriented to person, place, and time.     Sensory: No sensory deficit.     Motor: No abnormal muscle tone.     Coordination: Coordination normal.     Comments: No dysarthria or  aphasia.  Right arm weak bilaterally.  Psychiatric:        Mood and Affect: Mood normal.        Behavior: Behavior normal.        Thought Content: Thought content normal.        Judgment: Judgment normal.    ED Results / Procedures / Treatments   Labs (all labs ordered are listed, but only abnormal results are displayed) Labs Reviewed - No data to display  EKG EKG Interpretation  Date/Time:  Tuesday August 06 2021 07:33:57 EST Ventricular Rate:  79 PR Interval:  201 QRS Duration: 94 QT Interval:  375 QTC Calculation: 430 R Axis:   27 Text Interpretation: Sinus rhythm Low voltage, precordial leads Since last tracing pvc resolved Otherwise no significant change Confirmed by Daleen Bo [phone]) on 08/06/2021 12:00:23 PM  Radiology No results found.  Procedures Procedures    Medications Ordered in ED Medications - No data to display  ED Course/ Medical Decision Making/ A&P                           Medical Decision Making Patient presenting for symptoms, which concerned him for acute stroke recurrence.  This is his second visit to the ED for similar concerns.  Prior visit, showed slightly progressive subarachnoid hemorrhage.  Symptoms today resolved spontaneously within 1 hour.  Problems Addressed: Numbness: acute illness or injury    Details: Symptoms concerning for stroke.  Amount and/or Complexity of Data Reviewed Independent Historian:     Details: Independent historian External Data Reviewed: notes.    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 Discussion of management or test interpretation with external provider(s): Patient remained symptom-free after observation for 3 hours.  Doubt recurrent CVA.  Stable for discharge.  No change in chronic treatment plan.           Final Clinical  Impression(s) / ED Diagnoses Final diagnoses:  Numbness    Rx / DC Orders ED Discharge Orders     None         Daleen Bo, MD 08/06/21 1858

## 2021-08-06 NOTE — Discharge Instructions (Signed)
At this time there is no sign of a stroke.  Continue your usual treatments, and follow-up with your doctor for further care as needed.

## 2021-08-06 NOTE — ED Triage Notes (Signed)
Pt BIB EMS from home c/o left facial tingling around 5:30AM thought he slept on it, gone now. Pt states "since I had the stroke in November, it makes me nervous every time something new is going on so I just want to make sure". H/O stroke in November, eye problem on Rt eye. Denies Sob, chest pain.
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
## 2021-08-28 ENCOUNTER — Encounter: Payer: Self-pay | Admitting: Adult Health

## 2021-08-28 ENCOUNTER — Ambulatory Visit: Payer: Medicare HMO | Admitting: Adult Health

## 2021-08-28 VITALS — BP 156/91 | HR 66 | Ht 71.0 in | Wt 241.0 lb

## 2021-08-28 DIAGNOSIS — Z09 Encounter for follow-up examination after completed treatment for conditions other than malignant neoplasm: Secondary | ICD-10-CM | POA: Diagnosis not present

## 2021-08-28 DIAGNOSIS — I63231 Cerebral infarction due to unspecified occlusion or stenosis of right carotid arteries: Secondary | ICD-10-CM | POA: Diagnosis not present

## 2021-08-28 DIAGNOSIS — R29818 Other symptoms and signs involving the nervous system: Secondary | ICD-10-CM

## 2021-08-28 DIAGNOSIS — H3411 Central retinal artery occlusion, right eye: Secondary | ICD-10-CM

## 2021-08-28 NOTE — Patient Instructions (Addendum)
Continue aspirin 81 mg daily  and atorvastatin for secondary stroke prevention  Concern of possible sleep apnea - you will be called to schedule initial evaluation with our sleep doctors   Continue routine follow-up with ophthalmology as scheduled  Continue routine follow-up with Dr. Corliss Skains for carotid artery monitoring and ongoing use of Brilinta - his office will continue to manage and advise duration   Continue to follow up with PCP regarding cholesterol and blood pressure management  Maintain strict control of hypertension with blood pressure goal below 130/90 and cholesterol with LDL cholesterol (bad cholesterol) goal below 70 mg/dL.   Signs of a Stroke? Follow the BEFAST method:  Balance Watch for a sudden loss of balance, trouble with coordination or vertigo Eyes Is there a sudden loss of vision in one or both eyes? Or double vision?  Face: Ask the person to smile. Does one side of the face droop or is it numb?  Arms: Ask the person to raise both arms. Does one arm drift downward? Is there weakness or numbness of a leg? Speech: Ask the person to repeat a simple phrase. Does the speech sound slurred/strange? Is the person confused ? Time: If you observe any of these signs, call 911.       Thank you for coming to see Stephen Hull at [facility]. I hope we have been able to provide you high quality care today.  You may receive a patient satisfaction survey over the next few weeks. We would appreciate your feedback and comments so that we may continue to improve ourselves and the health of our patients.     Stroke Prevention Some medical conditions and lifestyle choices can lead to a higher risk for a stroke. You can help to prevent a stroke by eating healthy foods and exercising. It also helps to not smoke and to manage any health problems you may have. How can this condition affect me? A stroke is an emergency. It should be treated right away. A stroke can lead to  brain damage or threaten your life. There is a better chance of surviving and getting better after a stroke if you get medical help right away. What can increase my risk? The following medical conditions may increase your risk of a stroke: Diseases of the heart and blood vessels (cardiovascular disease). High blood pressure (hypertension). Diabetes. High cholesterol. Sickle cell disease. Problems with blood clotting. Being very overweight. Sleeping problems (obstructivesleep apnea). Other risk factors include: Being older than age 64. A history of blood clots, stroke, or mini-stroke (TIA). Race, ethnic background, or a family history of stroke. Smoking or using tobacco products. Taking birth control pills, especially if you smoke. Heavy alcohol and drug use. Not being active. What actions can I take to prevent this? Manage your health conditions High cholesterol. Eat a healthy diet. If this is not enough to manage your cholesterol, you may need to take medicines. Take medicines as told by your doctor. High blood pressure. Try to keep your blood pressure below 130/80. If your blood pressure cannot be managed through a healthy diet and regular exercise, you may need to take medicines. Take medicines as told by your doctor. Ask your doctor if you should check your blood pressure at home. Have your blood pressure checked every year. Diabetes. Eat a healthy diet and get regular exercise. If your blood sugar (glucose) cannot be managed through diet and exercise, you may need to take medicines. Take medicines as told by your doctor. Talk  to your doctor about getting checked for sleeping problems. Signs of a problem can include: Snoring a lot. Feeling very tired. Make sure that you manage any other conditions you have. Nutrition  Follow instructions from your doctor about what to eat or drink. You may be told to: Eat and drink fewer calories each day. Limit how much salt (sodium) you  use to 1,500 milligrams (mg) each day. Use only healthy fats for cooking, such as olive oil, canola oil, and sunflower oil. Eat healthy foods. To do this: Choose foods that are high in fiber. These include whole grains, and fresh fruits and vegetables. Eat at least 5 servings of fruits and vegetables a day. Try to fill one-half of your plate with fruits and vegetables at each meal. Choose low-fat (lean) proteins. These include low-fat cuts of meat, chicken without skin, fish, tofu, beans, and nuts. Eat low-fat dairy products. Avoid foods that: Are high in salt. Have saturated fat. Have trans fat. Have cholesterol. Are processed or pre-made. Count how many carbohydrates you eat and drink each day. Lifestyle If you drink alcohol: Limit how much you have to: 0-1 drink a day for women who are not pregnant. 0-2 drinks a day for men. Know how much alcohol is in your drink. In the [place] one drink equals one 12 oz bottle of beer ( ), one 5 oz glass of wine ( ), or one 1 oz glass of hard liquor (6mL). Do not smoke or use any products that have nicotine or tobacco. If you need help quitting, ask your doctor. Avoid secondhand smoke. Do not use drugs. Activity  Try to stay at a healthy weight. Get at least 30 minutes of exercise on most days, such as: Fast walking. Biking. Swimming. Medicines Take over-the-counter and prescription medicines only as told by your doctor. Avoid taking birth control pills. Talk to your doctor about the risks of taking birth control pills if: You are over 23 years old. You smoke. You get very bad headaches. You have had a blood clot. Where to find more information American Stroke Association: www.strokeassociation.org Get help right away if: You or a loved one has any signs of a stroke. "BE FAST" is an easy way to remember the warning signs: B - Balance. Dizziness, sudden trouble walking, or loss of balance. E - Eyes. Trouble seeing or a change in  how you see. F - Face. Sudden weakness or loss of feeling of the face. The face or eyelid may droop on one side. A - Arms. Weakness or loss of feeling in an arm. This happens all of a sudden and most often on one side of the body. S - Speech. Sudden trouble speaking, slurred speech, or trouble understanding what people say. T - Time. Time to call emergency services. Write down what time symptoms started. You or a loved one has other signs of a stroke, such as: A sudden, very bad headache with no known cause. Feeling like you may vomit (nausea). Vomiting. A seizure. These symptoms may be an emergency. Get help right away. Call your local emergency services (911 in the [place] Do not wait to see if the symptoms will go away. Do not drive yourself to the hospital. Summary You can help to prevent a stroke by eating healthy, exercising, and not smoking. It also helps to manage any health problems you have. Do not smoke or use any products that contain nicotine or tobacco. Get help right away if you or a loved one has any  signs of a stroke. This information is not intended to replace advice given to you by your health care provider. Make sure you discuss any questions you have with your health care provider. Document Revised: 02/06/2020 Document Reviewed: 02/06/2020 Elsevier Patient Education  2022 [employer].

## 2021-09-02 ENCOUNTER — Observation Stay (HOSPITAL_COMMUNITY): Payer: Medicare HMO

## 2021-09-02 ENCOUNTER — Observation Stay (HOSPITAL_COMMUNITY)
Admission: EM | Admit: 2021-09-02 | Discharge: 2021-09-03 | Disposition: A | Discharge status: Home / Self Care | Payer: Medicare HMO | Attending: Internal Medicine | Admitting: Internal Medicine

## 2021-09-02 ENCOUNTER — Encounter (HOSPITAL_COMMUNITY): Payer: Self-pay

## 2021-09-02 ENCOUNTER — Emergency Department (HOSPITAL_COMMUNITY): Payer: Medicare HMO

## 2021-09-02 ENCOUNTER — Other Ambulatory Visit: Payer: Self-pay

## 2021-09-02 DIAGNOSIS — I10 Essential (primary) hypertension: Secondary | ICD-10-CM | POA: Insufficient documentation

## 2021-09-02 DIAGNOSIS — R531 Weakness: Secondary | ICD-10-CM | POA: Diagnosis present

## 2021-09-02 DIAGNOSIS — R2689 Other abnormalities of gait and mobility: Secondary | ICD-10-CM | POA: Diagnosis not present

## 2021-09-02 DIAGNOSIS — Z7982 Long term (current) use of aspirin: Secondary | ICD-10-CM | POA: Diagnosis not present

## 2021-09-02 DIAGNOSIS — Z20822 Contact with and (suspected) exposure to covid-19: Secondary | ICD-10-CM | POA: Diagnosis not present

## 2021-09-02 DIAGNOSIS — Z6834 Body mass index (BMI) 34.0-34.9, adult: Secondary | ICD-10-CM | POA: Diagnosis not present

## 2021-09-02 DIAGNOSIS — E669 Obesity, unspecified: Secondary | ICD-10-CM | POA: Diagnosis not present

## 2021-09-02 DIAGNOSIS — I6522 Occlusion and stenosis of left carotid artery: Secondary | ICD-10-CM | POA: Insufficient documentation

## 2021-09-02 DIAGNOSIS — H3410 Central retinal artery occlusion, unspecified eye: Secondary | ICD-10-CM

## 2021-09-02 DIAGNOSIS — R209 Unspecified disturbances of skin sensation: Secondary | ICD-10-CM

## 2021-09-02 DIAGNOSIS — Z79899 Other long term (current) drug therapy: Secondary | ICD-10-CM | POA: Diagnosis not present

## 2021-09-02 DIAGNOSIS — E782 Mixed hyperlipidemia: Secondary | ICD-10-CM | POA: Diagnosis not present

## 2021-09-02 LAB — COMPREHENSIVE METABOLIC PANEL
ALT: 33 U/L (ref 0–44)
AST: 22 U/L (ref 15–41)
Albumin: 3.7 g/dL (ref 3.5–5.0)
Alkaline Phosphatase: 69 U/L (ref 38–126)
Anion gap: 10 (ref 5–15)
BUN: 18 mg/dL (ref 8–23)
CO2: 25 mmol/L (ref 22–32)
Calcium: 8.9 mg/dL (ref 8.9–10.3)
Chloride: 101 mmol/L (ref 98–111)
Creatinine, Ser: 1.27 mg/dL — ABNORMAL HIGH (ref 0.61–1.24)
GFR, Estimated: 60 mL/min — ABNORMAL LOW (ref >60)
Glucose, Bld: 100 mg/dL — ABNORMAL HIGH (ref 70–99)
Potassium: 4.3 mmol/L (ref 3.5–5.1)
Sodium: 136 mmol/L (ref 135–145)
Total Bilirubin: 2 mg/dL — ABNORMAL HIGH (ref 0.3–1.2)
Total Protein: 6.9 g/dL (ref 6.5–8.1)

## 2021-09-02 LAB — CBC
HCT: 46.5 % (ref 39.0–52.0)
Hemoglobin: 14.7 g/dL (ref 13.0–17.0)
MCH: 29.3 pg (ref 26.0–34.0)
MCHC: 31.6 g/dL (ref 30.0–36.0)
MCV: 92.6 fL (ref 80.0–100.0)
Platelets: 234 10*3/uL (ref 150–400)
RBC: 5.02 MIL/uL (ref 4.22–5.81)
RDW: 14.3 % (ref 11.5–15.5)
WBC: 16.4 10*3/uL — ABNORMAL HIGH (ref 4.0–10.5)
nRBC: 0 % (ref 0.0–0.2)

## 2021-09-02 LAB — URINALYSIS, ROUTINE W REFLEX MICROSCOPIC
Bilirubin Urine: NEGATIVE
Glucose, UA: NEGATIVE mg/dL
Hgb urine dipstick: NEGATIVE
Ketones, ur: NEGATIVE mg/dL
Leukocytes,Ua: NEGATIVE
Nitrite: NEGATIVE
Protein, ur: NEGATIVE mg/dL
Specific Gravity, Urine: 1.015 (ref 1.005–1.030)
pH: 6 (ref 5.0–8.0)

## 2021-09-02 LAB — PROTIME-INR
INR: 1 (ref 0.8–1.2)
Prothrombin Time: 13.4 seconds (ref 11.4–15.2)

## 2021-09-02 LAB — RAPID URINE DRUG SCREEN, HOSP PERFORMED
Amphetamines: NOT DETECTED
Barbiturates: NOT DETECTED
Benzodiazepines: NOT DETECTED
Cocaine: NOT DETECTED
Opiates: NOT DETECTED
Tetrahydrocannabinol: POSITIVE — AB

## 2021-09-02 LAB — DIFFERENTIAL
Abs Immature Granulocytes: 0.1 10*3/uL — ABNORMAL HIGH (ref 0.00–0.07)
Basophils Absolute: 0.1 10*3/uL (ref 0.0–0.1)
Basophils Relative: 1 %
Eosinophils Absolute: 0.4 10*3/uL (ref 0.0–0.5)
Eosinophils Relative: 3 %
Immature Granulocytes: 1 %
Lymphocytes Relative: 11 %
Lymphs Abs: 1.9 10*3/uL (ref 0.7–4.0)
Monocytes Absolute: 1 10*3/uL (ref 0.1–1.0)
Monocytes Relative: 6 %
Neutro Abs: 12.9 10*3/uL — ABNORMAL HIGH (ref 1.7–7.7)
Neutrophils Relative %: 78 %

## 2021-09-02 LAB — APTT: aPTT: 31 seconds (ref 24–36)

## 2021-09-02 LAB — CBG MONITORING, ED: Glucose-Capillary: 98 mg/dL (ref 70–99)

## 2021-09-02 LAB — RESP PANEL BY RT-PCR (FLU A&B, COVID) ARPGX2
Influenza A by PCR: NEGATIVE
Influenza B by PCR: NEGATIVE
SARS Coronavirus 2 by RT PCR: NEGATIVE

## 2021-09-02 LAB — ETHANOL: Alcohol, Ethyl (B): <10 mg/dL (ref <10)

## 2021-09-02 IMAGING — CT CT ANGIO HEAD-NECK (W OR W/O PERF)
2 of 7 series · 8 of 33 positions shown · IV contrast (Omnipaque or Isovue)
Comparison: [DATE] CTA head neck, correlation is also made with
same day CT head.

CLINICAL DATA: Left-sided numbness, stroke suspected, history of
recent stroke

EXAM:
CT ANGIOGRAPHY HEAD AND NECK
TECHNIQUE: Multidetector CT imaging of the head and neck was performed using
the standard protocol during bolus administration of intravenous
contrast. Multiplanar CT image reconstructions and MIPs were
obtained to evaluate the vascular anatomy. Carotid stenosis
measurements (when applicable) are obtained utilizing NASCET
criteria, using the distal internal carotid diameter as the
denominator.

[Series 4: cta head & neck · axial · 0.54mm/px · z∈[+1213,+1327]mm · 2 of 171 slices shown]
[im 57/171  soft-tissue]
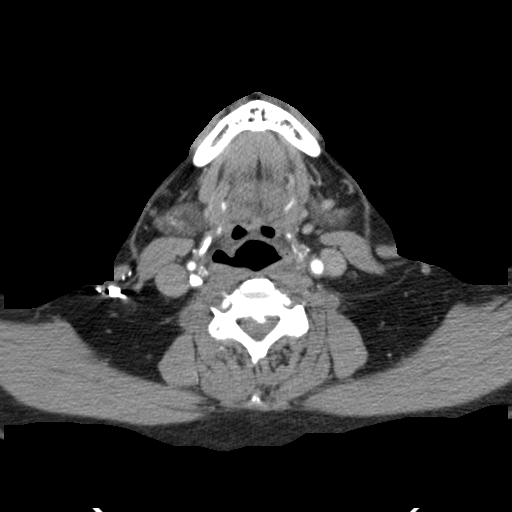
[im 114/171  soft-tissue]
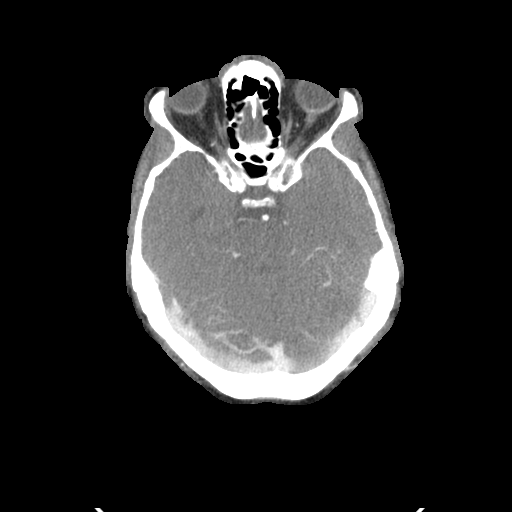

[Series 6: ax thins · axial · 0.39mm/px · z∈[+1149,+1394]mm · 6 of 343 slices shown]
[im 49/343  soft-tissue]
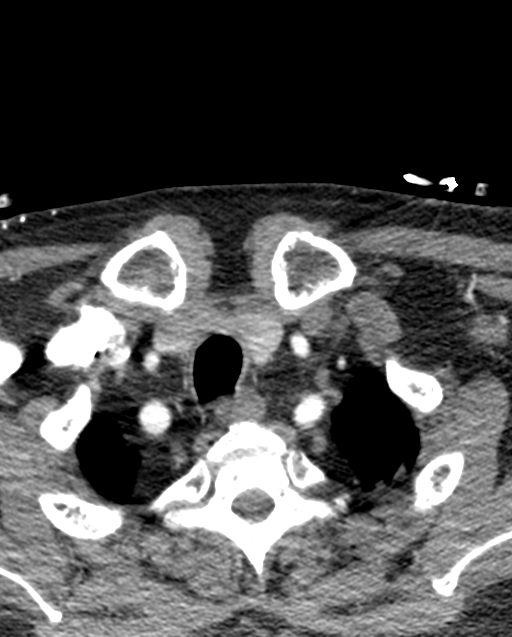
[im 98/343  bone]
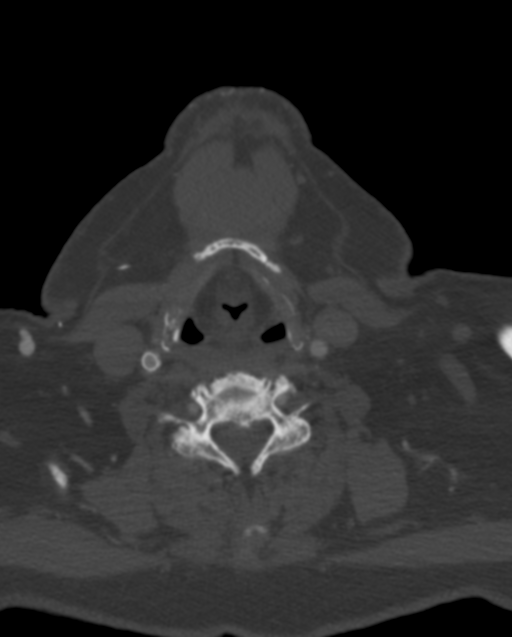
[im 147/343  soft-tissue]
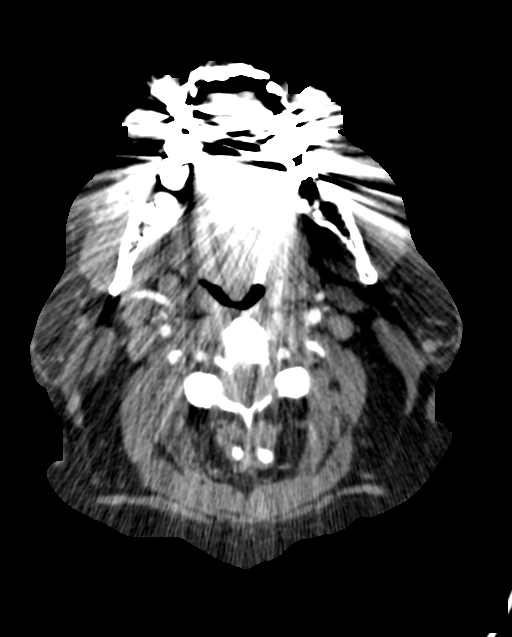
[im 196/343  bone]
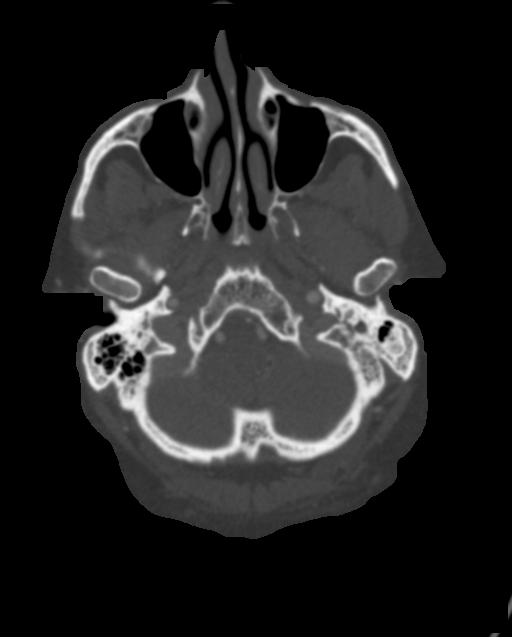
[im 245/343  soft-tissue]
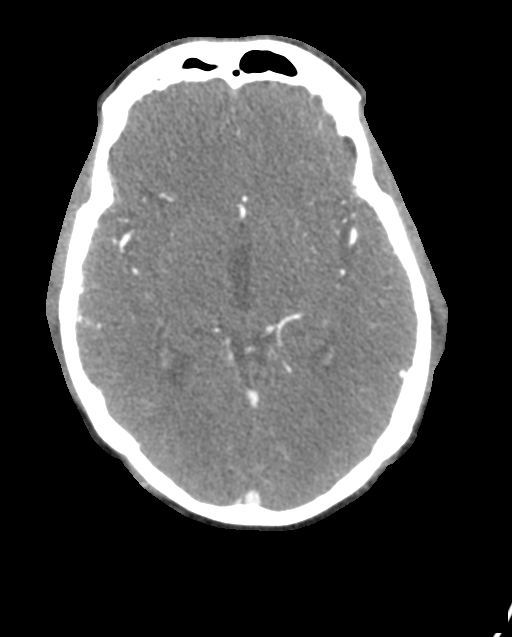
[im 294/343  bone]
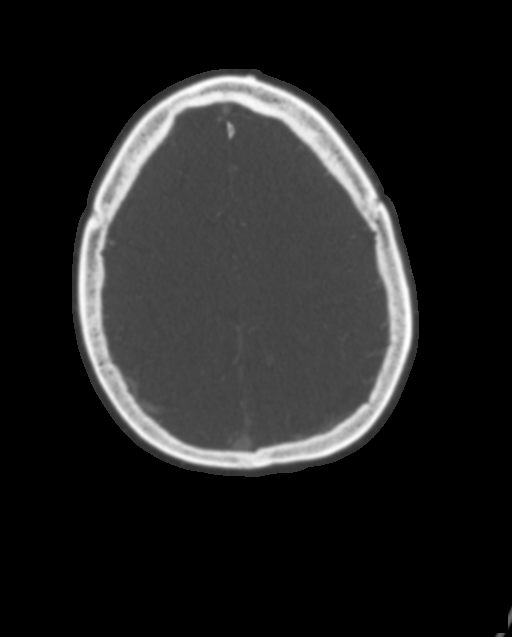

[8 of 33 positions shown; findings below may reference images not displayed]

RADIATION DOSE REDUCTION: This exam was performed according to the
departmental dose-optimization program which includes automated
exposure control, adjustment of the mA and/or kV according to
patient size and/or use of iterative reconstruction technique.

CONTRAST:  75mL OMNIPAQUE IOHEXOL 350 MG/ML SOLN
FINDINGS: CT HEAD FINDINGS

For noncontrast findings, please see same day CT head.

CTA NECK FINDINGS

Aortic arch: Two-vessel arch with a common origin of the
brachiocephalic and left common carotid arteries. Imaged portion
shows no evidence of aneurysm or dissection. No significant stenosis
of the major arch vessel origins. Aortic atherosclerosis.

Right carotid system: No evidence of dissection, stenosis (50% or
greater) or occlusion. Interval placement of a right carotid stent,
which appears patent. The right ICA distal to the stent also appears
patent. The right ECA is patent.

Left carotid system: No evidence of dissection or occlusion.
Redemonstrated plaque at the bifurcation causing approximately 50%
stenosis.

Vertebral arteries: Codominant. No evidence of dissection, stenosis
(50% or greater) or occlusion.

Skeleton: Osteopenia and degenerative changes in the cervical spine.
No acute osseous abnormality.

Other neck: Negative.

Upper chest: No focal pulmonary opacity or pleural effusion.

Review of the MIP images confirms the above findings

CTA HEAD FINDINGS

Anterior circulation: Both internal carotid arteries are patent to
the termini, without significant stenosis.

A1 segments patent, with a hypoplastic right A1. Normal anterior
communicating artery. Anterior cerebral arteries are patent to their
distal aspects.

No M1 stenosis or occlusion. Normal MCA bifurcations. Distal MCA
branches perfused and symmetric.

Posterior circulation:

Vertebral arteries patent to the vertebrobasilar junction without
stenosis. Posterior inferior cerebral arteries patent bilaterally.

Basilar patent to its distal aspect. Superior cerebellar arteries
patent bilaterally.

Patent P1 segments. PCAs perfused to their distal aspects without
stenosis. Decreased conspicuity of the previously noted right
posterior communicating artery. The left posterior communicating
artery is not visualized.

Venous sinuses: As permitted by contrast timing, patent.

Anatomic variants: None significant

Review of the MIP images confirms the above findings
IMPRESSION: 1. Status post interval placement of a right carotid stent, which
appears patent. The right ECA and ICA distal to the stent are also
patent.
2. Approximate 50% narrowing of the proximal left ICA. No other
hemodynamically significant stenosis in the neck.
3.  No intracranial large vessel occlusion or significant stenosis.

## 2021-09-02 IMAGING — MR MR HEAD W/O CM
11 of 12 series · 41 of 48 positions shown · non-contrast
Comparison: Prior head CT examinations [DATE] and earlier.
Brain MRI [DATE].

CLINICAL DATA: Provided history: Neuro deficit, acute, stroke
suspected.

EXAM:
MRI HEAD WITHOUT CONTRAST
TECHNIQUE: Multiplanar, multiecho pulse sequences of the brain and surrounding
structures were obtained without intravenous contrast.

[Series 5: DWI · axial · 4.0mm · 0.88mm/px · z∈[-83,+55]mm · 4 of 36 slices shown (1 of 6)]
[im 1/36]
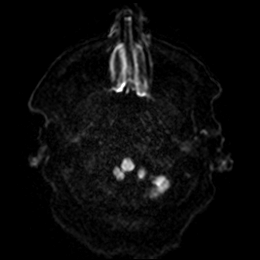
[im 12/36]
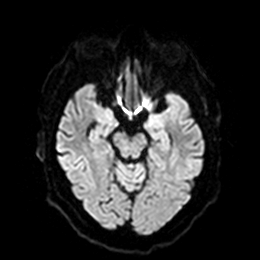
[im 24/36]
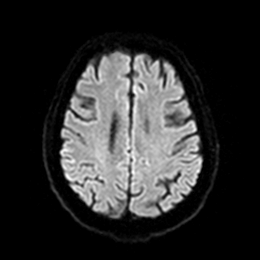
[im 36/36]
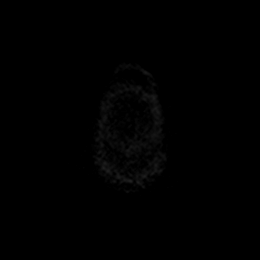

[Series 5: DWI · axial · 4.0mm · 0.88mm/px · z∈[-83,+55]mm · 4 of 36 slices shown (2 of 6)]
[im 1/36]
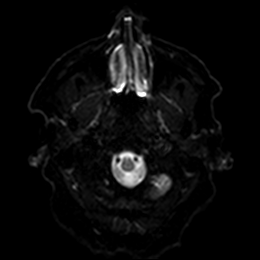
[im 12/36]
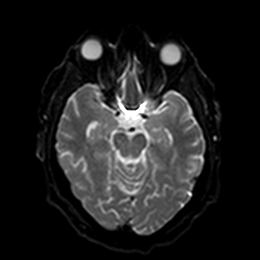
[im 24/36]
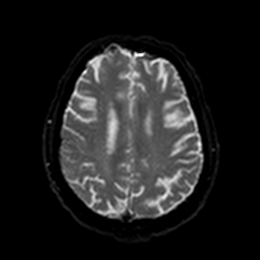
[im 36/36]
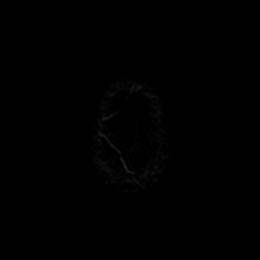

[Series 6: DWI · axial · 4.0mm · 0.88mm/px · z∈[-83,+55]mm · 4 of 36 slices shown (3 of 6)]
[im 1/36]
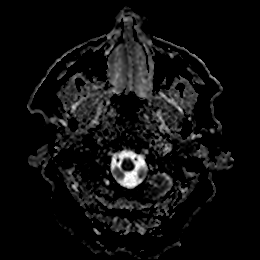
[im 12/36]
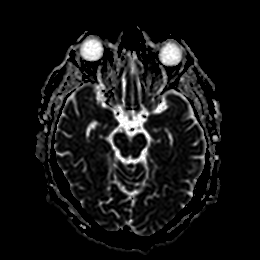
[im 24/36]
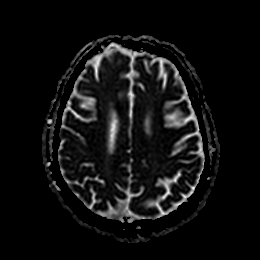
[im 36/36]
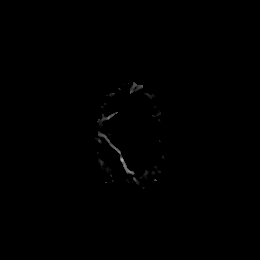

[Series 7: DWI · coronal · 5.0mm · 0.88mm/px · 3 of 29 slices shown (4 of 6)]
[im 1/29]
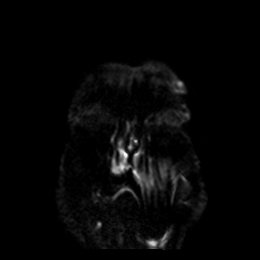
[im 15/29]
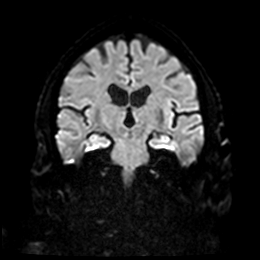
[im 29/29]
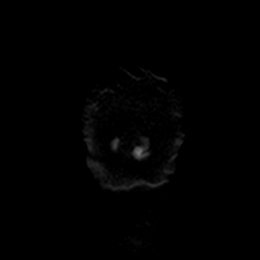

[Series 7: DWI · coronal · 5.0mm · 0.88mm/px · 4 of 29 slices shown (5 of 6)]
[im 1/29]
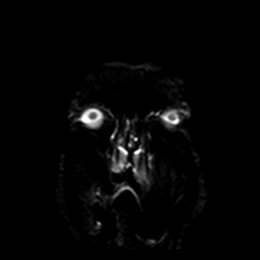
[im 10/29]
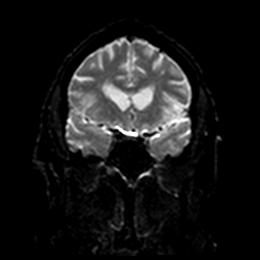
[im 19/29]
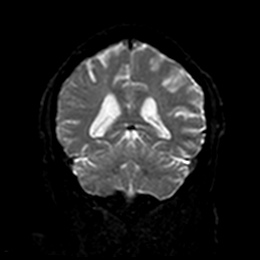
[im 29/29]
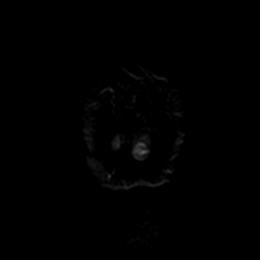

[Series 8: DWI · coronal · 5.0mm · 0.88mm/px · 4 of 29 slices shown (6 of 6)]
[im 1/29]
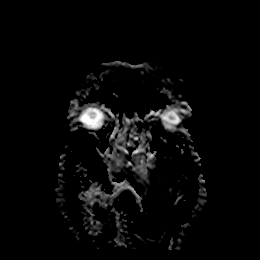
[im 10/29]
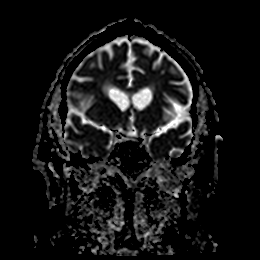
[im 19/29]
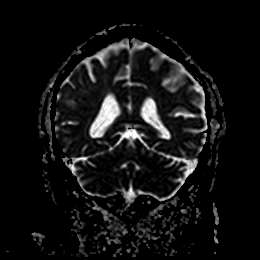
[im 29/29]
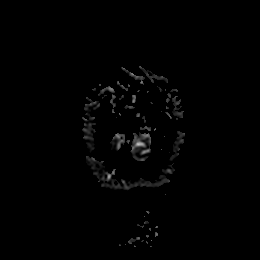

[Series 9: T1 · sagittal · 5.0mm · 0.94mm/px · 3 of 23 slices shown]
[im 1/23]
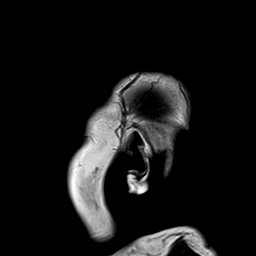
[im 12/23]
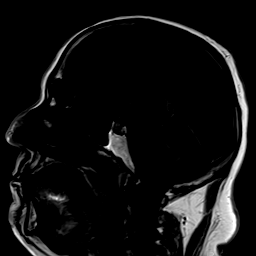
[im 23/23]
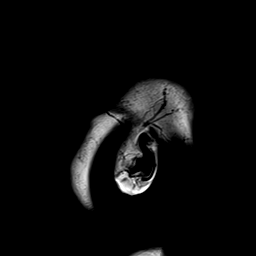

[Series 10: T2 · axial · 5.0mm · 0.72mm/px · z∈[-86,+59]mm · 3 of 22 slices shown (1 of 2)]
[im 1/22]
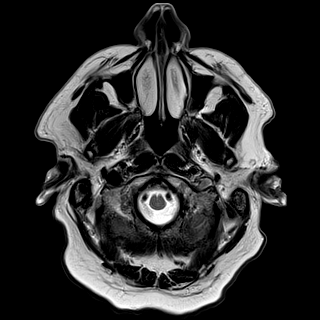
[im 11/22]
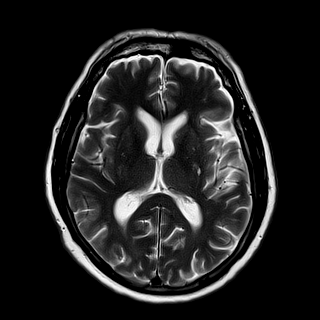
[im 22/22]
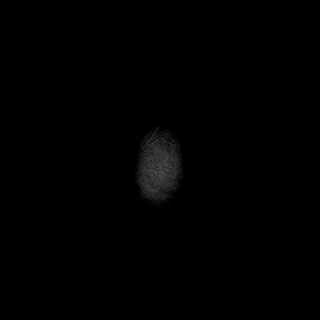

[Series 11: ax hemo · axial · 5.0mm · 0.86mm/px · z∈[-82,+60]mm · 3 of 25 slices shown]
[im 1/25]
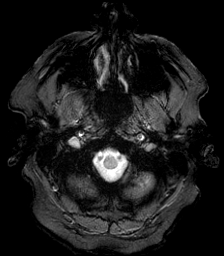
[im 13/25]
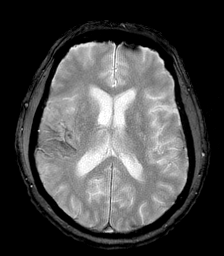
[im 25/25]
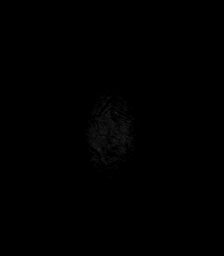

[Series 12: FLAIR · axial · 4.0mm · 0.43mm/px · z∈[-84,+62]mm · 5 of 38 slices shown]
[im 1/38]
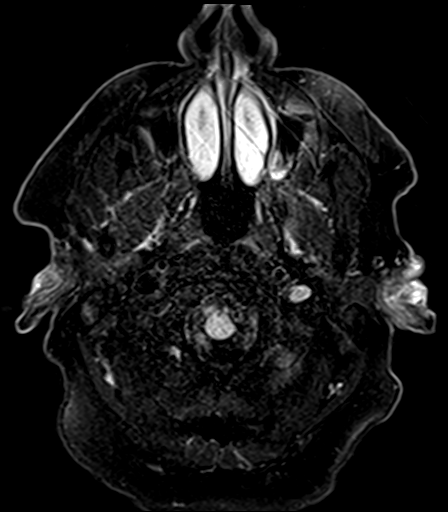
[im 10/38]
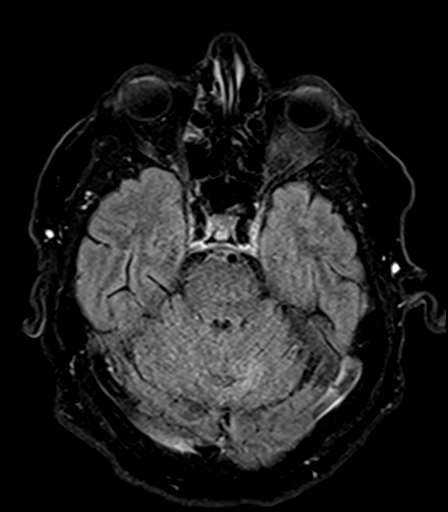
[im 19/38]
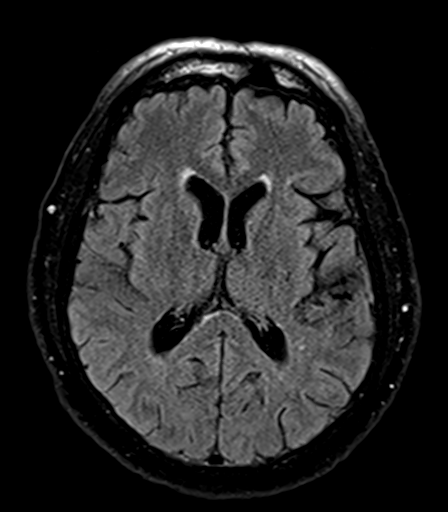
[im 28/38]
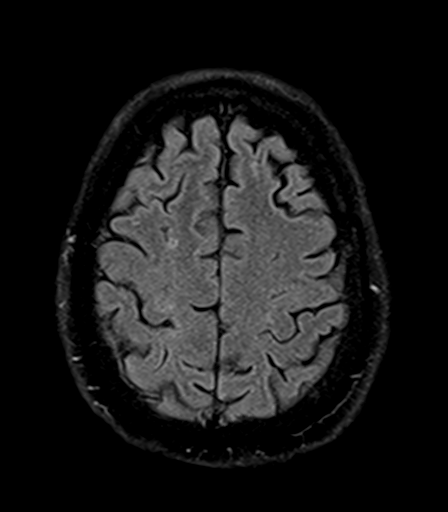
[im 38/38]
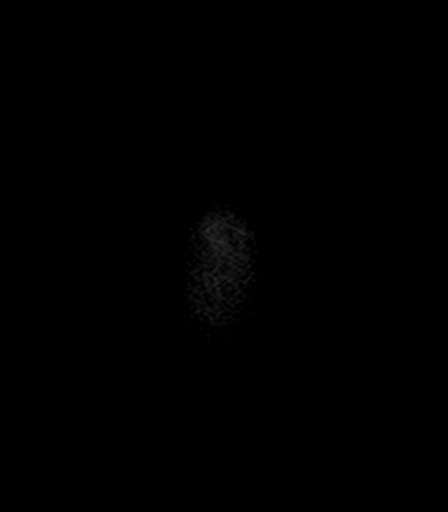

[Series 14: T2 · coronal · 5.0mm · 0.72mm/px · 4 of 28 slices shown (2 of 2)]
[im 1/28]
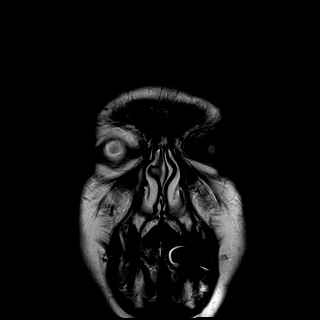
[im 10/28]
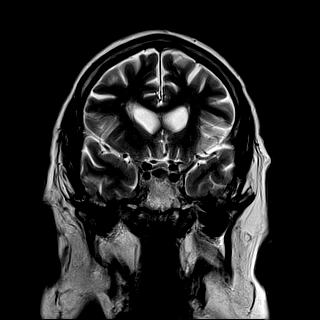
[im 19/28]
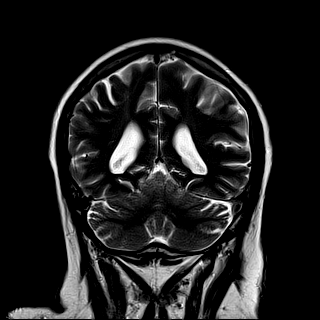
[im 28/28]
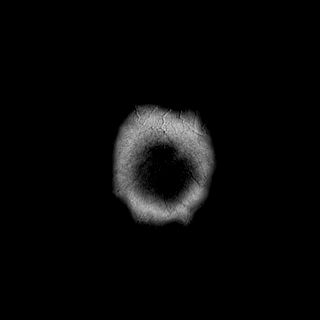

[41 of 48 positions shown; findings below may reference images not displayed]

FINDINGS: Brain:

Cerebral volume appears normal for age.

Redemonstrated small chronic cortical infarct within the right
frontal lobe precentral gyrus (series 12, image 30).

Multiple additional known small chronic infarcts within the right
cerebral cortex and right basal ganglia, predominantly better
appreciated on the prior brain MRI examinations of [DATE] and
[DATE] (acute/early subacute at time of these prior exams).

Mild multifocal T2 FLAIR hyperintense signal abnormality within the
cerebral white matter, nonspecific but compatible with chronic small
vessel ischemic disease.

Chronic hemosiderin deposition along the right sylvian fissure, and
right frontoparietal lobes, from prior subarachnoid hemorrhage at
this site.

Redemonstrated subcentimeter chronic infarct within the left
cerebellar hemisphere.

There is no acute infarct.

No evidence of an intracranial mass.

No extra-axial fluid collection.

No midline shift.

Vascular: Maintained flow voids within the proximal large arterial
vessels.

Skull and upper cervical spine: No focal suspicious marrow lesion.
Incompletely assessed cervical spondylosis. Posterior disc
osteophyte complex contributes to apparent at least mild spinal
canal stenosis at C3-C4.

Sinuses/Orbits: Visualized orbits show no acute finding. Mild
mucosal thickening within the bilateral ethmoid sinuses. Possible
small-volume fluid within a right ethmoid air cell.
IMPRESSION: No evidence of acute intracranial abnormality.

Known chronic infarcts within the right cerebral cortex and right
basal ganglia, as described.

Background mild chronic small vessel ischemic changes within the
cerebral white matter.

Chronic hemosiderin deposition along the right sylvian fissure, and
right frontoparietal lobes, from prior subarachnoid hemorrhage at
this site.

Redemonstrated subcentimeter chronic infarct within the left
cerebellar hemisphere.

Mild ethmoid sinusitis, as described.

Incompletely assessed cervical spondylosis.

## 2021-09-02 IMAGING — CT CT HEAD W/O CM
4 series · 16 of 47 positions shown, 18 images · non-contrast
Comparison: [DATE]

CLINICAL DATA: Left-sided numbness



[Series 2: head w o · axial · 0.42mm/px · z∈[+1271,+1391]mm · 7 of 33 slices shown, 9 images]
[im 5/33  brain]
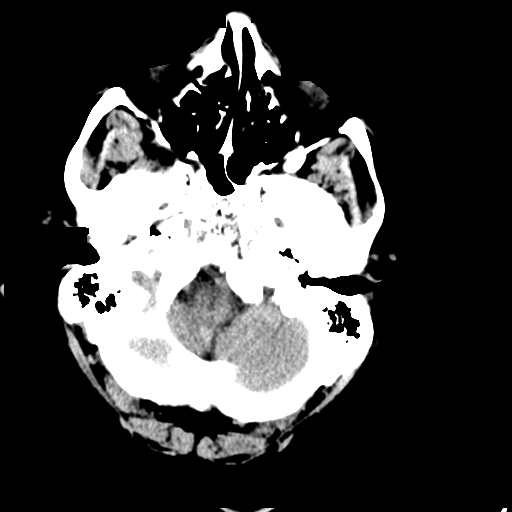
[im 5/33  bone]
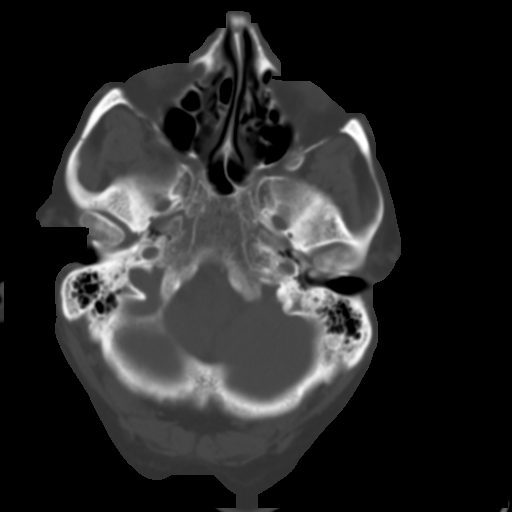
[im 9/33  brain]
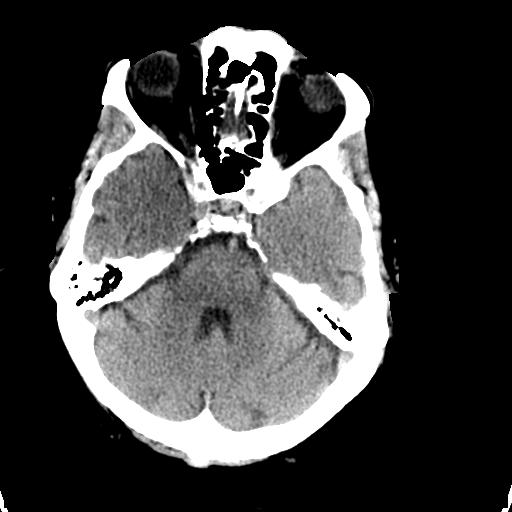
[im 13/33  brain]
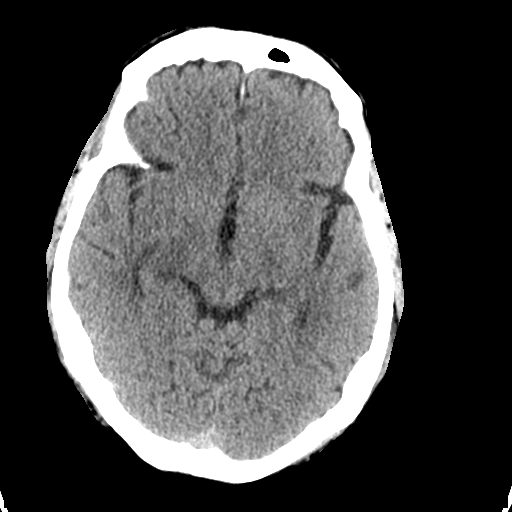
[im 17/33  brain]
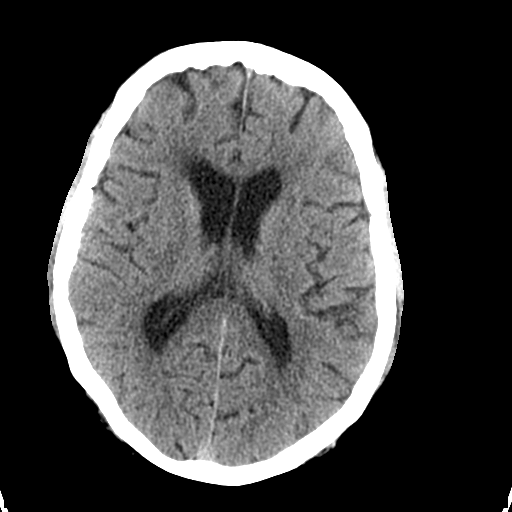
[im 21/33  brain]
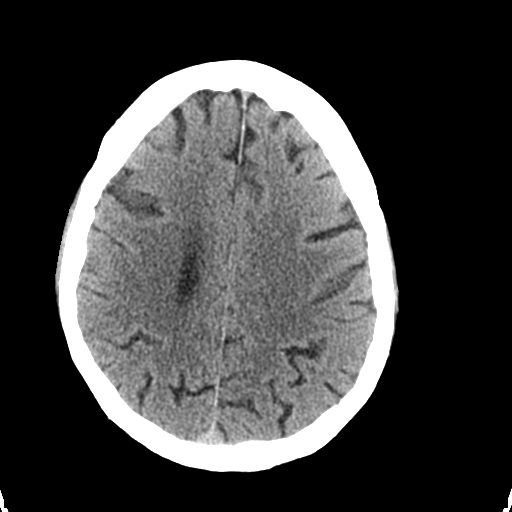
[im 21/33  bone]
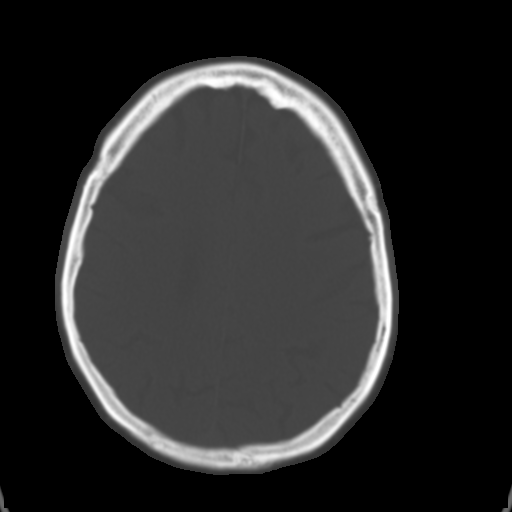
[im 25/33  brain]
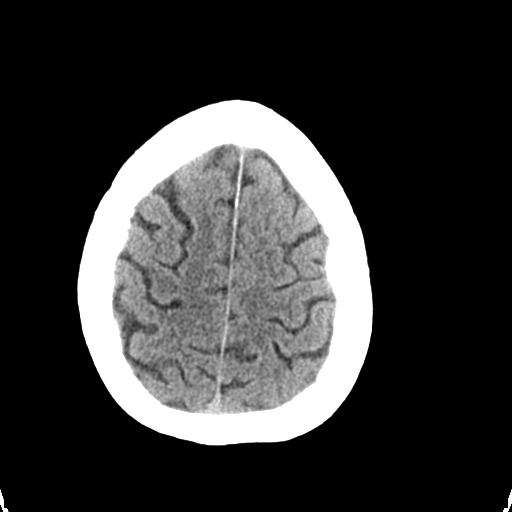
[im 29/33  brain]
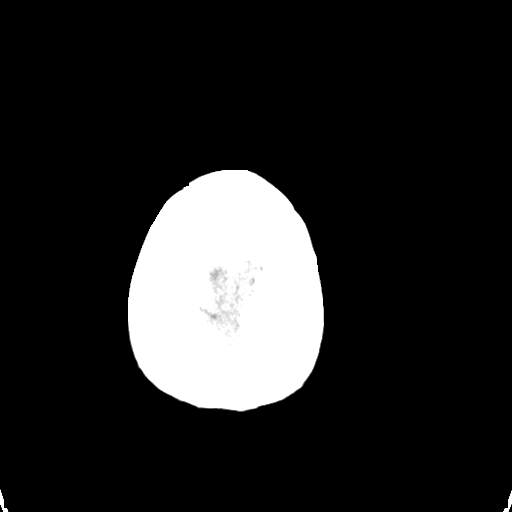

[Series 3: head bone · axial · 0.42mm/px · z∈[+1267,+1299]mm · 3 of 81 slices shown]
[im 9/81  bone]
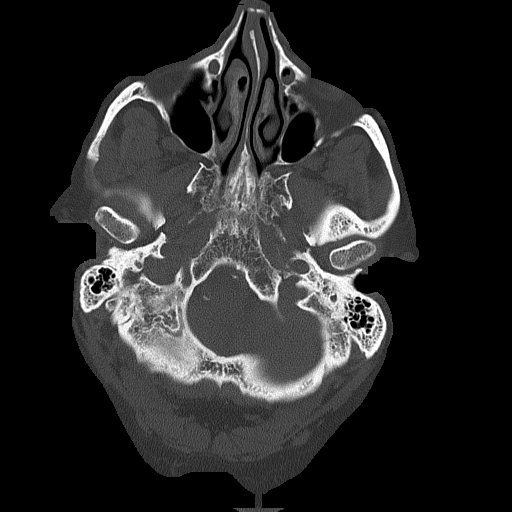
[im 17/81  bone]
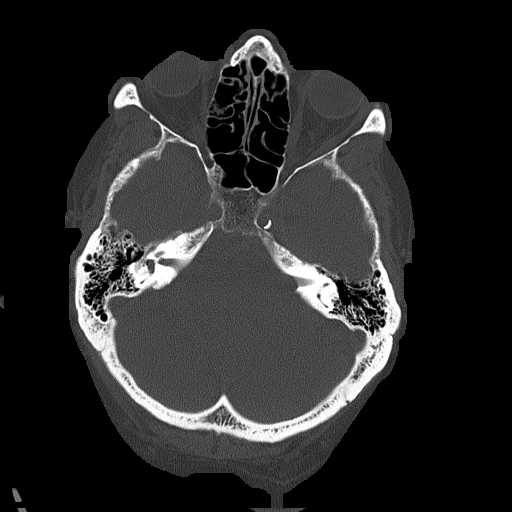
[im 25/81  bone]
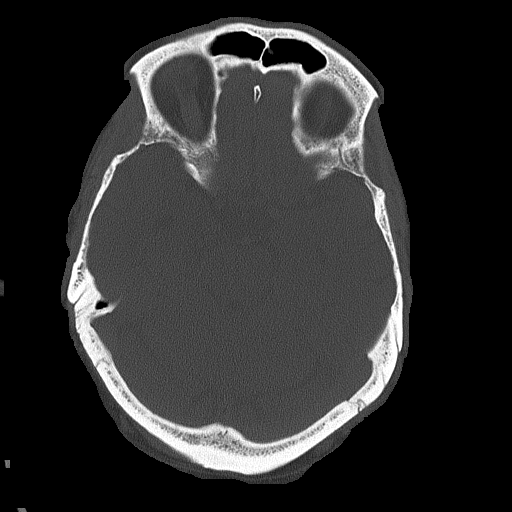

[Series 4: coronal soft · coronal · 0.33mm/px · 3 of 80 slices shown]
[im 27/80  brain]
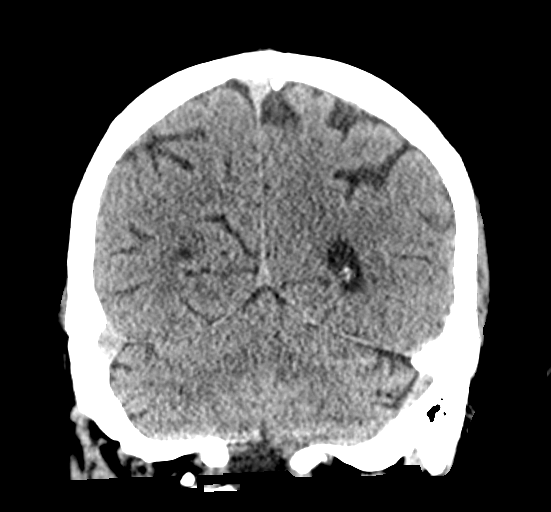
[im 36/80  brain]
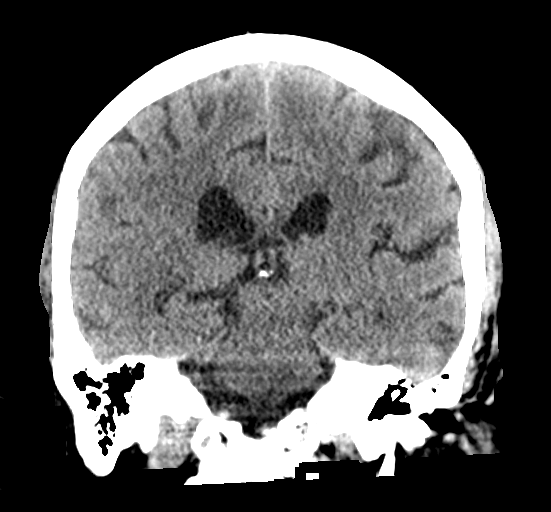
[im 44/80  brain]
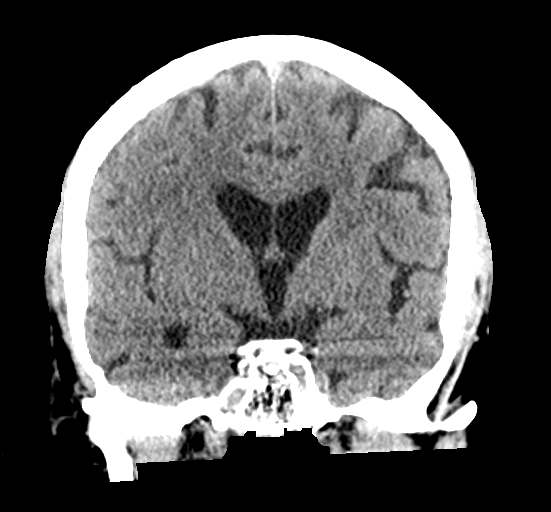

[Series 5: sagittal soft · sagittal · 0.34mm/px · 3 of 62 slices shown]
[im 21/62  brain]
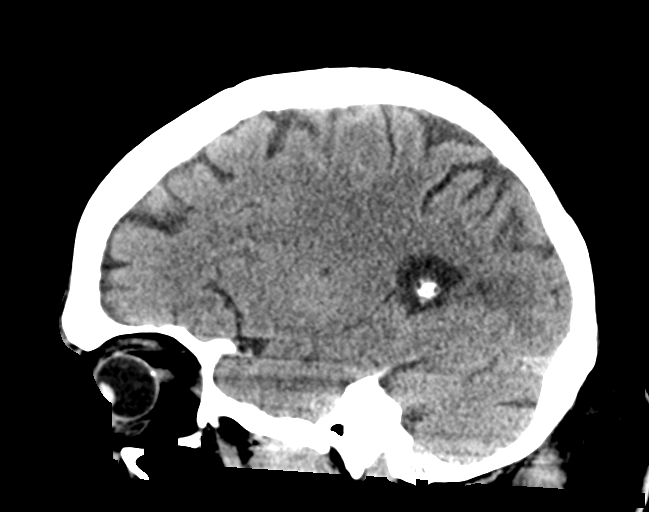
[im 31/62  brain]
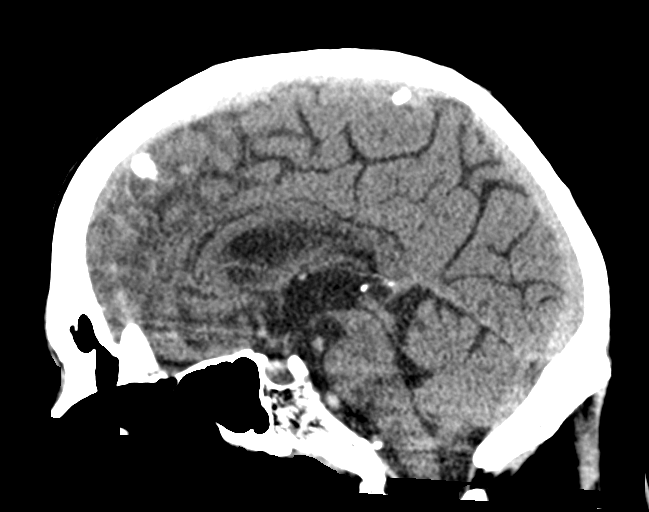
[im 41/62  brain]
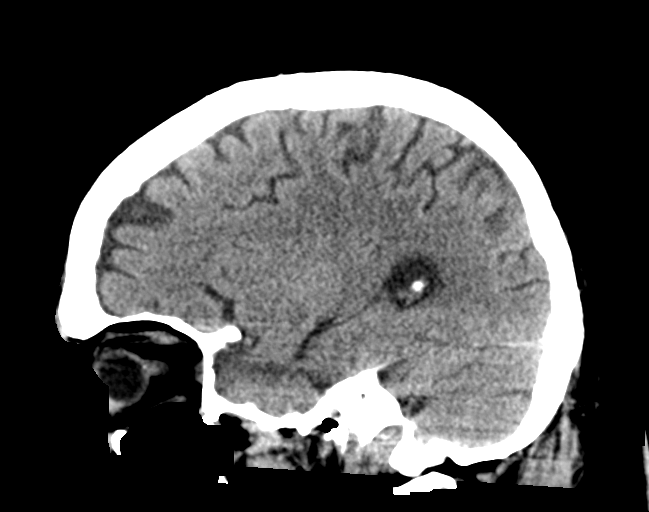

[16 of 47 positions shown; findings below may reference images not displayed]

FINDINGS: Brain: No evidence of acute infarction, hemorrhage, hydrocephalus,
extra-axial collection or mass lesion/mass effect. Scattered
low-density changes within the periventricular and subcortical white
matter compatible with chronic microvascular ischemic change.

Vascular: Atherosclerotic calcifications involving the large vessels
of the skull base. No unexpected hyperdense vessel.

Skull: Normal. Negative for fracture or focal lesion.

Sinuses/Orbits: No acute finding.

Other: None.
IMPRESSION: 1. No acute intracranial abnormality.
2. Mild chronic microvascular ischemic change.

## 2021-09-02 MED ORDER — ACETAMINOPHEN 650 MG RE SUPP: 650.0000 mg | RECTAL | Status: DC | PRN

## 2021-09-02 MED ORDER — ENOXAPARIN SODIUM 40 MG/0.4ML IJ SOSY
40.0000 mg | PREFILLED_SYRINGE | INTRAMUSCULAR | Status: DC
  Administered 2021-09-02: 40 mg via SUBCUTANEOUS
  Filled 2021-09-02: qty 0.4

## 2021-09-02 MED ORDER — LORAZEPAM 2 MG/ML IJ SOLN
1.0000 mg | Freq: Once | INTRAMUSCULAR | Status: AC
  Administered 2021-09-02: 1 mg via INTRAVENOUS
  Filled 2021-09-02: qty 1

## 2021-09-02 MED ORDER — STROKE: EARLY STAGES OF RECOVERY BOOK
Freq: Once | Status: AC
  Filled 2021-09-02: qty 1

## 2021-09-02 MED ORDER — ASPIRIN 81 MG PO CHEW
81.0000 mg | CHEWABLE_TABLET | Freq: Every day | ORAL | Status: DC
  Administered 2021-09-03: 81 mg via ORAL
  Filled 2021-09-02: qty 1

## 2021-09-02 MED ORDER — VITAMIN D 25 MCG (1000 UNIT) PO TABS
2000.0000 [IU] | ORAL_TABLET | Freq: Every day | ORAL | Status: DC
  Administered 2021-09-03: 2000 [IU] via ORAL
  Filled 2021-09-02 (×3): qty 2

## 2021-09-02 MED ORDER — TRAZODONE HCL 50 MG PO TABS: 25.0000 mg | ORAL_TABLET | Freq: Every evening | ORAL | Status: DC | PRN

## 2021-09-02 MED ORDER — SERTRALINE HCL 50 MG PO TABS: 50.0000 mg | ORAL_TABLET | Freq: Every day | ORAL | Status: DC

## 2021-09-02 MED ORDER — ATORVASTATIN CALCIUM 40 MG PO TABS: 80.0000 mg | ORAL_TABLET | Freq: Every day | ORAL | Status: DC

## 2021-09-02 MED ORDER — ACETAMINOPHEN 160 MG/5ML PO SOLN: 650.0000 mg | ORAL | Status: DC | PRN

## 2021-09-02 MED ORDER — TAMSULOSIN HCL 0.4 MG PO CAPS: 0.4000 mg | ORAL_CAPSULE | Freq: Every day | ORAL | Status: DC

## 2021-09-02 MED ORDER — TICAGRELOR 90 MG PO TABS
90.0000 mg | ORAL_TABLET | Freq: Two times a day (BID) | ORAL | Status: DC
  Administered 2021-09-02 – 2021-09-03 (×2): 90 mg via ORAL
  Filled 2021-09-02 (×2): qty 1

## 2021-09-02 MED ORDER — SENNOSIDES-DOCUSATE SODIUM 8.6-50 MG PO TABS: 1.0000 | ORAL_TABLET | Freq: Every evening | ORAL | Status: DC | PRN

## 2021-09-02 MED ORDER — SODIUM CHLORIDE 0.9 % IV SOLN: INTRAVENOUS | Status: DC

## 2021-09-02 MED ORDER — IOHEXOL 350 MG/ML SOLN
100.0000 mL | Freq: Once | INTRAVENOUS | Status: AC | PRN
  Administered 2021-09-02: 75 mL via INTRAVENOUS

## 2021-09-02 MED ORDER — SERTRALINE HCL 50 MG PO TABS
50.0000 mg | ORAL_TABLET | Freq: Every day | ORAL | Status: DC
  Administered 2021-09-03 (×2): 50 mg via ORAL
  Filled 2021-09-02: qty 1

## 2021-09-02 MED ORDER — PANTOPRAZOLE SODIUM 40 MG PO TBEC
40.0000 mg | DELAYED_RELEASE_TABLET | Freq: Every day | ORAL | Status: DC
  Administered 2021-09-03: 40 mg via ORAL
  Filled 2021-09-02: qty 1

## 2021-09-02 MED ORDER — ACETAMINOPHEN 325 MG PO TABS: 650.0000 mg | ORAL_TABLET | ORAL | Status: DC | PRN

## 2021-09-02 NOTE — ED Notes (Signed)
Transported to CT 

## 2021-09-02 NOTE — Assessment & Plan Note (Addendum)
s/p R ICA stent currently on asa and Brilnta. L ICA 50% stenosis per recent CTA head/neck. Had f/u with IR 12/22 with plans on repeating carotid ultrasound in April. Advised use of Brilinta will be managed by IR who will continue to prescribe and determine duration -2/13 CTA H&N--patent R-carotid stent;  Approximate 50% narrowing of the proximal left ICA. No other hemodynamically significant stenosis in the neck. No intracranial large vessel occlusion or significant stenosis

## 2021-09-02 NOTE — Assessment & Plan Note (Addendum)
Due to relative hypotension in evening time>>>> Hold evening dose acebutolol Continue aspirin and Brilinta Neurology consult appreciated 09/02/21 MRI brain--neg for acute findings 06/14/2021 LDL 85 06/13/2021 hemoglobin A1c 5.4 06/13/2021 echo EF 55-60%, no WMA, no PFO, normal RV 09/03/21 LDL 43 Orthostatics>>>drop in SBP 18 mmgHg>>given fluid 09/03/21--discussed with neurology--Dr. Kirkpatrick>>ok to d/c home and have EEG done as outpatient 09/03/21--pt feeling great, back to "normal"

## 2021-09-02 NOTE — ED Notes (Signed)
Attempted to call report.  RN not answering phone

## 2021-09-02 NOTE — Assessment & Plan Note (Signed)
BMI 34.17 Lifestyle modification

## 2021-09-02 NOTE — ED Provider Notes (Signed)
[facility] EMERGENCY DEPARTMENT Provider Note   CSN: [id number] Arrival date & time: 09/02/21  0092     History  Chief Complaint  Patient presents with   Cerebrovascular Accident    Stephen Hull is a 76 y.o. male.   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  Patient reports he has "not felt quite right" for the last 2 to 3 weeks.  He has been having transient weakness to the left side of his arms and legs since the stroke in November.  Reports this morning he woke up after going to bed without any deficits last night at 8 AM feeling like there is tingling on the left side of his neck.  This tingling sensation is intermittent, unable to identify any provoking or aggravating factors.  Does not feel like his previous stroke.  He is able to ambulate, states that the left side feels weaker but that comes and goes.   Of note, patient was recently seen on 08/28/2021 by his neurologist.  During their assessment they noted patient's been experiencing recurrent transient left-sided numbness present since his admission in 11/24 with recurrence on 06/26/2021.    Home Medications Prior to Admission medications   Medication Sig Start Date End Date Taking? Authorizing Provider  acebutolol (SECTRAL) 200 MG capsule Take 1 capsule by mouth 2 (two) times daily. 06/18/16  Yes [provider]  acetaminophen (TYLENOL) 325 MG tablet Take 650 mg by mouth every 6 (six) hours as needed for headache.   Yes [provider]  aspirin 81 MG chewable tablet Chew 1 tablet (81 mg total) by mouth daily. 06/19/21  Yes Leatha Gilding, MD  atorvastatin (LIPITOR) 80 MG tablet Take 1 tablet (80 mg total) by mouth daily. 06/19/21  Yes Leatha Gilding, MD  Cholecalciferol (VITAMIN D3) 2000 units TABS Take  2,000 Units by mouth daily.   Yes [provider]  losartan (COZAAR) 50 MG tablet Take 1 tablet by mouth daily. 06/18/16  Yes [provider]  omeprazole (PRILOSEC) 40 MG capsule Take 1 capsule by mouth daily. 06/18/16  Yes [provider]  sertraline (ZOLOFT) 50 MG tablet Take 50 mg by mouth daily.   Yes [provider]  tamsulosin (FLOMAX) 0.4 MG CAPS capsule Take 0.4 mg by mouth daily. 06/12/21  Yes [provider]  ticagrelor (BRILINTA) 90 MG TABS tablet Take 1 tablet (90 mg total) by mouth 2 (two) times daily. 06/19/21  Yes Leatha Gilding, MD  traZODone (DESYREL) 50 MG tablet Take 25-50 mg by mouth at bedtime as needed for sleep. 06/27/21  Yes [provider]      Allergies    Cashew nut (anacardium occidentale) skin test    Review of Systems   Review of Systems  Neurological:  Positive for numbness.   Physical Exam Updated Vital Signs BP (!) 156/77    Pulse (!) 58    Temp 98 F (36.7 C) (Oral)    Resp 16    Ht 5\' 11"  (1.803 m)    Wt 111.1 kg    SpO2 98%    BMI 34.17 kg/m  Physical Exam Vitals and nursing note reviewed. Exam conducted with a chaperone present.  Constitutional:      Appearance: Normal appearance.  HENT:     Head: Normocephalic and atraumatic.  Eyes:  General: No scleral icterus.       Right eye: No discharge.        Left eye: No discharge.     Extraocular Movements: Extraocular movements intact.     Pupils: Pupils are equal, round, and reactive to light.  Cardiovascular:     Rate and Rhythm: Normal rate and regular rhythm.     Pulses: Normal pulses.     Heart sounds: Normal heart sounds. No murmur heard.   No friction rub. No gallop.  Pulmonary:     Effort: Pulmonary effort is normal. No respiratory distress.     Breath sounds: Normal breath sounds.  Abdominal:     General: Abdomen is flat. Bowel sounds are normal. There is no distension.     Palpations: Abdomen is soft.     Tenderness: There  is no abdominal tenderness.  Skin:    General: Skin is warm and dry.     Coloration: Skin is not jaundiced.  Neurological:     Mental Status: He is alert and oriented to person, place, and time. Mental status is at baseline.     Coordination: Coordination normal.     Comments: Cranial nerves III through XII are grossly intact.  Grip strength is equal bilaterally, finger-nose within normal limit.  No pronator drift, sensation to the upper extremities and lower extremities roughly intact bilaterally.  Able to raise both lower extremities without any deficit.  No dysarthria, answers questions and follows commands well.    ED Results / Procedures / Treatments   Labs (all labs ordered are listed, but only abnormal results are displayed) Labs Reviewed  CBC - Abnormal; Notable for the following components:      Result Value   WBC 16.4 (*)    All other components within normal limits  DIFFERENTIAL - Abnormal; Notable for the following components:   Neutro Abs 12.9 (*)    Abs Immature Granulocytes 0.10 (*)    All other components within normal limits  COMPREHENSIVE METABOLIC PANEL - Abnormal; Notable for the following components:   Glucose, Bld 100 (*)    Creatinine, Ser 1.27 (*)    Total Bilirubin 2.0 (*)    GFR, Estimated 60 (*)    All other components within normal limits  RESP PANEL BY RT-PCR (FLU A&B, COVID) ARPGX2  PROTIME-INR  APTT  ETHANOL  RAPID URINE DRUG SCREEN, HOSP PERFORMED  URINALYSIS, ROUTINE W REFLEX MICROSCOPIC  CBG MONITORING, ED  CBG MONITORING, ED    EKG None  Radiology CT HEAD WO CONTRAST  Result Date: 09/02/2021 CLINICAL DATA:  Left-sided numbness EXAM: CT HEAD WITHOUT CONTRAST TECHNIQUE: Contiguous axial images were obtained from the base of the skull through the vertex without intravenous contrast. RADIATION DOSE REDUCTION: This exam was performed according to the departmental dose-optimization program which includes automated exposure control, adjustment  of the mA and/or kV according to patient size and/or use of iterative reconstruction technique. COMPARISON:  06/26/2021 FINDINGS: Brain: No evidence of acute infarction, hemorrhage, hydrocephalus, extra-axial collection or mass lesion/mass effect. Scattered low-density changes within the periventricular and subcortical white matter compatible with chronic microvascular ischemic change. Vascular: Atherosclerotic calcifications involving the large vessels of the skull base. No unexpected hyperdense vessel. Skull: Normal. Negative for fracture or focal lesion. Sinuses/Orbits: No acute finding. Other: None. IMPRESSION: 1. No acute intracranial abnormality. 2. Mild chronic microvascular ischemic change. Electronically Signed   By: Davina Poke D.O.   On: 09/02/2021 09:16    Procedures Procedures    Medications  Ordered in ED Medications - No data to display  ED Course/ Medical Decision Making/ A&P                           Medical Decision Making Amount and/or Complexity of Data Reviewed Labs: ordered. Radiology: ordered.  Risk Decision regarding hospitalization.   This patient presents to the ED for concern of left sided weakness, this involves an extensive number of treatment options, and is a complaint that carries with it a high risk of complications and morbidity.  The differential diagnosis includes TIA/stroke, paresthesias, other   Co morbidities that complicate the patient evaluation: Hyperlipidemia, hypertension, prior CVA, chronic anticoagulation, other   Additional history obtained: -Additional history obtained from chart review.  I personally reviewed patient's neurology note from earlier this week on 06/27/2022.  I also reviewed his MRI obtained in December 2022. -External records from outside source obtained and reviewed including: Chart review including previous notes, labs, imaging, consultation notes   Lab Tests: -I ordered, reviewed, and interpreted labs.  The  pertinent results include: Leukocytosis of 16.4 with left shift.  There is no gross electrolyte derangement underlying anemia.   EKG -Sinus rhythm without any ischemic findings.     Imaging Studies ordered: -I ordered imaging studies including CT head -I independently visualized and interpreted imaging which showed no acute process -I agree with the radiologist interpretation   Consultations Obtained: I requested consultation with the the on-call teleneurologist Dr. Corine Shelter.,  and discussed lab and imaging findings as well as pertinent plan - they recommend: 24-hour observation, admit to hospitalist and neurology will consult.   ED Course: Patient with transient left-sided numbness weakness.  The left tingling to the neck is acute today, there is no focal deficits on neuro exam.  He denies any missed doses of his Brilinta another medicine, however given his history of strokes there is concern for possible TIA.  CT head is reassuring, he does have a leukocytosis of 16.4. I did speak with the on-call neurologist, given patient is hypertensive and having the symptoms with concerning history he advises admission for 24 hours observation.  I think that is reasonable, I will consult with the hospitalist service.   Cardiac Monitoring: The patient was maintained on a cardiac monitor.  I personally viewed and interpreted the cardiac monitored which showed an underlying rhythm of: NSR   Reevaluation: After the interventions noted above, I reevaluated the patient and found that they have :stayed the same   Dispostion: -Admit          Final Clinical Impression(s) / ED Diagnoses Final diagnoses:  Left-sided weakness    Rx / DC Orders ED Discharge Orders     None         Sherrill Raring, Hershal Coria 09/02/21 1040    Godfrey Pick, MD 09/06/21 1258

## 2021-09-02 NOTE — ED Triage Notes (Signed)
Patient reports went to bed last night around 830-9pm and woke up this am around 6am with left sided weakness and numbness. LVO-.
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
## 2021-09-02 NOTE — ED Notes (Signed)
Urinal at bedside. Patient aware of need for urine specimen.

## 2021-09-02 NOTE — Progress Notes (Signed)
SLP Cancellation Note  Patient Details Name: Stephen Hull MRN: [id number] DOB: 1946/01/26   Cancelled treatment:       Reason Eval/Treat Not Completed: SLP screened, no needs identified, will sign off; SLP screened Pt in room. Pt denies any changes in swallowing, speech, language, or cognition. MRI negative for acute changes. SLE will be deferred at this time. Reconsult if indicated. SLP will sign off.  Thank you,  Havery Moros, CCC-SLP [phone]    Jilda Kress 09/02/2021, 4:30 PM

## 2021-09-02 NOTE — Assessment & Plan Note (Addendum)
Restart acebutolol at 1/2 home dose as pt had low BP 89/64 at 2200 on 2/13 eve Restart losartan

## 2021-09-02 NOTE — Assessment & Plan Note (Addendum)
Residual deficit: OD visual loss - stable.followed by ophthalmology

## 2021-09-02 NOTE — Consult Note (Signed)
[facility] Telemedicine Consult   Requesting Provider: Tat, D Consult Participants: Patient, wife Location of the provider: [place], [place] Location of the patient: [facility]  This consult was provided via telemedicine with 2-way video and audio communication. The patient/family was informed that care would be provided in this way and agreed to receive care in this manner.    Chief Complaint: left sided weakness  HPI: 76 yo with a history of a recent stroke who was found to have a likely CRAO from right carotid near occlusion with questionable dissection back in late November.  He had a stent placed and since that time has had waxing and waning left-sided weakness.  He states that he notices it most when he wakes up in the morning, it will not be present while in bed but when he gets up and starts moving around he will notice some tingling on the left side.  This improves over the course of the day.  It was slightly more prominent today, and therefore he sought care in the emergency department, but the symptoms were nothing new and have been happening every few days since he was discharged.    LKW: 2/12 8:30 p.m. tpa given?: No, mild symptoms  Exam: Vitals:   09/02/21 1159 09/02/21 1401  BP: (!) 162/82 111/83  Pulse: (!) 55 (!) 59  Resp: 18 16  Temp: 97.9 F (36.6 C) 97.9 F (36.6 C)  SpO2: 100% 96%    General: 0  1A: Level of Consciousness - 0 1B: Ask Month and Age - 0 1C: 'Blink Eyes' & 'Squeeze Hands' - 0 2: Test Horizontal Extraocular Movements - 0 3: Test Visual Fields - 0 4: Test Facial Palsy - 0 5A: Test Left Arm Motor Drift - 0 5B: Test Right Arm Motor Drift - 0 6A: Test Left Leg Motor Drift - 0 6B: Test Right Leg Motor Drift - 0 7: Test Limb Ataxia - 0 8: Test Sensation - 0 9: Test Language/Aphasia- 0 10: Test Dysarthria - 0 11: Test Extinction/Inattention - 0 NIHSS score: 0   Imaging Reviewed: MRI brain -no acute findings,  redemonstrated previous area of chronic hemorrhage  Labs reviewed in epic and pertinent values follow: Creatinine 1.27   Assessment: 76 year old male with a history of recent carotid stenting who presents with recurrent episodes of transient left-sided tingling/weakness.  Possibilities include in-stent stenosis, waxing waning of underlying deficits, orthostasis in the setting of underlying deficits, or given that he has had some sulcal subarachnoid I would include seizure in the differential.  At this time, I would assess for these with a CTA head and neck, EEG, and orthostatic vital signs.  Recommendations: 1) CT angio head and neck 2) EEG 3) orthostatic vital signs 4) continue home aspirin and Brilinta    Roland Rack, MD [facility] [phone]  If 7pm- 7am, please page neurology on call as listed in [facility].

## 2021-09-02 NOTE — Assessment & Plan Note (Addendum)
Continue statin 09/02/21 LDL improved to 43

## 2021-09-02 NOTE — Progress Notes (Signed)
Pt arrived to room 333 via WC from ED. Pt ambulatory from [facility] to bed and then from bed to standing weight scale without difficulty or assistance. PT A&O, denies c/o. Oriented to room and safety procedures, states understanding. Placed on tele per order. IV site WNL, flushes easily. Daughter at bedside. Pt notified of pending tests and plan of care discussed, both pt and daughter state understanding.

## 2021-09-03 DIAGNOSIS — H3410 Central retinal artery occlusion, unspecified eye: Secondary | ICD-10-CM | POA: Diagnosis not present

## 2021-09-03 DIAGNOSIS — I10 Essential (primary) hypertension: Secondary | ICD-10-CM | POA: Diagnosis not present

## 2021-09-03 DIAGNOSIS — R531 Weakness: Secondary | ICD-10-CM | POA: Diagnosis not present

## 2021-09-03 DIAGNOSIS — R209 Unspecified disturbances of skin sensation: Secondary | ICD-10-CM | POA: Diagnosis not present

## 2021-09-03 LAB — CBC WITH DIFFERENTIAL/PLATELET
Abs Immature Granulocytes: 0.13 10*3/uL — ABNORMAL HIGH (ref 0.00–0.07)
Basophils Absolute: 0.1 10*3/uL (ref 0.0–0.1)
Basophils Relative: 1 %
Eosinophils Absolute: 0.4 10*3/uL (ref 0.0–0.5)
Eosinophils Relative: 3 %
HCT: 47.6 % (ref 39.0–52.0)
Hemoglobin: 15 g/dL (ref 13.0–17.0)
Immature Granulocytes: 1 %
Lymphocytes Relative: 14 %
Lymphs Abs: 2.2 10*3/uL (ref 0.7–4.0)
MCH: 29.2 pg (ref 26.0–34.0)
MCHC: 31.5 g/dL (ref 30.0–36.0)
MCV: 92.6 fL (ref 80.0–100.0)
Monocytes Absolute: 1 10*3/uL (ref 0.1–1.0)
Monocytes Relative: 6 %
Neutro Abs: 11.9 10*3/uL — ABNORMAL HIGH (ref 1.7–7.7)
Neutrophils Relative %: 75 %
Platelets: 234 10*3/uL (ref 150–400)
RBC: 5.14 MIL/uL (ref 4.22–5.81)
RDW: 14.6 % (ref 11.5–15.5)
WBC: 15.8 10*3/uL — ABNORMAL HIGH (ref 4.0–10.5)
nRBC: 0 % (ref 0.0–0.2)

## 2021-09-03 LAB — LIPID PANEL
Cholesterol: 104 mg/dL (ref 0–200)
HDL: 38 mg/dL — ABNORMAL LOW (ref >40)
LDL Cholesterol: 43 mg/dL (ref 0–99)
Total CHOL/HDL Ratio: 2.7 RATIO
Triglycerides: 113 mg/dL (ref <150)
VLDL: 23 mg/dL (ref 0–40)

## 2021-09-03 LAB — HEMOGLOBIN A1C
Hgb A1c MFr Bld: 5.3 % (ref 4.8–5.6)
Mean Plasma Glucose: 105.41 mg/dL

## 2021-09-03 MED ORDER — SODIUM CHLORIDE 0.9 % IV BOLUS
1000.0000 mL | Freq: Once | INTRAVENOUS | Status: AC
  Administered 2021-09-03: 1000 mL via INTRAVENOUS

## 2021-09-03 MED ORDER — ACEBUTOLOL HCL 200 MG PO CAPS: 200.0000 mg | ORAL_CAPSULE | Freq: Every day | ORAL | Status: DC
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
## 2021-09-03 NOTE — Care Management Obs Status (Signed)
[facility] NOTIFICATION   Patient Details  Name: Stephen Hull MRN: [id number] Date of Birth: Jul 25, 1945   Medicare Observation Status Notification Given:  Yes    Ihor Gully, LCSW 09/03/2021, 12:55 PM

## 2021-09-04 NOTE — Progress Notes (Signed)
I agree with the above plan 

## 2021-09-16 ENCOUNTER — Telehealth (HOSPITAL_COMMUNITY): Payer: Self-pay

## 2021-09-16 NOTE — Progress Notes (Signed)
Entering Modified Rankin Score for Stroke data collection.  Score based on review of medical record from admission on 09/02/21 at [facility].      09/03/21 1700  Modified Rankin (Stroke Patients Only)  Modified Rankin Java, PT   Acute Rehabilitation Services  Pager [phone] Office [phone] 09/16/2021

## 2021-09-16 NOTE — Telephone Encounter (Signed)
Pt agreed to f/u in 6 months with a US carotid. Would like to discuss how long he will need to continue Brilinta after next f/u. AW

## 2021-09-25 ENCOUNTER — Telehealth: Payer: Self-pay

## 2021-09-25 ENCOUNTER — Encounter (HOSPITAL_COMMUNITY): Payer: Medicare HMO

## 2021-09-25 NOTE — Telephone Encounter (Signed)
? ?  Pre-operative Risk Assessment  ?  ?Patient Name: Stephen Hull  ?DOB: 31-May-1946 ?MRN: [id number] ? ?Request for Surgical Clearance ?  ? ?Procedure:  Dental Extraction - Amount of Teeth to be Pulled:  1 ? ?Date of Surgery:  Clearance 10/03/21                              ? ?Surgeon:  (unable to read provider signature) ?Surgeon's Group or Practice Name:  [facility] ?Phone number:  [phone] ?Fax number:  [phone] ? ?Type of Clearance Requested:   ?- Pharmacy:  Hold Ticagrelor (Brilinta)   ? ?Type of Anesthesia:  Local  ?  ?Additional requests/questions:   N/A ? ?Signed, ?Kandice Robinsons T   ?09/25/2021, 4:36 PM  ? ?

## 2021-09-25 NOTE — Telephone Encounter (Signed)
Patient takes Brilinta for history of stroke. Please contact neurologist. ?

## 2021-10-01 ENCOUNTER — Telehealth: Payer: Self-pay | Admitting: Internal Medicine

## 2021-10-01 NOTE — Progress Notes (Signed)
Pt called and spoke with Miles Costain requesting instruction for how long to hold Brilinta for a tooth extraction. I returned pt call and instructed him to hold Brilinta 3 days prior to extraction, but to continue taking his aspirin. Pt advised that he can resume his Brilinta 2 days after his tooth extraction.  ?Pt verbalized understanding of these instructions.  ? ? ? ?Narda Rutherford, AGNP-BC ?10/01/2021, 2:52 PM ? ? ?

## 2021-10-23 ENCOUNTER — Telehealth (HOSPITAL_COMMUNITY): Payer: Self-pay | Admitting: Radiology

## 2021-10-23 NOTE — Telephone Encounter (Signed)
Pt called and wanted to know when his next f/u scan is due. Per Dr. Corliss Skains he is to have a carotid US in August. Patient also wanted to know if he had to stay on the Brilinta until that time. Per Deveshwar pt is to continue with the Brilinta until the next f/u and he will decide at that time about the Brilinta. Pt states understanding and is in agreement with that plan of care. JM ?

## 2021-11-07 ENCOUNTER — Encounter: Payer: Self-pay | Admitting: Neurology

## 2021-11-07 ENCOUNTER — Ambulatory Visit (INDEPENDENT_AMBULATORY_CARE_PROVIDER_SITE_OTHER): Payer: Medicare HMO | Admitting: Neurology

## 2021-11-07 VITALS — BP 137/90 | HR 89 | Ht 71.0 in | Wt 238.7 lb

## 2021-11-07 DIAGNOSIS — R0683 Snoring: Secondary | ICD-10-CM

## 2021-11-07 DIAGNOSIS — R351 Nocturia: Secondary | ICD-10-CM

## 2021-11-07 DIAGNOSIS — Z820 Family history of epilepsy and other diseases of the nervous system: Secondary | ICD-10-CM

## 2021-11-07 DIAGNOSIS — Z8673 Personal history of transient ischemic attack (TIA), and cerebral infarction without residual deficits: Secondary | ICD-10-CM

## 2021-11-07 DIAGNOSIS — Z95828 Presence of other vascular implants and grafts: Secondary | ICD-10-CM

## 2021-11-07 DIAGNOSIS — G4719 Other hypersomnia: Secondary | ICD-10-CM | POA: Diagnosis not present

## 2021-11-07 DIAGNOSIS — E669 Obesity, unspecified: Secondary | ICD-10-CM

## 2021-11-07 NOTE — Progress Notes (Signed)
Subjective:  ?  ?Patient ID: Stephen Hull is a 76 y.o. male. ? ?HPI ? ? ? ?Star Age, MD, PhD ?[facility] ?[employer] [employer], [address] ?[phone] ?[place], [place] [address] ? ?Dear Janett Billow and Mamie Nick,  ? ?I saw your patient, Stephen Hull, upon your kind request in my sleep clinic today for initial consultation of his sleep disorder, in particular, concern for underlying obstructive sleep apnea.  The patient is unaccompanied today.  As you know, [name] is a 76 year old right-handed gentleman with an underlying medical history of bradycardia, smoking, hypertension, PSVT, stroke in November 2022 with status post right ICA stenting and status post right MCA thrombectomy, SAH, and obesity, who reports snoring as well as witnessed apneas per wife's report. I reviewed your office note from 08/28/2021.  His Epworth sleepiness score is 4 out of the fatigue severity score is 58 out of 63.  He endorses feeling tired since his stroke.  He lives with his wife of 45 years.  He has nocturia about twice per average night, denies recurrent morning headaches.  His twin brother has sleep apnea and uses a CPAP machine.  Patient goes to bed generally around 7 or 8 PM and watches TV for couple of hours.  He turns the TV off before falling asleep.  Rise time is generally between 6 AM and 6:30 AM.  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  He would prefer a home sleep test.  He drinks caffeine in the form of coffee, about 2 cups in the morning, no alcohol currently, quit smoking in 1991.  He lost about 30 pounds since his stroke.  He has been on trazodone as needed for the past 4 to 5 months per PCP. ? ?His Past Medical History Is Significant For: ?Past Medical History:  ?Diagnosis Date  ? Bradycardia   ? History of tobacco use   ? quit in 1991  ? Hypertension   ? Paroxysmal SVT (supraventricular tachycardia) (HCC)   ? Stroke [facility])   ? ? ?His Past Surgical History Is Significant  For: ?Past Surgical History:  ?Procedure Laterality Date  ? COLONOSCOPY    ? COLONOSCOPY N/A 11/14/2016  ? Procedure: COLONOSCOPY;  Surgeon: Daneil Dolin, MD;  Location: [facility];  Service: Endoscopy;  Laterality: N/A;  730 ?  ? IR ANGIO INTRA EXTRACRAN SEL COM CAROTID INNOMINATE BILAT MOD SED  06/14/2021  ? IR ANGIO INTRA EXTRACRAN SEL INTERNAL CAROTID UNI R MOD SED  06/17/2021  ? IR ANGIO VERTEBRAL SEL VERTEBRAL UNI R MOD SED  06/14/2021  ? IR CT HEAD LTD  06/17/2021  ? IR INTRAVSC STENT CERV CAROTID W/EMB-PROT MOD SED INCL ANGIO  06/17/2021  ? IR PERCUTANEOUS ART THROMBECTOMY/INFUSION INTRACRANIAL INC DIAG ANGIO  06/17/2021  ? IR RADIOLOGIST EVAL & MGMT  07/13/2021  ? IR US GUIDE VASC ACCESS RIGHT  06/14/2021  ? RADIOLOGY WITH ANESTHESIA N/A 06/17/2021  ? Procedure: IR WITH ANESTHESIA;  Surgeon: Luanne Bras, MD;  Location: [facility];  Service: Radiology;  Laterality: N/A;  ? TONSILLECTOMY    ? as a kid  ? ? ?His Family History Is Significant For: ?Family History  ?Problem Relation Age of Onset  ? Cancer Mother   ?     Stomach  ? Cancer - Prostate Father   ? Heart attack Father   ? Sleep apnea Brother   ? Colon cancer Neg Hx   ? ? ?His Social History Is  Significant For: ?Social History  ? ?Socioeconomic History  ? Marital status: Married  ?  Spouse name: Candida Peeling  ? Number of children: Not on file  ? Years of education: Not on file  ? Highest education level: Not on file  ?Occupational History  ? Not on file  ?Tobacco Use  ? Smoking status: Former  ?  Types: Cigarettes  ?  Quit date: 07/21/1989  ?  Years since quitting: 32.3  ? Smokeless tobacco: Former  ?  Quit date: 07/21/1989  ?Vaping Use  ? Vaping Use: Never used  ?Substance and Sexual Activity  ? Alcohol use: No  ? Drug use: No  ? Sexual activity: Not on file  ?Other Topics Concern  ? Not on file  ?Social History Narrative  ? Not on file  ? ?Social Determinants of Health  ? ?Financial Resource Strain: Low Risk   ? Difficulty of Paying Living Expenses: Not  hard at all  ?Food Insecurity: No Food Insecurity  ? Worried About Charity fundraiser in the Last Year: Never true  ? Ran Out of Food in the Last Year: Never true  ?Transportation Needs: No Transportation Needs  ? Lack of Transportation (Medical): No  ? Lack of Transportation (Non-Medical): No  ?Physical Activity: Not on file  ?Stress: No Stress Concern Present  ? Feeling of Stress : Only a little  ?Social Connections: Unknown  ? Frequency of Communication with Friends and Family: More than three times a week  ? Frequency of Social Gatherings with Friends and Family: More than three times a week  ? Attends Religious Services: Not on file  ? Active Member of Clubs or Organizations: Yes  ? Attends Archivist Meetings: More than 4 times per year  ? Marital Status: Married  ? ? ?His Allergies Are:  ?Allergies  ?Allergen Reactions  ? Cashew Nut (Anacardium Occidentale) Skin Test Other (See Comments)  ?  Makes him sick ?Makes him sick ?  ?:  ? ?His Current Medications Are:  ?Outpatient Encounter Medications as of 11/07/2021  ?Medication Sig  ? acebutolol (SECTRAL) 200 MG capsule Take 1 capsule (200 mg total) by mouth daily.  ? acetaminophen (TYLENOL) 325 MG tablet Take 650 mg by mouth every 6 (six) hours as needed for headache.  ? aspirin 81 MG chewable tablet Chew 1 tablet (81 mg total) by mouth daily.  ? atorvastatin (LIPITOR) 80 MG tablet Take 1 tablet (80 mg total) by mouth daily.  ? Cholecalciferol (VITAMIN D3) 2000 units TABS Take 2,000 Units by mouth daily.  ? losartan (COZAAR) 50 MG tablet Take 1 tablet by mouth daily.  ? omeprazole (PRILOSEC) 40 MG capsule Take 1 capsule by mouth daily.  ? sertraline (ZOLOFT) 50 MG tablet Take 50 mg by mouth daily.  ? tamsulosin (FLOMAX) 0.4 MG CAPS capsule Take 0.4 mg by mouth daily.  ? ticagrelor (BRILINTA) 90 MG TABS tablet Take 1 tablet (90 mg total) by mouth 2 (two) times daily.  ? traZODone (DESYREL) 50 MG tablet Take 25-50 mg by mouth at bedtime as needed for  sleep.  ? ?No facility-administered encounter medications on file as of 11/07/2021.  ?: ? ? ?Review of Systems:  ?Out of a complete 14 point review of systems, all are reviewed and negative with the exception of these symptoms as listed below: ? ?Review of Systems  ?Neurological:   ?     Pt is here for sleep consult . Pt states he snores,hypertension,fatigue. Pt denies headaches,sleep study,CPAP  machine.Pt states he had a stroke 05/2021  ? ?ESS:4 ?FSS:58  ? ?Objective:  ?Neurological Exam ? ?Physical Exam ?Physical Examination:  ? ?Vitals:  ? 11/07/21 0901  ?BP: 137/90  ?Pulse: 89  ? ? ?General Examination: The patient is a very pleasant 76 y.o. male in no acute distress. He appears well-developed and well-nourished and well groomed.  ? ?HEENT: Normocephalic, atraumatic, pupils are equal, round and reactive to light, extraocular tracking is good without limitation to gaze excursion or nystagmus noted. Hearing is grossly intact. Face is symmetric with normal facial animation. Speech is clear with no dysarthria noted. There is no hypophonia. There is no lip, neck/head, jaw or voice tremor. Neck is supple with full range of passive and active motion. There are no carotid bruits on auscultation. Oropharynx exam reveals: mild mouth dryness, adequate dental hygiene, moderate airway crowding secondary to small airway, teeth without significant overbite, mildly skewed teeth alignment.  Tonsils absent, Mallampati class IV.  Neck circumference of 19 inches.  Tongue protrudes centrally.   ? ?Chest: Clear to auscultation without wheezing, rhonchi or crackles noted. ? ?Heart: S1+S2+0, regular and normal without murmurs, rubs or gallops noted.  ? ?Abdomen: Soft, non-tender and non-distended with normal bowel sounds appreciated on auscultation. ? ?Extremities: There is no pitting edema in the distal lower extremities bilaterally.  ? ?Skin: Warm and dry without trophic changes noted.  ? ?Musculoskeletal: exam reveals no obvious  joint deformities, tenderness or joint swelling or erythema.  ? ?Neurologically:  ?Mental status: The patient is awake, alert and oriented in all 4 spheres. His immediate and remote memory, attention, language ski

## 2021-11-07 NOTE — Patient Instructions (Addendum)
Thank you for choosing [facility] for your sleep related care! It was nice to meet you today!  ? ?Here is what we discussed today:  ?  ?Based on your symptoms and your exam I believe you are at risk for obstructive sleep apnea (aka OSA). We should proceed with a sleep study to determine whether you do or do not have OSA and how severe it is. Even, if you have mild OSA, I may want you to consider treatment with CPAP, as treatment of even borderline or mild sleep apnea can result and improvement of symptoms such as sleep disruption, daytime sleepiness, nighttime bathroom breaks, restless leg symptoms, improvement of headache syndromes, even improved mood disorder.  ? ?As explained, an attended sleep study (meaning you get to stay overnight in the sleep lab), lets [place] monitor sleep-related behaviors such as sleep talking and leg movements in sleep, in addition to monitoring for sleep apnea.  A home sleep test is a screening tool for sleep apnea diagnosis only, but unfortunately, does not help with any other sleep-related diagnoses. You prefer to do a home sleep test, which I ordered today.  ? ?Please remember, the long-term risks and ramifications of untreated moderate to severe obstructive sleep apnea may include (but are not limited to): increased risk for cardiovascular disease, including congestive heart failure, stroke, difficult to control hypertension, treatment resistant obesity, arrhythmias, especially irregular heartbeat commonly known as A. Fib. (atrial fibrillation); even type 2 diabetes has been linked to untreated OSA.  ? ?Other correlations that untreated obstructive sleep apnea include macular edema which is swelling of the retina in the eyes, droopy eyelid syndrome, and elevated hemoglobin and hematocrit levels (often referred to as polycythemia). ? ?Sleep apnea can cause disruption of sleep and sleep deprivation in most cases, which, in turn, can cause recurrent headaches, problems  with memory, mood, concentration, focus, and vigilance. Most people with untreated sleep apnea report excessive daytime sleepiness, which can affect their ability to drive. Please do not drive or use heavy equipment or machinery, if you feel sleepy! Patients with sleep apnea can also develop difficulty initiating and maintaining sleep (aka insomnia).  ? ?Having sleep apnea may increase your risk for other sleep disorders, including involuntary behaviors sleep such as sleep terrors, sleep talking, sleepwalking.   ? ?Having sleep apnea can also increase your risk for restless leg syndrome and leg movements at night.  ? ?Please note that untreated obstructive sleep apnea may carry additional perioperative morbidity. Patients with significant obstructive sleep apnea (typically, in the moderate to severe degree) should receive, if possible, perioperative PAP (positive airway pressure) therapy and the surgeons and particularly the anesthesiologists should be informed of the diagnosis and the severity of the sleep disordered breathing.  ? ?We will call you or email you through [facility] with regards to your test results and plan a follow-up in sleep clinic accordingly. ?Most likely, you will hear from one of our nurses.  ? ?Our sleep lab administrative assistant will call you to schedule your sleep study and give you further instructions, regarding the check in process for the sleep study, arrival time, what to bring, when you can expect to leave after the study, etc., and to answer any other logistical questions you may have. If you don't hear back from her by about 2 weeks from now, please feel free to call her direct line at [phone] or you can call our general clinic number, or email [place] through My Chart.   ?

## 2021-12-13 ENCOUNTER — Telehealth: Payer: Self-pay | Admitting: Neurology

## 2021-12-13 NOTE — Telephone Encounter (Signed)
HST- [employer] no auth req spoke to [place] W ref # [id number], patient is scheduled to pick up at [facility] on 01/22/22 at 9:30 AM.

## 2022-01-09 ENCOUNTER — Ambulatory Visit: Payer: Medicare HMO | Admitting: Cardiovascular Disease

## 2022-01-09 ENCOUNTER — Encounter: Payer: Self-pay | Admitting: Cardiovascular Disease

## 2022-01-09 DIAGNOSIS — E782 Mixed hyperlipidemia: Secondary | ICD-10-CM

## 2022-01-09 DIAGNOSIS — I6523 Occlusion and stenosis of bilateral carotid arteries: Secondary | ICD-10-CM

## 2022-01-09 DIAGNOSIS — I471 Supraventricular tachycardia: Secondary | ICD-10-CM

## 2022-01-09 DIAGNOSIS — I10 Essential (primary) hypertension: Secondary | ICD-10-CM | POA: Diagnosis not present

## 2022-01-09 DIAGNOSIS — I779 Disorder of arteries and arterioles, unspecified: Secondary | ICD-10-CM | POA: Insufficient documentation

## 2022-01-09 NOTE — Assessment & Plan Note (Signed)
History of essential hypertension blood pressure measured today at 130/90.  He is on losartan.  I reviewed his blood pressure readings at home which look like they for the most part are all in the normal range.  He was taken off Sectral all because of relative hypotension and

## 2022-01-09 NOTE — Assessment & Plan Note (Signed)
History of hyperlipidemia on high-dose atorvastatin with lipid profile performed 09/03/2021 revealing total cholesterol 104, LDL 43 and HDL 38.

## 2022-01-09 NOTE — Patient Instructions (Signed)
Medication Instructions:  ?Your physician recommends that you continue on your current medications as directed. Please refer to the Current Medication list given to you today.  ?*If you need a refill on your cardiac medications before your next appointment, please call your pharmacy* ? ? ?Lab Work: ?None ?If you have labs (blood work) drawn today and your tests are completely normal, you will receive your results only by: ?MyChart Message (if you have MyChart) OR ?A paper copy in the mail ?If you have any lab test that is abnormal or we need to change your treatment, we will call you to review the results. ? ? ?Testing/Procedures: ?None ? ? ?Follow-Up: ?At [facility], you and your health needs are our priority.  As part of our continuing mission to provide you with exceptional heart care, we have created designated Provider Care Teams.  These Care Teams include your primary Cardiologist (physician) and Advanced Practice Providers (APPs -  Physician Assistants and Nurse Practitioners) who all work together to provide you with the care you need, when you need it. ? ?We recommend signing up for the patient portal called "MyChart".  Sign up information is provided on this After Visit Summary.  MyChart is used to connect with patients for Virtual Visits (Telemedicine).  Patients are able to view lab/test results, encounter notes, upcoming appointments, etc.  Non-urgent messages can be sent to your provider as well.   ?To learn more about what you can do with MyChart, go to [email or web address].   ? ?Your next appointment:   ?1 year(s) ? ?The format for your next appointment:   ?In Person ? ?Provider:   ?Jonathan Berry, MD   ? ? ?Other Instructions ? ? ?Important Information About Sugar ? ? ? ? ?  ?

## 2022-01-09 NOTE — Assessment & Plan Note (Signed)
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 Link Snuffer complicated  by distal emboli requiring further intracranial intervention.  He still has residual neurologic deficits.  He remains on dual antiplatelet therapy including aspirin and Brilinta.

## 2022-01-09 NOTE — Assessment & Plan Note (Signed)
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  No further recurrences.

## 2022-01-09 NOTE — Progress Notes (Signed)
01/09/2022 Stephen Hull   11/24/1945  [id number]  Primary Physician Stephen Hull., PA-C Primary Cardiologist: Runell Gess MD Stephen Hull, [place]  HPI:  Stephen Hull is a 76 y.o.  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 He was referred by Stephen Stabs PA-C for a symptomatically bradycardia.  I last saw him in the office 07/26/2019.  I originally saw him in 1991 for PSVT. At that time I performed cardiac catheterization which was apparently normal and placed him on Sectral, beta blocker. I had not seen him back since until 07/23/2016.  He does have a history of hypertension. He quit smoking in 1991 when I suggested that. He's lost 35 pounds in the last 6 months because of back problems. He denies chest pain or shortness of breath. He was noted to have a symptomatic bradycardia with a heart rate of 36 during a recent office visit with his PCP.   Since I saw him 2-1/2 years ago he is remained stable cardiovascularly.  He did see Stephen Skiff, PA-C in the office in November with neurologic symptoms.  A carotid Doppler was ordered at that time however the patient developed a stroke prior to the carotid Doppler study.  He presented in February with stroke type symptoms and underwent neurologic evaluation and imaging.  He had a subtotally occluded right internal carotid artery and ultimately underwent PTA and stenting by Dr. Kirkland Hull .  He was having morning hypotension and was taken off his Sectral.  I reviewed his blood pressure readings at home and they appear to be within the normal range.  He denies chest pain or shortness of breath.   Current Meds  Medication Sig   acetaminophen (TYLENOL) 325 MG tablet Take 650 mg by mouth every 6 (six) hours as needed for headache.   aspirin 81 MG chewable tablet Chew 1 tablet (81 mg total) by mouth daily.   atorvastatin (LIPITOR) 80  MG tablet Take 1 tablet (80 mg total) by mouth daily.   Cholecalciferol (VITAMIN D3) 2000 units TABS Take 2,000 Units by mouth daily.   losartan (COZAAR) 50 MG tablet Take 1 tablet by mouth daily.   omeprazole (PRILOSEC) 40 MG capsule Take 1 capsule by mouth daily.   sertraline (ZOLOFT) 50 MG tablet Take 50 mg by mouth daily.   tamsulosin (FLOMAX) 0.4 MG CAPS capsule Take 0.4 mg by mouth daily.   ticagrelor (BRILINTA) 90 MG TABS tablet Take 1 tablet (90 mg total) by mouth 2 (two) times daily.   traZODone (DESYREL) 50 MG tablet Take 25-50 mg by mouth at bedtime as needed for sleep.   [DISCONTINUED] acebutolol (SECTRAL) 200 MG capsule Take 1 capsule (200 mg total) by mouth daily.     Allergies  Allergen Reactions   Cashew Nut (Anacardium Occidentale) Skin Test Other (See Comments)    Makes him sick Makes him sick     Social History   Socioeconomic History   Marital status: Married    Spouse name: Stephen Hull   Number of children: Not on file   Years of education: Not on file   Highest education level: Not on file  Occupational History   Not on file  Tobacco Use   Smoking status: Former    Types: Cigarettes    Quit date: 07/21/1989    Years since quitting: 32.4   Smokeless tobacco: Former    Quit date:  07/21/1989  Vaping Use   Vaping Use: Never used  Substance and Sexual Activity   Alcohol use: No   Drug use: No   Sexual activity: Not on file  Other Topics Concern   Not on file  Social History Narrative   Not on file   Social Determinants of Health   Financial Resource Strain: Low Risk  (07/05/2021)   Overall Financial Resource Strain (CARDIA)    Difficulty of Paying Living Expenses: Not hard at all  Food Insecurity: No Food Insecurity (07/05/2021)   Hunger Vital Sign    Worried About Running Out of Food in the Last Year: Never true    Ran Out of Food in the Last Year: Never true  Transportation Needs: No Transportation Needs (07/05/2021)   PRAPARE - Therapist, art (Medical): No    Lack of Transportation (Non-Medical): No  Physical Activity: Not on file  Stress: No Stress Concern Present (07/05/2021)   [employer] of Occupational Health - Occupational Stress Questionnaire    Feeling of Stress : Only a little  Social Connections: Unknown (07/05/2021)   Social Connection and Isolation Panel [NHANES]    Frequency of Communication with Friends and Family: More than three times a week    Frequency of Social Gatherings with Friends and Family: More than three times a week    Attends Religious Services: Not on file    Active Member of Clubs or Organizations: Yes    Attends Banker Meetings: More than 4 times per year    Marital Status: Married  Catering manager Violence: Not At Risk (07/05/2021)   Humiliation, Afraid, Rape, and Kick questionnaire    Fear of Current or Ex-Partner: No    Emotionally Abused: No    Physically Abused: No    Sexually Abused: No     Review of Systems: General: negative for chills, fever, night sweats or weight changes.  Cardiovascular: negative for chest pain, dyspnea on exertion, edema, orthopnea, palpitations, paroxysmal nocturnal dyspnea or shortness of breath Dermatological: negative for rash Respiratory: negative for cough or wheezing Urologic: negative for hematuria Abdominal: negative for nausea, vomiting, diarrhea, bright red blood per rectum, melena, or hematemesis Neurologic: negative for visual changes, syncope, or dizziness All other systems reviewed and are otherwise negative except as noted above.    Blood pressure 130/90, pulse (!) 102, height 5\' 11"  (1.803 m), weight 250 lb (113.4 kg).  General appearance: alert and no distress Neck: no adenopathy, no carotid bruit, no JVD, supple, symmetrical, trachea midline, and thyroid not enlarged, symmetric, no tenderness/mass/nodules Lungs: clear to auscultation bilaterally Heart: regular rate and rhythm, S1, S2 normal, no  murmur, click, rub or gallop Extremities: extremities normal, atraumatic, no cyanosis or edema Pulses: 2+ and symmetric Skin: Skin color, texture, turgor normal. No rashes or lesions Neurologic: Grossly normal  EKG not performed today  ASSESSMENT AND PLAN:   Hypertension History of essential hypertension blood pressure measured today at 130/90.  He is on losartan.  I reviewed his blood pressure readings at home which look like they for the most part are all in the normal range.  He was taken off Sectral all because of relative hypotension and  PSVT (paroxysmal supraventricular tachycardia) (HCC) History of PSVT in the past previously on Sectral.  No further recurrences.  Mixed hyperlipidemia History of hyperlipidemia on high-dose atorvastatin with lipid profile performed 09/03/2021 revealing total cholesterol 104, LDL 43 and HDL 38.  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 Link Snuffer complicated  by distal emboli requiring further intracranial intervention.  He still has residual neurologic deficits.  He remains on dual antiplatelet therapy including aspirin and Brilinta.     Runell Gess MD FACP,FACC,FAHA, [facility] 01/09/2022 8:37 AM

## 2022-01-22 ENCOUNTER — Ambulatory Visit (INDEPENDENT_AMBULATORY_CARE_PROVIDER_SITE_OTHER): Payer: Medicare HMO | Admitting: Neurology

## 2022-01-22 DIAGNOSIS — Z95828 Presence of other vascular implants and grafts: Secondary | ICD-10-CM

## 2022-01-22 DIAGNOSIS — G4731 Primary central sleep apnea: Secondary | ICD-10-CM

## 2022-01-22 DIAGNOSIS — G4733 Obstructive sleep apnea (adult) (pediatric): Secondary | ICD-10-CM

## 2022-01-22 DIAGNOSIS — E669 Obesity, unspecified: Secondary | ICD-10-CM

## 2022-01-22 DIAGNOSIS — G4719 Other hypersomnia: Secondary | ICD-10-CM

## 2022-01-22 DIAGNOSIS — Z820 Family history of epilepsy and other diseases of the nervous system: Secondary | ICD-10-CM

## 2022-01-22 DIAGNOSIS — R351 Nocturia: Secondary | ICD-10-CM

## 2022-01-22 DIAGNOSIS — Z8673 Personal history of transient ischemic attack (TIA), and cerebral infarction without residual deficits: Secondary | ICD-10-CM

## 2022-01-22 DIAGNOSIS — R0683 Snoring: Secondary | ICD-10-CM

## 2022-01-23 NOTE — Addendum Note (Signed)
Addended by: Huston Foley on: 01/23/2022 06:52 PM   Modules accepted: Orders

## 2022-01-23 NOTE — Progress Notes (Signed)
See procedure note.

## 2022-01-23 NOTE — Procedures (Signed)
[facility]  HOME SLEEP TEST (Watch PAT) REPORT  STUDY DATE: 01/22/2022  DOB: 1945/09/29  MRN: [id number]  ORDERING CLINICIAN: Huston Foley, MD, PhD   REFERRING CLINICIAN: Dr. Pearlean Brownie  CLINICAL INFORMATION/HISTORY: 76 year old right-handed gentleman with an underlying medical history of bradycardia, smoking, hypertension, PSVT, stroke in November 2022 with status post right ICA stenting and status post right MCA thrombectomy, SAH, and obesity, who reports snoring as well as witnessed apneas.  Epworth sleepiness score: 4/24.  BMI: 33.3 kg/m  FINDINGS:   Sleep Summary:   Total Recording Time (hours, min): 10 hours, 3 min  Total Sleep Time (hours, min):  9 hours, 10 min  Percent REM (%):    9.7%   Respiratory Indices:   Calculated pAHI (per hour):  67.7/hour         REM pAHI:    49.8/hour       NREM pAHI: 69.7/hour  Central pAHI: 26.3/hour  Oxygen Saturation Statistics:    Oxygen Saturation (%) Mean: 93%   Minimum oxygen saturation (%):                 83%   O2 Saturation Range (%): 83-99%    O2 Saturation (minutes) <=88%: 4.1 min  Pulse Rate Statistics:   Pulse Mean (bpm):    71/min    Pulse Range (48-128/min)   IMPRESSION: OSA (obstructive sleep apnea), severe Central Sleep Apnea  RECOMMENDATION:  This home sleep test demonstrates severe obstructive and central sleep disordered breathing with an overall AHI of 67.7/h, central AHI estimated around 26.3/h, nearly 50% of the events being central in nature per home sleep test equipment.  Average oxygen saturation was 93%, nadir was 83%.  Snoring was detected in the moderate to loud range throughout the night fairly consistently.  Treatment with positive airway pressure is highly recommended.  I will recommend a laboratory attended titration study given the nearly 50% central respiratory events/mixed events.  This home sleep test indicates underlying obstructive and central sleep apnea.  The  patient may benefit from CPAP therapy but may need more sophisticated treatment such as BiPAP, or BiPAP ST.  Therefore, a full night PAP titration study for proper treatment modality and settings, O2 monitoring and mask fitting will be requested. Alternative treatment options are limited secondary to the severity of the patient's sleep disordered breathing, and may be restricted due to central respiratory events. Please note, that untreated obstructive sleep apnea may carry additional perioperative morbidity. Patients with significant obstructive sleep apnea should receive perioperative PAP therapy and the surgeons and particularly the anesthesiologist should be informed of the diagnosis and the severity of the sleep disordered breathing. The patient should be cautioned not to drive, work at heights, or operate dangerous or heavy equipment when tired or sleepy. Review and reiteration of good sleep hygiene measures should be pursued with any patient. Other causes of the patient's symptoms, including circadian rhythm disturbances, an underlying mood disorder, medication effect and/or an underlying medical problem cannot be ruled out based on this test. Clinical correlation is recommended. The patient and his referring provider will be notified of the test results. The patient will be seen in follow up in sleep clinic at [facility].  I certify that I have reviewed the raw data recording prior to the issuance of this report in accordance with the standards of the American Academy of Sleep Medicine (AASM).  INTERPRETING PHYSICIAN:   Huston Foley, MD, PhD  Board Certified in Neurology and Sleep Medicine  [facility] [address] [place], [place] [address] [phone]

## 2022-01-28 ENCOUNTER — Telehealth: Payer: Self-pay

## 2022-01-28 NOTE — Telephone Encounter (Signed)
I called pt. I advised pt that Dr. Frances Furbish reviewed their sleep study results and found that sleep study show severe apnea with a mix between central and obstructive and recommends that pt be treated with a cpap. Dr. Frances Furbish recommends that pt return for a repeat sleep study in order to properly titrate the cpap and ensure a good mask fit. Pt is agreeable to returning for a titration study. I advised pt that our sleep lab will file with pt's insurance and call pt to schedule the sleep study when we hear back from the pt's insurance regarding coverage of this sleep study. Pt verbalized understanding of results. Pt had no questions at this time but was encouraged to call back if questions arise.

## 2022-01-28 NOTE — Telephone Encounter (Signed)
-----   Message from Huston Foley, MD sent at 01/23/2022  6:52 PM EDT ----- Patient referred by Dr. Pearlean Brownie, seen by me on 11/07/21, HST on 01/22/22:  Please call and notify the patient that the recent home sleep test showed severe sleep apnea including obstructive sleep apnea but also central apneas which are generally often treated with a different machine rather than CPAP alone.  I recommend that we try to bring him in for a proper sleep study in the sleep lab so we can try CPAP therapy but also escalate treatment to a more sophisticated machine such as BiPAP if need be.  I would like to order a titration study and we can submit to insurance authorization.  Please inform patient and/or his wife.    Huston Foley, MD, PhD [facility])

## 2022-01-29 ENCOUNTER — Telehealth: Payer: Self-pay | Admitting: Neurology

## 2022-01-29 NOTE — Telephone Encounter (Signed)
LVM for pt to call back to schedule  Ethlyn Gallery: [id number] (exp. 01/28/22 to 02/27/22)

## 2022-01-29 NOTE — Telephone Encounter (Signed)
CPAP Titration- [employer] Harvey: [id number] (exp. 01/28/22 to 02/27/22).  Patient is scheduled at [facility] for 02/20/22 at 9 pm. I also mailed packet to the patient.

## 2022-02-04 ENCOUNTER — Other Ambulatory Visit (HOSPITAL_COMMUNITY): Payer: Self-pay | Admitting: Interventional Radiology

## 2022-02-04 ENCOUNTER — Telehealth (HOSPITAL_COMMUNITY): Payer: Self-pay

## 2022-02-04 DIAGNOSIS — I771 Stricture of artery: Secondary | ICD-10-CM

## 2022-02-04 NOTE — Telephone Encounter (Signed)
Called to schedule us carotid, no answer, left vm. AW  

## 2022-02-20 ENCOUNTER — Ambulatory Visit (INDEPENDENT_AMBULATORY_CARE_PROVIDER_SITE_OTHER): Payer: Medicare HMO | Admitting: Neurology

## 2022-02-20 DIAGNOSIS — E66811 Obesity, class 1: Secondary | ICD-10-CM

## 2022-02-20 DIAGNOSIS — G4719 Other hypersomnia: Secondary | ICD-10-CM

## 2022-02-20 DIAGNOSIS — G4731 Primary central sleep apnea: Secondary | ICD-10-CM

## 2022-02-20 DIAGNOSIS — E669 Obesity, unspecified: Secondary | ICD-10-CM

## 2022-02-20 DIAGNOSIS — G4761 Periodic limb movement disorder: Secondary | ICD-10-CM

## 2022-02-20 DIAGNOSIS — Z95828 Presence of other vascular implants and grafts: Secondary | ICD-10-CM

## 2022-02-20 DIAGNOSIS — G4733 Obstructive sleep apnea (adult) (pediatric): Secondary | ICD-10-CM

## 2022-02-20 DIAGNOSIS — R351 Nocturia: Secondary | ICD-10-CM

## 2022-02-20 DIAGNOSIS — Z8673 Personal history of transient ischemic attack (TIA), and cerebral infarction without residual deficits: Secondary | ICD-10-CM

## 2022-02-20 DIAGNOSIS — Z820 Family history of epilepsy and other diseases of the nervous system: Secondary | ICD-10-CM

## 2022-02-26 ENCOUNTER — Ambulatory Visit (HOSPITAL_COMMUNITY)
Admission: RE | Admit: 2022-02-26 | Discharge: 2022-02-26 | Disposition: A | Discharge status: Home / Self Care | Payer: Medicare HMO | Source: Ambulatory Visit | Attending: Interventional Radiology | Admitting: Interventional Radiology

## 2022-02-26 ENCOUNTER — Telehealth (HOSPITAL_COMMUNITY): Payer: Self-pay

## 2022-02-26 DIAGNOSIS — I771 Stricture of artery: Secondary | ICD-10-CM | POA: Insufficient documentation

## 2022-02-26 NOTE — Progress Notes (Signed)
Carotid artery duplex has been completed. Preliminary results can be found in CV Proc through chart review.   02/26/22 10:16 AM Olen Cordial RVT

## 2022-02-26 NOTE — Telephone Encounter (Signed)
Called to inform pt that Dr. Corliss Skains would like him to f/u in 6 months with another US carotid. He wanted to know if his left side had gotten better or worse. I have sent a message to our PA to advise. AW

## 2022-03-04 NOTE — Procedures (Signed)
[facility] PAP TITRATION INTERPRETATION REPORT   STUDY DATE: 02/20/2022      PATIENT NAME:  Stephen Hull, Stephen Hull         DATE OF BIRTH:  22-Mar-1946  PATIENT ID:  [id number]    TYPE OF STUDY:  CPAP  READING PHYSICIAN: Huston Foley, MD, PhD SCORING TECHNICIAN: Domingo Cocking   INDICATIONS:  76 year old right-handed gentleman with an underlying medical history of bradycardia, smoking, hypertension, PSVT, stroke in November 2022 with status post right ICA stenting and status post right MCA thrombectomy, SAH, and obesity, who presents for a full night titration study. His home sleep test from 01/22/22 showed: AHI of 67.7/h, central AHI estimated around 26.3/h, nearly 50% of the events being central in nature per home sleep test equipment. ?Average oxygen saturation was 93%, nadir was 83%. ?The Epworth Sleepiness Scale was 4 out of 24 (scores above or equal to 10 are suggestive of hypersomnolence).  DESCRIPTION: A sleep technologist was in attendance for the duration of the recording.  Data collection, scoring, video monitoring, and reporting were performed in compliance with the AASM Manual for the Scoring of Sleep and Associated Events; (Hypopnea is scored based on the criteria listed in Section VIII D. 1b in the AASM Manual V2.6 using a 4% oxygen desaturation rule or Hypopnea is scored based on the criteria listed in Section VIII D. 1a in the AASM Manual V2.6 using 3% oxygen desaturation and /or arousal rule).  A physician certified by the American Board of Sleep Medicine reviewed each epoch of the study.  ADDITIONAL INFORMATION:  Height: 71.0 in Weight: 238 lb (BMI 33) Neck Size: 19.0 in Medications: Sectral, Tylenol, Aspirin, Lipitor, Vitamin D3, Cozaar, Prilosec, Zoloft, Flomax, Brilinta, Desyerel  FINDINGS:  Please refer to the attached summary for additional quantitative information. The patient was fitted with a large Eson 2 nasal mask after exploring different  interface options.   SLEEP CONTINUITY AND SLEEP ARCHITECTURE:  Lights off was at 21:27: and lights on 05:00: (7.5 hours in bed). Total sleep time was 316.0 minutes (39.6% supine;  60.4% lateral;  0.0% prone, 11.9% REM sleep), with a decreased sleep efficiency at 69.8%. Sleep latency was normal at 23.5 minutes.  REM sleep latency was increased at 360.5 minutes. Of the total sleep time, the percentage of stage N1 sleep was 10.1%, stage N2 sleep was 65.5%, stage N3 sleep was 12.5%, and REM sleep was 11.9%. There were 2 Stage R periods observed on this study night, 30 awakenings (i.e. transitions to Stage W from any sleep stage), and 95.0 total stage transitions. Wake after sleep onset (WASO) time accounted for 113 minutes with .  AROUSAL: There were 54 arousals in total, for an arousal index of 10.3 arousals/hour.  Of these, 9 were identified as respiratory-related arousals (1.7 /hr), 8 were PLM-related arousals (1.5 /hr), and 55 were non-specific arousals (10.4 /hr)  RESPIRATORY MONITORING:  Based on CMS criteria (using a 4% oxygen desaturation rule for scoring hypopneas), there were 5 apneas (2 obstructive; 0 central; 3 mixed), and 13 hypopneas.  Apnea index was 0.9. Hypopnea index was 2.5. The apnea-hypopnea index was 3.4 overall (8.6 supine, 0.0 non-supine; 0.0 REM, 0.0 supine REM). There were 0 respiratory effort-related arousals (RERAs).  The RERA index was 0.0 events/hr. Total respiratory disturbance index (RDI) was 3.4 events/hr. RDI results showed: supine RDI  8.6 /hr; non-supine RDI 0.0 /hr; REM RDI 0.0 /hr, supine REM RDI 0.0 /hr.   Based on AASM criteria (using a 3%  oxygen desaturation and /or arousal rule for scoring hypopneas), there were 5 apneas (2 obstructive; 0 central; 3 mixed), and 16 hypopneas. Apnea index was 0.9. Hypopnea index was 3.0. The apnea-hypopnea index was 4.0 overall (10.1 supine, 0.0 non-supine; 0.0 REM, 0.0 supine REM). There were 0 respiratory effort-related arousals (RERAs).   The RERA index was 0.0 events/hr. Total respiratory disturbance index (RDI) was 4.0 events/hr. RDI results showed: supine RDI  10.1 /hr; non-supine RDI 0.0 /hr; REM RDI 0.0 /hr, supine REM RDI 0.0 /hr.  Respiratory events were associated with oxyhemoglobin desaturations (nadir during sleep 70%) from a mean of 95%). There were 0 occurrences of Cheyne Stokes breathing. LIMB MOVEMENTS: There were 267 periodic limb movements of sleep (50.7/hr), of which 8 (1.5/hr) were associated with an arousal. OXIMETRY: Total sleep time spent at, or below 88% was 0.3 minutes, or 0.1% of total sleep time. Snoring was classified as mild to moderate, but improved during the titration. BODY POSITION: Duration of total sleep and percent of total sleep in their respective position is as follows: supine 125 minutes (39.6%), non-supine 191.0 minutes (60.4%); right 190 minutes (60.3%), left 00 minutes (0.2%), and prone 00 minutes (0.0%). Total supine REM sleep time was 00 minutes (0.0% of total REM sleep). Post-study, the patient indicated that his sleep was the same as usual  Recommended Settings:  8 cm, via Lg Eson 2 nasal mask.  EEG: With the limited montage recorded, no EEG abnormalities were observed.   CARDIAC: The electrocardiogram documented EKG.  The average heart rate during sleep was 79 bpm.  The maximum heart rate during sleep was 98 bpm. The maximum heart rate during recording was 109.    BEHAVIORAL: No significant parasomnia behavior noted.   IMPRESSION and RECOMMENDATIONS:   1. This study demonstrates resolution of the patient's obstructive sleep apnea with CPAP therapy, AHI was 0.3/hour on a pressure of 8 cm, with non-supine REM sleep achieved, O2 nadir of 95%. No significant central apneas were noted. I recommend a home CPAP treatment at a pressure of 8 cm via large nasal mask with heated humidity. The patient will be advised to be fully compliant with PAP therapy to improve sleep related symptoms and  decrease long term cardiovascular risks. The patient should be reminded, that it may take up to 3 months to get fully used to using PAP with all planned sleep. The earlier full compliance is achieved, the better long term compliance tends to be. Please note that untreated obstructive sleep apnea may carry additional perioperative morbidity. Patients with significant obstructive sleep apnea should receive perioperative PAP therapy and the surgeons and particularly the anesthesiologist should be informed of the diagnosis and the severity of the sleep disordered breathing. 2. 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 Medication effect from the antidepressant medication should be considered.  3. The patient should be cautioned not to drive, work at heights, or operate dangerous or heavy equipment when tired or sleepy. Review and reiteration of good sleep hygiene measures should be pursued with any patient. 4. The patient will be seen in follow-up in the sleep clinic at [facility] for discussion of the test results, symptom and treatment compliance review, further management strategies, etc. The referring provider will be notified of the test results.  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

## 2022-03-04 NOTE — Addendum Note (Signed)
Addended by: Huston Foley on: 03/04/2022 05:22 PM   Modules accepted: Orders

## 2022-03-05 ENCOUNTER — Telehealth: Payer: Self-pay | Admitting: *Deleted

## 2022-03-05 ENCOUNTER — Encounter: Payer: Self-pay | Admitting: *Deleted

## 2022-03-05 NOTE — Telephone Encounter (Signed)
As per below, I gave him the results of the cpap titration study. He will get thru [place].  He will call and see what they need from [place] about getting machine and then will proceed from there.  He will need 10 wk f/u appt. (Need to give info about 4 hour each night, insurance compliance.  Bring machine with him to appt).  He will call us back.

## 2022-03-05 NOTE — Telephone Encounter (Addendum)
-----   Message from Huston Foley, MD sent at 03/04/2022  5:22 PM EDT ----- Patient had a CPAP titration study on 02/20/22.  Please call and inform patient that I have entered an order for treatment with positive airway pressure (PAP) treatment for obstructive sleep apnea (OSA). He did well during the latest sleep study with CPAP. We will, therefore, arrange for a machine for home use through a DME (durable medical equipment) company of His choice; and I will see the patient back in follow-up in about 10 weeks. Please also explain to the patient that I will be looking out for compliance data, which can be downloaded from the machine (stored on an SD card, that is inserted in the machine) or via remote access through a modem, that is built into the machine. At the time of the followup appointment we will discuss sleep study results and how it is going with PAP treatment at home. Please advise patient to bring His machine at the time of the first FU visit, even though this is cumbersome. Bringing the machine for every visit after that will likely not be needed, but often helps for the first visit to troubleshoot if needed. Please re-enforce the importance of compliance with treatment and the need for [place] to monitor compliance data - often an insurance requirement and actually good feedback for the patient as far as how they are doing.  Also remind patient, that any interim PAP machine or mask issues should be first addressed with the DME company, as they can often help better with technical and mask fit issues. Please ask if patient has a preference regarding DME company.  Please also make sure, the patient has a follow-up appointment with me in about 10 weeks from the setup date, thanks. May see one of our nurse practitioners if needed for proper timing of the FU appointment.  Please fax or rout report to the referring provider. Thanks,   Huston Foley, MD, PhD [facility])   I relayed  results as above to pt.  He verbalized understanding. Needs order and results faxed to [facility] Dr. Moshe Cipro at [phone], phone [phone].  Fax confirmation received. I will send him a letter via mychart.

## 2022-03-05 NOTE — Telephone Encounter (Signed)
-----   Message from Huston Foley, MD sent at 03/04/2022  5:22 PM EDT ----- Patient had a CPAP titration study on 02/20/22.  Please call and inform patient that I have entered an order for treatment with positive airway pressure (PAP) treatment for obstructive sleep apnea (OSA). He did well during the latest sleep study with CPAP. We will, therefore, arrange for a machine for home use through a DME (durable medical equipment) company of His choice; and I will see the patient back in follow-up in about 10 weeks. Please also explain to the patient that I will be looking out for compliance data, which can be downloaded from the machine (stored on an SD card, that is inserted in the machine) or via remote access through a modem, that is built into the machine. At the time of the followup appointment we will discuss sleep study results and how it is going with PAP treatment at home. Please advise patient to bring His machine at the time of the first FU visit, even though this is cumbersome. Bringing the machine for every visit after that will likely not be needed, but often helps for the first visit to troubleshoot if needed. Please re-enforce the importance of compliance with treatment and the need for [place] to monitor compliance data - often an insurance requirement and actually good feedback for the patient as far as how they are doing.  Also remind patient, that any interim PAP machine or mask issues should be first addressed with the DME company, as they can often help better with technical and mask fit issues. Please ask if patient has a preference regarding DME company.  Please also make sure, the patient has a follow-up appointment with me in about 10 weeks from the setup date, thanks. May see one of our nurse practitioners if needed for proper timing of the FU appointment.  Please fax or rout report to the referring provider. Thanks,   Huston Foley, MD, PhD [facility])

## 2022-03-05 NOTE — Telephone Encounter (Signed)
Pt called back and ask if a nurse would call him back. Pt said he have some more questions.

## 2022-03-17 NOTE — Telephone Encounter (Signed)
Pt is asking for a call back from [place], [place] re: his CPAP

## 2022-03-17 NOTE — Telephone Encounter (Signed)
I called pt back.  [facility] not [facility] to [facility] if they did not refer pt to [place].  [facility] since [facility] lives in [place], [place].  I explained procedure.  Will keep the appt listed for initial cpap f/u.  Pt verbalized understanding of plan.  He was given the # Ella Jubilee [phone], fax [phone].

## 2022-03-18 ENCOUNTER — Telehealth: Payer: Self-pay | Admitting: Neurology

## 2022-03-18 NOTE — Telephone Encounter (Signed)
I called [name] and [facility] do not take [facility].  I called pt and he was able to to speak to Dr. Ina Homes office at the [facility] and was told that he can get a CPAP from them, should not be a problem.  He will come by or be mailed copy of information (sleep study, ofv note and order for cpap).  I told will let our MR know, he may need to signe release. This is copy for himself. He appreciated call back.

## 2022-03-18 NOTE — Telephone Encounter (Signed)
Pt said do not have CPAP machine ,[facility] informed me do not take [facility]. [facility] ,appt set up December 14 at 11:40 am. Would like a call from the nurse.

## 2022-03-18 NOTE — Telephone Encounter (Signed)
Received confirmation [facility] by fax that received for new cpap for pt. [id number].

## 2022-03-19 NOTE — Telephone Encounter (Signed)
Medical records mailed to pt

## 2022-04-21 ENCOUNTER — Telehealth (HOSPITAL_COMMUNITY): Payer: Self-pay | Admitting: Physician Assistant

## 2022-04-21 NOTE — Progress Notes (Signed)
Patient ID: Stephen Hull, male   DOB: 06/11/1946, 76 y.o.   MRN: [id number]  Received a blank [place] refill request form via fax.  Called patient to confirm need of prescription refill.  He reports having a 5 mo supply of Brilinta and not needing any refills at this time.  He is aware of 68mo f/u imaging in February and recommendation made to align refill with this follow up.  Patient agreed this was reasonable.  Will hold off on sending in refills at this time.  Electronically Signed: Pasty Spillers, PA-C 04/21/2022, 3:41 PM

## 2022-05-15 ENCOUNTER — Encounter: Payer: Medicare HMO | Admitting: Adult Health

## 2022-12-24 ENCOUNTER — Other Ambulatory Visit (HOSPITAL_COMMUNITY): Payer: Self-pay | Admitting: Interventional Radiology

## 2022-12-24 DIAGNOSIS — I771 Stricture of artery: Secondary | ICD-10-CM

## 2023-01-07 ENCOUNTER — Ambulatory Visit (HOSPITAL_COMMUNITY)
Admission: RE | Admit: 2023-01-07 | Discharge: 2023-01-07 | Disposition: A | Discharge status: Home / Self Care | Payer: Medicare HMO | Source: Ambulatory Visit | Attending: Interventional Radiology | Admitting: Interventional Radiology

## 2023-01-07 DIAGNOSIS — I771 Stricture of artery: Secondary | ICD-10-CM | POA: Insufficient documentation

## 2023-01-19 ENCOUNTER — Telehealth (HOSPITAL_COMMUNITY): Payer: Self-pay

## 2023-01-19 NOTE — Telephone Encounter (Signed)
Pt agreed to f/u in 6 months with a US carotid. He wants to know if he should continue his Brilinta or not. I have sent a message to our PA to advise. AB

## 2023-01-20 ENCOUNTER — Telehealth (HOSPITAL_COMMUNITY): Payer: Self-pay

## 2023-01-20 NOTE — Telephone Encounter (Signed)
-----   Message from Villa Herb, PA-C sent at 01/19/2023  4:00 PM EDT ----- He can d/c but just like the other one I don't have time to call him right now so I will call him tomorrow :)  Thanks, Carollee Herter ----- Message ----- From: Sharee Pimple Sent: 01/19/2023   8:55 AM EDT To: Villa Herb, PA-C  Shannon,   Pt agreed to f/u in 6 months with an ultrasound. He wants to know if he should continue his Brilinta? If he can d/c, can you give him a call? If he is to continue, will you call in a refill bc he says he is almost out?  Thanks,  Fara Boros  ----- Message ----- From: Kennieth Francois, [place] Sent: 01/16/2023  11:26 AM EDT To: Shirlyn Goltz; Sharee Pimple  Good morning, Dr Corliss Skains would like for this patient to have a Carotid US in 6 months. Thank you! -[facility]

## 2023-01-20 NOTE — Telephone Encounter (Signed)
Pt is aware that he can d/c his Brilinta per Dr. Corliss Skains. I will call him in 6 months to schedule his ultrasound. AB

## 2023-09-07 ENCOUNTER — Ambulatory Visit: Payer: Medicare HMO | Admitting: Cardiovascular Disease

## 2023-10-02 ENCOUNTER — Ambulatory Visit: Payer: Medicare HMO | Attending: Cardiovascular Disease | Admitting: Cardiovascular Disease

## 2023-10-02 ENCOUNTER — Encounter: Payer: Self-pay | Admitting: Cardiovascular Disease

## 2023-10-02 VITALS — BP 122/68 | HR 101 | Ht 71.0 in | Wt 259.0 lb

## 2023-10-02 DIAGNOSIS — E782 Mixed hyperlipidemia: Secondary | ICD-10-CM | POA: Diagnosis not present

## 2023-10-02 DIAGNOSIS — I6522 Occlusion and stenosis of left carotid artery: Secondary | ICD-10-CM | POA: Diagnosis not present

## 2023-10-02 DIAGNOSIS — I10 Essential (primary) hypertension: Secondary | ICD-10-CM

## 2023-10-02 DIAGNOSIS — R001 Bradycardia, unspecified: Secondary | ICD-10-CM

## 2023-10-02 DIAGNOSIS — G4733 Obstructive sleep apnea (adult) (pediatric): Secondary | ICD-10-CM | POA: Insufficient documentation

## 2023-10-02 MED ORDER — LOSARTAN POTASSIUM 25 MG PO TABS: 25.0000 mg | ORAL_TABLET | Freq: Every day | ORAL | 3 refills | Status: DC

## 2023-10-02 NOTE — Assessment & Plan Note (Signed)
 History of essential hypertension with blood pressure measured today at 122/68.  He is on losartan 50 mg a day.  His blood pressure at home is in the same range although occasionally he is somewhat more hypotensive.  He does complain of fatigue.  I am going to decrease his losartan from 50 to 25 mg a day and have him keep a 30 blood pressure log.  He will come back in after that to have this reviewed make further changes.

## 2023-10-02 NOTE — Assessment & Plan Note (Signed)
 History of hyperlipidemia on high-dose statin therapy with lipid profile performed by his PCP 1 month ago revealing a total cholesterol of 121, LDL 55 and HDL 42.

## 2023-10-02 NOTE — Assessment & Plan Note (Signed)
 History of stroke with right carotid stenting by Dr. Titus Dubin in 2022 with recent Doppler studies performed in June of last year revealing a patent stent with moderate left ICA stenosis.

## 2023-10-02 NOTE — Progress Notes (Signed)
 10/02/2023 [name]   26-Aug-1945  [id number]  Primary Physician Runell Gess, MD Primary Cardiologist: Runell Gess MD Nicholes Calamity, [place]  HPI:  Stephen Hull is a 78 y.o.  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 He was referred by [name] PA-C for a symptomatically bradycardia.  I last saw him in the office 01/09/2022.  I originally saw him in 1991 for PSVT. At that time I performed cardiac catheterization which was apparently normal and placed him on Sectral, beta blocker. I had not seen him back since until 07/23/2016.  He does have a history of hypertension. He quit smoking in 1991 when I suggested that. He's lost 35 pounds in the last 6 months because of back problems. He denies chest pain or shortness of breath. He was noted to have a symptomatic bradycardia with a heart rate of 36 during a recent office visit with his PCP.   Since I saw him in the office a year and a half ago he is remained stable.  He did have a right internal carotid stent by Dr. Sonny Masters for the after stroke.  He has been following his carotid Dopplers since was last checked June 24 revealing a patent right ICA stent.  His major complaint is of fatigue.  He does have some dyspnea on exertion.  He had a recent chest x-ray that showed a widened mediastinum.  He denies chest pain.  He does have obstructive sleep apnea on CPAP.   Current Meds  Medication Sig   acetaminophen (TYLENOL) 325 MG tablet Take 650 mg by mouth every 6 (six) hours as needed for headache.   atorvastatin (LIPITOR) 80 MG tablet Take 1 tablet (80 mg total) by mouth daily.   Cholecalciferol (VITAMIN D3) 2000 units TABS Take 2,000 Units by mouth daily.   doxazosin (CARDURA) 1 MG tablet Take by mouth.   losartan (COZAAR) 50 MG tablet Take 1 tablet by mouth daily.   pantoprazole (PROTONIX) 40 MG tablet Take by mouth.    sertraline (ZOLOFT) 50 MG tablet Take 50 mg by mouth daily.     Allergies  Allergen Reactions   Cashew Nut (Anacardium Occidentale) Skin Test Other (See Comments)    Makes him sick Makes him sick     Social History   Socioeconomic History   Marital status: Married    Spouse name: Bethena Midget   Number of children: Not on file   Years of education: Not on file   Highest education level: Not on file  Occupational History   Not on file  Tobacco Use   Smoking status: Former    Current packs/day: 0.00    Types: Cigarettes    Quit date: 07/21/1989    Years since quitting: 34.2   Smokeless tobacco: Former    Quit date: 07/21/1989  Vaping Use   Vaping status: Never Used  Substance and Sexual Activity   Alcohol use: No   Drug use: No   Sexual activity: Not on file  Other Topics Concern   Not on file  Social History Narrative   Not on file   Social Drivers of Health   Financial Resource Strain: Low Risk  (07/05/2021)   Overall Financial Resource Strain (CARDIA)    Difficulty of Paying Living Expenses: Not hard at all  Food Insecurity: No Food Insecurity (07/05/2021)   Hunger Vital Sign    Worried  About Running Out of Food in the Last Year: Never true    Ran Out of Food in the Last Year: Never true  Transportation Needs: No Transportation Needs (07/05/2021)   PRAPARE - Administrator, Civil Service (Medical): No    Lack of Transportation (Non-Medical): No  Physical Activity: Not on file  Stress: No Stress Concern Present (07/05/2021)   [employer] of Occupational Health - Occupational Stress Questionnaire    Feeling of Stress : Only a little  Social Connections: Unknown (12/02/2021)   [facility] from [facility], [facility]   Social Network    Social Network: Not on file  Intimate Partner Violence: Unknown (10/22/2021)   [facility] from [facility]   HITS    Physically Hurt: Not on file    Insult or Talk Down To: Not on file    Threaten  Physical Harm: Not on file    Scream or Curse: Not on file     Review of Systems: General: negative for chills, fever, night sweats or weight changes.  Cardiovascular: negative for chest pain, dyspnea on exertion, edema, orthopnea, palpitations, paroxysmal nocturnal dyspnea or shortness of breath Dermatological: negative for rash Respiratory: negative for cough or wheezing Urologic: negative for hematuria Abdominal: negative for nausea, vomiting, diarrhea, bright red blood per rectum, melena, or hematemesis Neurologic: negative for visual changes, syncope, or dizziness All other systems reviewed and are otherwise negative except as noted above.    Blood pressure 122/68, pulse (!) 101, height 5\' 11"  (1.803 m), weight 259 lb (117.5 kg), SpO2 97%.  General appearance: alert and no distress Neck: no adenopathy, no carotid bruit, no JVD, supple, symmetrical, trachea midline, and thyroid not enlarged, symmetric, no tenderness/mass/nodules Lungs: clear to auscultation bilaterally Heart: regular rate and rhythm, S1, S2 normal, no murmur, click, rub or gallop Extremities: extremities normal, atraumatic, no cyanosis or edema Pulses: 2+ and symmetric Skin: Skin color, texture, turgor normal. No rashes or lesions Neurologic: Grossly normal  EKG EKG Interpretation Date/Time:  Friday October 02 2023 09:43:46 EDT Ventricular Rate:  101 PR Interval:  166 QRS Duration:  78 QT Interval:  342 QTC Calculation: 443 R Axis:   1  Text Interpretation: Sinus tachycardia with frequent Premature ventricular complexes Cannot rule out Inferior infarct , age undetermined When compared with ECG of 02-Sep-2021 08:46, PREVIOUS ECG IS PRESENT Confirmed by Nanetta Batty [id number]) on 10/02/2023 9:45:01 AM    ASSESSMENT AND PLAN:   Hypertension History of essential hypertension with blood pressure measured today at 122/68.  He is on losartan 50 mg a day.  His blood pressure at home is in the same range although  occasionally he is somewhat more hypotensive.  He does complain of fatigue.  I am going to decrease his losartan from 50 to 25 mg a day and have him keep a 30 blood pressure log.  He will come back in after that to have this reviewed make further changes.  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 Titus Dubin in 2022 with recent Doppler studies performed in June of last year revealing a patent stent with moderate left ICA stenosis.  Mixed hyperlipidemia History of hyperlipidemia on high-dose statin therapy with lipid profile performed by his PCP 1 month ago revealing a total cholesterol of 121, LDL 55 and HDL 42.  Obstructive sleep apnea History of obstructive sleep apnea  on CPAP     Runell Gess MD [facility] 10/02/2023 9:55 AM

## 2023-10-02 NOTE — Assessment & Plan Note (Signed)
 History of obstructive sleep apnea on CPAP.

## 2023-10-02 NOTE — Patient Instructions (Signed)
 Medication Instructions:  Your physician has recommended you make the following change in your medication:   -Decrease losartan (cozaar) to 25mg  once daily.  *If you need a refill on your cardiac medications before your next appointment, please call your pharmacy*   Testing/Procedures: Your physician has requested that you have an echocardiogram. Echocardiography is a painless test that uses sound waves to create images of your heart. It provides your doctor with information about the size and shape of your heart and how well your heart's chambers and valves are working. This procedure takes approximately one hour. There are no restrictions for this procedure. Please do NOT wear cologne, perfume, aftershave, or lotions (deodorant is allowed). Please arrive 15 minutes prior to your appointment time.  Please note: We ask at that you not bring children with you during ultrasound (echo/ vascular) testing. Due to room size and safety concerns, children are not allowed in the ultrasound rooms during exams. Our front office staff cannot provide observation of children in our lobby area while testing is being conducted. An adult accompanying a patient to their appointment will only be allowed in the ultrasound room at the discretion of the ultrasound technician under special circumstances. We apologize for any inconvenience.    Follow-Up: At [facility], you and your health needs are our priority.  As part of our continuing mission to provide you with exceptional heart care, we have created designated Provider Care Teams.  These Care Teams include your primary Cardiologist (physician) and Advanced Practice Providers (APPs -  Physician Assistants and Nurse Practitioners) who all work together to provide you with the care you need, when you need it.  We recommend signing up for the patient portal called "MyChart".  Sign up information is provided on this After Visit Summary.  MyChart is used to  connect with patients for Virtual Visits (Telemedicine).  Patients are able to view lab/test results, encounter notes, upcoming appointments, etc.  Non-urgent messages can be sent to your provider as well.   To learn more about what you can do with MyChart, go to [email or web address].    Your next appointment:   4 week(s)  Provider:   Robet Leu, PA-C, Azalee Course, PA-C, Bernadene Person, NP, Reather Littler, NP, or Rise Paganini, NP       Then, Nanetta Batty, MD will plan to see you again in 12 month(s).     Other Instructions Please keep a blood pressure log and bring this back with you to discuss at follow up office visit.   If you monitor your blood pressure (BP) at home, please bring your BP cuff and your BP readings with you to this appointment  HOW TO TAKE YOUR BLOOD PRESSURE: Rest 5 minutes before taking your blood pressure. Don't smoke or drink caffeinated beverages for at least 30 minutes before. Take your blood pressure before (not after) you eat. Sit comfortably with your back supported and both feet on the floor (don't cross your legs). Elevate your arm to heart level on a table or a desk. Use the proper sized cuff. It should fit smoothly and snugly around your bare upper arm. There should be enough room to slip a fingertip under the cuff. The bottom edge of the cuff should be 1 inch above the crease of the elbow. Ideally, take 3 measurements at one sitting and record the average.     1st Floor: - Lobby - Registration  - Pharmacy  - Lab - Cafe  2nd Floor: - PV  Lab - Diagnostic Testing (echo, CT, nuclear med)  3rd Floor: - Vacant  4th Floor: - TCTS (cardiothoracic surgery) - AFib Clinic - Structural Heart Clinic - Vascular Surgery  - Vascular Ultrasound  5th Floor: - HeartCare Cardiology (general and EP) - Clinical Pharmacy for coumadin, hypertension, lipid, weight-loss medications, and med management appointments    Valet parking services will be  available as well.

## 2023-10-02 NOTE — Assessment & Plan Note (Signed)
 History of sinus bradycardia in the past on beta-blockers currently not an issue

## 2023-10-14 ENCOUNTER — Ambulatory Visit (HOSPITAL_COMMUNITY)
Admission: RE | Admit: 2023-10-14 | Discharge: 2023-10-14 | Disposition: A | Discharge status: Home / Self Care | Source: Ambulatory Visit | Attending: Cardiovascular Disease | Admitting: Cardiovascular Disease

## 2023-10-14 DIAGNOSIS — R001 Bradycardia, unspecified: Secondary | ICD-10-CM | POA: Diagnosis not present

## 2023-10-14 DIAGNOSIS — E782 Mixed hyperlipidemia: Secondary | ICD-10-CM | POA: Diagnosis present

## 2023-10-14 DIAGNOSIS — G4733 Obstructive sleep apnea (adult) (pediatric): Secondary | ICD-10-CM | POA: Diagnosis present

## 2023-10-14 DIAGNOSIS — I10 Essential (primary) hypertension: Secondary | ICD-10-CM | POA: Insufficient documentation

## 2023-10-14 LAB — ECHOCARDIOGRAM COMPLETE
Area-P 1/2: 2.91 cm2
MV M vel: 5.45 m/s
MV Peak grad: 118.8 mmHg
S' Lateral: 3.1 cm

## 2023-10-14 NOTE — Progress Notes (Signed)
*  PRELIMINARY RESULTS* Echocardiogram 2D Echocardiogram has been performed.  Stacey Drain 10/14/2023, 12:42 PM

## 2023-10-26 NOTE — Progress Notes (Unsigned)
 Cardiology Office Note:    Date:  10/26/2023  ID:  Stephen Hull, DOB 1946/05/25, MRN [id number] PCP: Runell Gess, MD  [facility] Providers Cardiologist:  Nanetta Batty, MD { Click to update primary MD,subspecialty MD or APP then REFRESH:1}    {Click to Open Review  :1}   Patient Profile:      Chief Complaint: *** History of Present Illness:  Stephen Hull is a 78 y.o. male with visit-pertinent history of paroxysmal SVT, asymptomatic bradycardia, hypertension, hyperlipidemia, tobacco use (quit in 1991), carotid artery disease, obstructive sleep apnea on CPAP  Patient was referred to Dr. Allyson Sabal in 1991 for paroxysmal SVT.  Cardiac catheterization that time was normal, he was placed on beta-blocker.  He was not seen again until 2018 when he reestablished with Dr. Allyson Sabal for evaluation of asymptomatic bradycardia with a heart rate of 36 bpm that was found at PCP office.  During this OV patient noted checking his vital signs frequent at home and had not noticed any recurrent bradycardia.  His heart rate in office that day was 60 bpm therefore he was continued on his beta-blocker without medication changes.  He was seen by [facility], PA on 05/2021.  He was having some neurological symptoms.  A carotid Doppler was ordered at that time however the patient developed a stroke prior to the carotid Doppler study.  He presented with stroke type symptoms and underwent neurologic evaluation and imaging.  He had a subtotally occluded right internal carotid artery and ultimately underwent PTA and stenting by Dr. Link Snuffer complicated by distal emboli requiring further intracranial intervention.  Patient was last seen in office on 10/02/2023 by Dr. Allyson Sabal.  Patient noted to have fatigue and mild dyspnea on exertion.  His blood pressure is 122/68 in office however his home blood pressure measurements will occasionally be hypotensive.  His losartan was decreased from 50 to 25 mg a day.  He was to  follow-up back in 1 month.  Discussed the use of AI scribe software for clinical note transcription with the patient, who gave verbal consent to proceed.  History of Present Illness     Review of systems:  Please see the history of present illness. All other systems are reviewed and otherwise negative. ***     Home Medications:    No outpatient medications have been marked as taking for the 10/30/23 encounter (Appointment) with Stephen Robert, NP.   Studies Reviewed:       *** Risk Assessment/Calculations:   {Does this patient have ATRIAL FIBRILLATION?:[id number]} No BP recorded.  {Refresh Note OR Click here to enter BP  :1}***       Physical Exam:   VS:  There were no vitals taken for this visit.   Wt Readings from Last 3 Encounters:  10/02/23 259 lb (117.5 kg)  01/09/22 250 lb (113.4 kg)  11/07/21 238 lb 11.2 oz (108.3 kg)    GEN: Well nourished, well developed in no acute distress NECK: No JVD; No carotid bruits CARDIAC: ***RRR, no murmurs, rubs, gallops RESPIRATORY:  Clear to auscultation without rales, wheezing or rhonchi  ABDOMEN: Soft, non-tender, non-distended EXTREMITIES:  No edema; No acute deformity ***     Assessment and Plan:  Assessment and Plan Assessment & Plan      {Are you ordering a CV Procedure (e.g. stress test, cath, DCCV, TEE, etc)?   Press F2        :[id number]}  Dispo:  No follow-ups on file.  Signed, [employer]  Retta Mac, NP

## 2023-10-30 ENCOUNTER — Ambulatory Visit: Attending: Emergency Medicine | Admitting: Emergency Medicine

## 2023-10-30 ENCOUNTER — Encounter: Payer: Self-pay | Admitting: Emergency Medicine

## 2023-10-30 VITALS — BP 140/82 | HR 95 | Ht 71.0 in | Wt 259.8 lb

## 2023-10-30 DIAGNOSIS — I6523 Occlusion and stenosis of bilateral carotid arteries: Secondary | ICD-10-CM

## 2023-10-30 DIAGNOSIS — I10 Essential (primary) hypertension: Secondary | ICD-10-CM | POA: Diagnosis not present

## 2023-10-30 DIAGNOSIS — R001 Bradycardia, unspecified: Secondary | ICD-10-CM

## 2023-10-30 DIAGNOSIS — G4733 Obstructive sleep apnea (adult) (pediatric): Secondary | ICD-10-CM

## 2023-10-30 DIAGNOSIS — R5383 Other fatigue: Secondary | ICD-10-CM

## 2023-10-30 DIAGNOSIS — E782 Mixed hyperlipidemia: Secondary | ICD-10-CM

## 2023-10-30 DIAGNOSIS — I340 Nonrheumatic mitral (valve) insufficiency: Secondary | ICD-10-CM

## 2023-10-30 MED ORDER — LOSARTAN POTASSIUM 50 MG PO TABS: 50.0000 mg | ORAL_TABLET | Freq: Every day | ORAL | 2 refills | Status: AC

## 2023-10-30 NOTE — Patient Instructions (Signed)
 Medication Instructions:  INCREASE YOUR LOSARTAN TO 50 MG DAILY.   Lab Work: NONE   Testing/Procedures: NONE  Follow-Up: At [facility], you and your health needs are our priority.  As part of our continuing mission to provide you with exceptional heart care, our providers are all part of one team.  This team includes your primary Cardiologist (physician) and Advanced Practice Providers or APPs (Physician Assistants and Nurse Practitioners) who all work together to provide you with the care you need, when you need it.  Your next appointment:   6 month(s)  Provider:   DR. Allyson Sabal, MD [facility], DNP    Other Instructions:      1st Floor: - Lobby - Registration  - Pharmacy  - Lab - Cafe  2nd Floor: - PV Lab - Diagnostic Testing (echo, CT, nuclear med)  3rd Floor: - Vacant  4th Floor: - TCTS (cardiothoracic surgery) - AFib Clinic - Structural Heart Clinic - Vascular Surgery  - Vascular Ultrasound  5th Floor: - [facility] Cardiology (general and EP) - Clinical Pharmacy for coumadin, hypertension, lipid, weight-loss medications, and med management appointments    Valet parking services will be available as well.

## 2023-11-27 ENCOUNTER — Encounter (INDEPENDENT_AMBULATORY_CARE_PROVIDER_SITE_OTHER): Payer: Self-pay | Admitting: *Deleted

## 2024-01-16 ENCOUNTER — Encounter (HOSPITAL_COMMUNITY): Payer: Self-pay | Admitting: Interventional Radiology

## 2024-04-15 ENCOUNTER — Encounter: Payer: Self-pay | Admitting: *Deleted

## 2024-04-18 ENCOUNTER — Encounter: Payer: Self-pay | Admitting: Emergency Medicine

## 2024-04-18 ENCOUNTER — Ambulatory Visit: Attending: Emergency Medicine | Admitting: Emergency Medicine

## 2024-04-18 VITALS — BP 126/70 | HR 102 | Ht 71.0 in | Wt 260.8 lb

## 2024-04-18 DIAGNOSIS — E782 Mixed hyperlipidemia: Secondary | ICD-10-CM | POA: Diagnosis not present

## 2024-04-18 DIAGNOSIS — I10 Essential (primary) hypertension: Secondary | ICD-10-CM

## 2024-04-18 DIAGNOSIS — I6523 Occlusion and stenosis of bilateral carotid arteries: Secondary | ICD-10-CM | POA: Diagnosis not present

## 2024-04-18 DIAGNOSIS — I340 Nonrheumatic mitral (valve) insufficiency: Secondary | ICD-10-CM | POA: Diagnosis not present

## 2024-04-18 DIAGNOSIS — R001 Bradycardia, unspecified: Secondary | ICD-10-CM

## 2024-04-18 DIAGNOSIS — G4733 Obstructive sleep apnea (adult) (pediatric): Secondary | ICD-10-CM

## 2024-04-18 NOTE — Patient Instructions (Addendum)
 Medication Instructions:  NO CHANGES  Lab Work: FASTING LIPID PANEL AND CMET TO BE DONE TODAY.,  Testing/Procedures: Your physician has requested that you have a carotid duplex. This test is an ultrasound of the carotid arteries in your neck. It looks at blood flow through these arteries that supply the brain with blood. Allow one hour for this exam. There are no restrictions or special instructions.   Follow-Up: At [facility], you and your health needs are our priority.  As part of our continuing mission to provide you with exceptional heart care, our providers are all part of one team.  This team includes your primary Cardiologist (physician) and Advanced Practice Providers or APPs (Physician Assistants and Nurse Practitioners) who all work together to provide you with the care you need, when you need it.  Your next appointment:   1 year  Provider:   Dr. Court or [facility].

## 2024-04-18 NOTE — Progress Notes (Signed)
 Cardiology Office Note:    Date:  04/18/2024  ID:  Stephen DANNEMILLER, DOB 05/12/1946, MRN [id number] PCP: No primary care provider on file.  [facility] Providers Cardiologist:  Dorn Lesches, MD Cardiology APP:  Rana Lum CROME, NP       Patient Profile:       Chief Complaint: 44-month follow-up History of Present Illness:  Stephen Hull is a 78 y.o. male with visit-pertinent history of paroxysmal SVT, asymptomatic bradycardia, hypertension, hyperlipidemia, tobacco use (quit in 1991), carotid artery disease, obstructive sleep apnea on CPAP, stroke in 05/2021 s/p right ICA stenting and s/p right MCA thrombectomy, obesity   Patient was referred to Dr. Lesches in 1991 for paroxysmal SVT.  Cardiac catheterization that time was normal, he was placed on beta-blocker.  He was not seen again until 2018 when he reestablished with Dr. Lesches for evaluation of asymptomatic bradycardia with a heart rate of 36 bpm that was found at PCP office.  During this OV patient noted checking his vital signs frequent at home and had not noticed any recurrent bradycardia.  His heart rate in office that day was 60 bpm therefore he was continued on his beta-blocker without medication changes.   He was seen by [facility] on 05/2021.  He was having some neurological symptoms.  A carotid Doppler was ordered at that time however the patient developed a stroke prior to the carotid Doppler study.  He presented with stroke type symptoms and underwent neurologic evaluation and imaging.  He had a subtotally occluded right internal carotid artery and ultimately underwent PTA and stenting by Dr. Myrtis complicated by distal emboli requiring further intracranial intervention.   Patient was seen in office on 10/02/2023 by Dr. Lesches.  Patient noted to have fatigue and mild dyspnea on exertion.  His blood pressure is 122/68 in office however his home blood pressure measurements will occasionally be hypotensive.  His  losartan  was decreased from 50 to 25 mg a day.  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  He was last seen in the office on 10/30/2023.  He reports he has had no improvement in his fatigue with the lower dose of losartan .  His home blood pressures were averaging 140s over 80s.  His losartan  was increased back to 50 mg daily.  He was follow-up in 6 months.   Discussed the use of AI scribe software for clinical note transcription with the patient, who gave verbal consent to proceed.  History of Present Illness Stephen Hull is a 78 year old male with hypertension and carotid artery disease who presents for a cardiovascular follow-up.  Today patient is doing well.  He is without any acute cardiovascular concerns.  His home blood pressures are stable at 120/70 mmHg, and his heart rate is typically in the 90s.  He remains active bass fishing.  Activity is somewhat limited due to lower back pain for which he receives injections every 3 months.  He denies any exertional symptoms.  He has not experienced lightheadedness, dizziness, chest pain, shortness of breath, orthopnea, PND, syncope, or palpitations. His LDL cholesterol is well-controlled at 55 mg/dL as of early 7974.   Review of systems:  Please see the history of present illness. All other systems are reviewed and otherwise negative.      Studies Reviewed:  Echocardiogram 10/14/2023 1. Left ventricular ejection fraction, by estimation, is 60 to 65%. The  left ventricle has normal function. The left ventricle has no regional  wall motion abnormalities. Left ventricular diastolic parameters are  indeterminate.   2. Right ventricular systolic function is normal. The right ventricular  size is normal.   3. Left atrial size was moderately  dilated.   4. The mitral valve is abnormal. Mild mitral valve regurgitation,  eccentric anteriorly directed. No evidence of mitral stenosis. There is  mild holosystolic prolapse of posterior lealfet of the mitral valve.   5. The aortic valve is tricuspid. Aortic valve regurgitation is not  visualized. No aortic stenosis is present.   6. The inferior vena cava is normal in size with greater than 50%  respiratory variability, suggesting right atrial pressure of 3 mmHg.   Carotid duplex 01/07/2023 Right Carotid: Patent right ICA stent with mildly elevated velocities.   Left Carotid: Velocities in the left ICA are consistent with a 40-59%  stenosis.   Vertebrals: Left vertebral artery demonstrates antegrade flow. Right  vertebral              artery was not visualized.  Subclavians: Normal flow hemodynamics were seen in bilateral subclavian               arteries.  Risk Assessment/Calculations:              Physical Exam:   VS:  BP 126/70   Pulse (!) 102   Ht 5' 11 (1.803 m)   Wt 260 lb 12.8 oz (118.3 kg)   SpO2 95%   BMI 36.37 kg/m    Wt Readings from Last 3 Encounters:  04/18/24 260 lb 12.8 oz (118.3 kg)  10/30/23 259 lb 12.8 oz (117.8 kg)  10/02/23 259 lb (117.5 kg)    GEN: Well nourished, well developed in no acute distress NECK: No JVD; No carotid bruits CARDIAC: RRR, no murmurs, rubs, gallops RESPIRATORY:  Clear to auscultation without rales, wheezing or rhonchi  ABDOMEN: Soft, non-tender, non-distended EXTREMITIES:  No edema; No acute deformity      Assessment and Plan:  Hypertension Blood pressure today well-controlled at 126/70 Reports home blood pressure average 120s over 70s - Continue losartan  50 mg daily and doxazosin 1 mg daily   Carotid stenosis History of CVA and 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  Recent Doppler studies performed in June 2024 revealing patent stent with moderate  left ICA stenosis - Denies any new neurological symptoms - Plan for repeat carotid Dopplers for routine surveillance - Continue atorvastatin  80 mg daily   Bradycardia Remote history of sinus bradycardia in 2018 Monitors heart rate at home with Apple watch.  Notes average heart rate in the 90s - Well-controlled and asymptomatic.  No changes   Hyperlipidemia LDL 55, HDL 42, TG 161 on 02/7973 Well-controlled on current statin therapy - Continue atorvastatin  80 mg daily - Focus on heart healthy dieting - Repeat fasting lipid panel and LFTs today   Mild mitral valve regurgitation Echo 09/2023 showed mild MR with mild holosystolic prolapse of posterior leaflet - Recommend repeat echocardiogram in 3 to 5 years for routine monitoring  Obstructive sleep apnea - He remains adherent to CPAP therapy      Dispo:  Return in about 1 year (around 04/18/2025).  Signed, Lum LITTIE Louis, NP

## 2024-04-19 ENCOUNTER — Ambulatory Visit: Payer: Self-pay | Admitting: Emergency Medicine

## 2024-04-19 LAB — COMPREHENSIVE METABOLIC PANEL WITH GFR
ALT: 21 IU/L (ref 0–44)
AST: 19 IU/L (ref 0–40)
Albumin: 4.1 g/dL (ref 3.8–4.8)
Alkaline Phosphatase: 80 IU/L (ref 47–123)
BUN/Creatinine Ratio: 13 (ref 10–24)
BUN: 15 mg/dL (ref 8–27)
Bilirubin Total: 1 mg/dL (ref 0.0–1.2)
CO2: 21 mmol/L (ref 20–29)
Calcium: 9.1 mg/dL (ref 8.6–10.2)
Chloride: 102 mmol/L (ref 96–106)
Creatinine, Ser: 1.17 mg/dL (ref 0.76–1.27)
Globulin, Total: 2.6 g/dL (ref 1.5–4.5)
Glucose: 86 mg/dL (ref 70–99)
Potassium: 4.4 mmol/L (ref 3.5–5.2)
Sodium: 139 mmol/L (ref 134–144)
Total Protein: 6.7 g/dL (ref 6.0–8.5)
eGFR: 65 mL/min/1.73 (ref >59)

## 2024-04-19 LAB — LIPID PANEL
Chol/HDL Ratio: 2.8 ratio (ref 0.0–5.0)
Cholesterol, Total: 119 mg/dL (ref 100–199)
HDL: 43 mg/dL (ref >39)
LDL Chol Calc (NIH): 60 mg/dL (ref 0–99)
Triglycerides: 78 mg/dL (ref 0–149)
VLDL Cholesterol Cal: 16 mg/dL (ref 5–40)

## 2024-05-03 ENCOUNTER — Ambulatory Visit: Attending: Emergency Medicine

## 2024-05-03 DIAGNOSIS — I6523 Occlusion and stenosis of bilateral carotid arteries: Secondary | ICD-10-CM | POA: Diagnosis not present

## 2024-08-24 ENCOUNTER — Other Ambulatory Visit (HOSPITAL_COMMUNITY): Payer: Self-pay | Admitting: Sports Medicine

## 2024-08-24 DIAGNOSIS — M19012 Primary osteoarthritis, left shoulder: Secondary | ICD-10-CM

## 2024-09-12 ENCOUNTER — Ambulatory Visit (HOSPITAL_COMMUNITY)
